# Patient Record
Sex: Female | Born: 1935
Health system: Southern US, Community
[De-identification: ages and names within clinical notes are randomized; demographics above are authoritative.]

## PROBLEM LIST (undated history)

## (undated) DIAGNOSIS — M1990 Unspecified osteoarthritis, unspecified site: Secondary | ICD-10-CM

## (undated) DIAGNOSIS — K219 Gastro-esophageal reflux disease without esophagitis: Secondary | ICD-10-CM

## (undated) DIAGNOSIS — R918 Other nonspecific abnormal finding of lung field: Secondary | ICD-10-CM

## (undated) DIAGNOSIS — E785 Hyperlipidemia, unspecified: Secondary | ICD-10-CM

## (undated) DIAGNOSIS — G4700 Insomnia, unspecified: Secondary | ICD-10-CM

## (undated) DIAGNOSIS — D649 Anemia, unspecified: Secondary | ICD-10-CM

## (undated) DIAGNOSIS — I10 Essential (primary) hypertension: Secondary | ICD-10-CM

## (undated) DIAGNOSIS — E538 Deficiency of other specified B group vitamins: Secondary | ICD-10-CM

## (undated) DIAGNOSIS — J449 Chronic obstructive pulmonary disease, unspecified: Secondary | ICD-10-CM

## (undated) DIAGNOSIS — Z780 Asymptomatic menopausal state: Secondary | ICD-10-CM

## (undated) HISTORY — DX: Other nonspecific abnormal finding of lung field: R91.8

## (undated) HISTORY — DX: Anemia, unspecified: D64.9

## (undated) HISTORY — DX: Asymptomatic menopausal state: Z78.0

## (undated) HISTORY — DX: Unspecified osteoarthritis, unspecified site: M19.90

## (undated) HISTORY — DX: Gastro-esophageal reflux disease without esophagitis: K21.9

## (undated) HISTORY — PX: ABDOMINAL HYSTERECTOMY: SHX81

## (undated) HISTORY — DX: Deficiency of other specified B group vitamins: E53.8

## (undated) HISTORY — DX: Hyperlipidemia, unspecified: E78.5

## (undated) HISTORY — DX: Chronic obstructive pulmonary disease, unspecified: J44.9

## (undated) HISTORY — PX: BREAST BIOPSY: SHX20

## (undated) HISTORY — PX: BREAST EXCISIONAL BIOPSY: SUR124

## (undated) HISTORY — DX: Insomnia, unspecified: G47.00

## (undated) HISTORY — PX: CATARACT EXTRACTION, BILATERAL: SHX1313

---

## 2005-08-11 HISTORY — PX: CARDIOVASCULAR STRESS TEST: SHX262

## 2005-09-09 ENCOUNTER — Ambulatory Visit: Payer: Self-pay | Admitting: Internal Medicine

## 2006-03-10 ENCOUNTER — Ambulatory Visit: Payer: Self-pay | Admitting: Internal Medicine

## 2006-03-12 ENCOUNTER — Ambulatory Visit: Payer: Self-pay | Admitting: Cardiology

## 2006-03-19 ENCOUNTER — Ambulatory Visit: Payer: Self-pay

## 2006-03-27 ENCOUNTER — Ambulatory Visit: Payer: Self-pay | Admitting: Internal Medicine

## 2006-04-01 ENCOUNTER — Ambulatory Visit: Payer: Self-pay | Admitting: Emergency Medicine

## 2006-04-03 ENCOUNTER — Ambulatory Visit (HOSPITAL_COMMUNITY): Admission: RE | Admit: 2006-04-03 | Discharge: 2006-04-03 | Payer: Self-pay | Admitting: Emergency Medicine

## 2006-04-10 ENCOUNTER — Encounter (INDEPENDENT_AMBULATORY_CARE_PROVIDER_SITE_OTHER): Payer: Self-pay | Admitting: *Deleted

## 2006-04-10 ENCOUNTER — Ambulatory Visit: Payer: Self-pay | Admitting: Emergency Medicine

## 2006-04-10 ENCOUNTER — Ambulatory Visit (HOSPITAL_COMMUNITY): Admission: RE | Admit: 2006-04-10 | Discharge: 2006-04-10 | Payer: Self-pay | Admitting: Emergency Medicine

## 2006-04-10 ENCOUNTER — Encounter (INDEPENDENT_AMBULATORY_CARE_PROVIDER_SITE_OTHER): Payer: Self-pay | Admitting: Specialist

## 2006-04-21 ENCOUNTER — Encounter: Payer: Self-pay | Admitting: Internal Medicine

## 2006-05-11 HISTORY — PX: OTHER SURGICAL HISTORY: SHX169

## 2006-05-12 ENCOUNTER — Encounter (INDEPENDENT_AMBULATORY_CARE_PROVIDER_SITE_OTHER): Payer: Self-pay | Admitting: *Deleted

## 2006-05-12 ENCOUNTER — Inpatient Hospital Stay (HOSPITAL_COMMUNITY): Admission: RE | Admit: 2006-05-12 | Discharge: 2006-05-20 | Payer: Self-pay | Admitting: Thoracic Surgery

## 2006-05-12 ENCOUNTER — Ambulatory Visit: Payer: Self-pay | Admitting: Critical Care Medicine

## 2006-05-27 ENCOUNTER — Encounter: Admission: RE | Admit: 2006-05-27 | Discharge: 2006-05-27 | Payer: Self-pay | Admitting: Thoracic Surgery

## 2006-06-03 ENCOUNTER — Encounter: Admission: RE | Admit: 2006-06-03 | Discharge: 2006-06-03 | Payer: Self-pay | Admitting: Thoracic Surgery

## 2006-06-23 ENCOUNTER — Encounter: Admission: RE | Admit: 2006-06-23 | Discharge: 2006-06-23 | Payer: Self-pay | Admitting: Thoracic Surgery

## 2006-07-06 ENCOUNTER — Ambulatory Visit: Payer: Self-pay | Admitting: Internal Medicine

## 2006-08-19 ENCOUNTER — Encounter: Admission: RE | Admit: 2006-08-19 | Discharge: 2006-08-19 | Payer: Self-pay | Admitting: Thoracic Surgery

## 2006-11-17 ENCOUNTER — Ambulatory Visit: Payer: Self-pay | Admitting: Internal Medicine

## 2006-11-17 LAB — CONVERTED CEMR LAB
Basophils Absolute: 0 10*3/uL (ref 0.0–0.1)
Calcium: 9.7 mg/dL (ref 8.4–10.5)
Chloride: 108 meq/L (ref 96–112)
Eosinophils Absolute: 0.3 10*3/uL (ref 0.0–0.6)
GFR calc Af Amer: 157 mL/min
GFR calc non Af Amer: 130 mL/min
Lymphocytes Relative: 16.7 % (ref 12.0–46.0)
MCHC: 34 g/dL (ref 30.0–36.0)
MCV: 87.1 fL (ref 78.0–100.0)
Neutro Abs: 10.5 10*3/uL — ABNORMAL HIGH (ref 1.4–7.7)
Platelets: 331 10*3/uL (ref 150–400)
RBC: 4.93 M/uL (ref 3.87–5.11)
Sodium: 144 meq/L (ref 135–145)
Vitamin B-12: 354 pg/mL (ref 211–911)

## 2006-12-02 ENCOUNTER — Encounter: Admission: RE | Admit: 2006-12-02 | Discharge: 2006-12-02 | Payer: Self-pay | Admitting: Thoracic Surgery

## 2006-12-02 ENCOUNTER — Ambulatory Visit: Payer: Self-pay | Admitting: Thoracic Surgery

## 2006-12-04 ENCOUNTER — Ambulatory Visit: Payer: Self-pay | Admitting: Internal Medicine

## 2006-12-28 ENCOUNTER — Encounter: Payer: Self-pay | Admitting: Internal Medicine

## 2006-12-28 ENCOUNTER — Ambulatory Visit: Payer: Self-pay | Admitting: Internal Medicine

## 2007-03-20 ENCOUNTER — Encounter: Payer: Self-pay | Admitting: Internal Medicine

## 2007-04-05 ENCOUNTER — Ambulatory Visit: Payer: Self-pay | Admitting: Internal Medicine

## 2007-04-05 DIAGNOSIS — R930 Abnormal findings on diagnostic imaging of skull and head, not elsewhere classified: Secondary | ICD-10-CM | POA: Insufficient documentation

## 2007-04-05 DIAGNOSIS — G4700 Insomnia, unspecified: Secondary | ICD-10-CM | POA: Insufficient documentation

## 2007-04-06 ENCOUNTER — Ambulatory Visit: Payer: Self-pay | Admitting: Internal Medicine

## 2007-04-07 LAB — CONVERTED CEMR LAB
Basophils Relative: 0.7 % (ref 0.0–1.0)
CO2: 30 meq/L (ref 19–32)
Calcium: 9.5 mg/dL (ref 8.4–10.5)
Eosinophils Absolute: 0.2 10*3/uL (ref 0.0–0.6)
Eosinophils Relative: 3.6 % (ref 0.0–5.0)
GFR calc Af Amer: 127 mL/min
GFR calc non Af Amer: 105 mL/min
Glucose, Bld: 93 mg/dL (ref 70–99)
HDL: 36.6 mg/dL — ABNORMAL LOW (ref >39.0)
Hemoglobin: 15 g/dL (ref 12.0–15.0)
Lymphocytes Relative: 31.8 % (ref 12.0–46.0)
MCV: 87.2 fL (ref 78.0–100.0)
Monocytes Absolute: 0.5 10*3/uL (ref 0.2–0.7)
Neutro Abs: 3.9 10*3/uL (ref 1.4–7.7)
Neutrophils Relative %: 56.9 % (ref 43.0–77.0)
Platelets: 332 10*3/uL (ref 150–400)
Potassium: 4.6 meq/L (ref 3.5–5.1)
Sodium: 141 meq/L (ref 135–145)
WBC: 6.7 10*3/uL (ref 4.5–10.5)

## 2007-04-29 ENCOUNTER — Encounter: Admission: RE | Admit: 2007-04-29 | Discharge: 2007-04-29 | Payer: Self-pay | Admitting: Internal Medicine

## 2007-04-29 ENCOUNTER — Encounter: Payer: Self-pay | Admitting: Internal Medicine

## 2007-05-03 IMAGING — CR DG CHEST 2V
2 series · 2 of 2 positions shown · non-contrast
Comparison: 05/27/06.

CLINICAL DATA: Status post surgery for lung mass.  
 TWO VIEW CHEST:

[w chest pa]
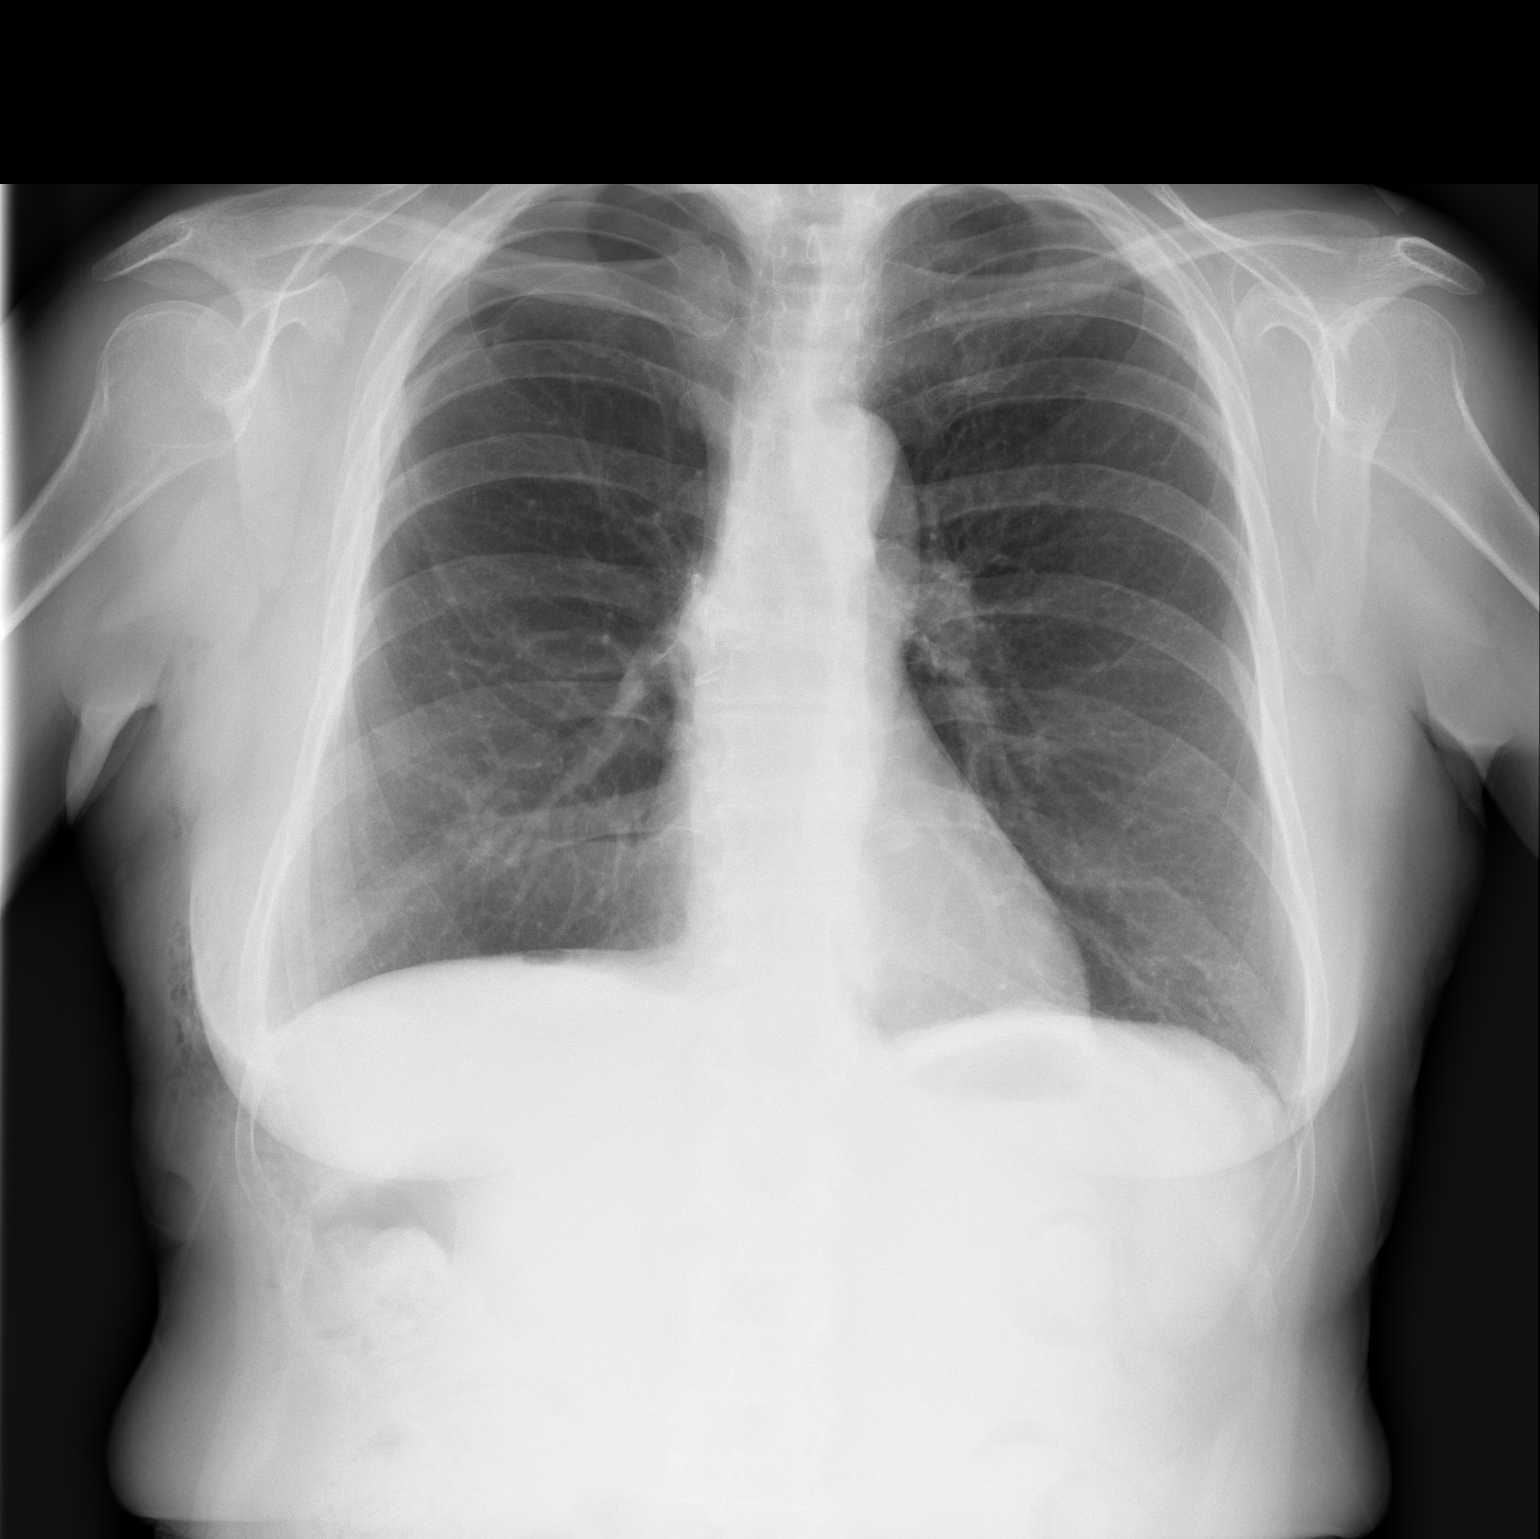

[w chest lat]
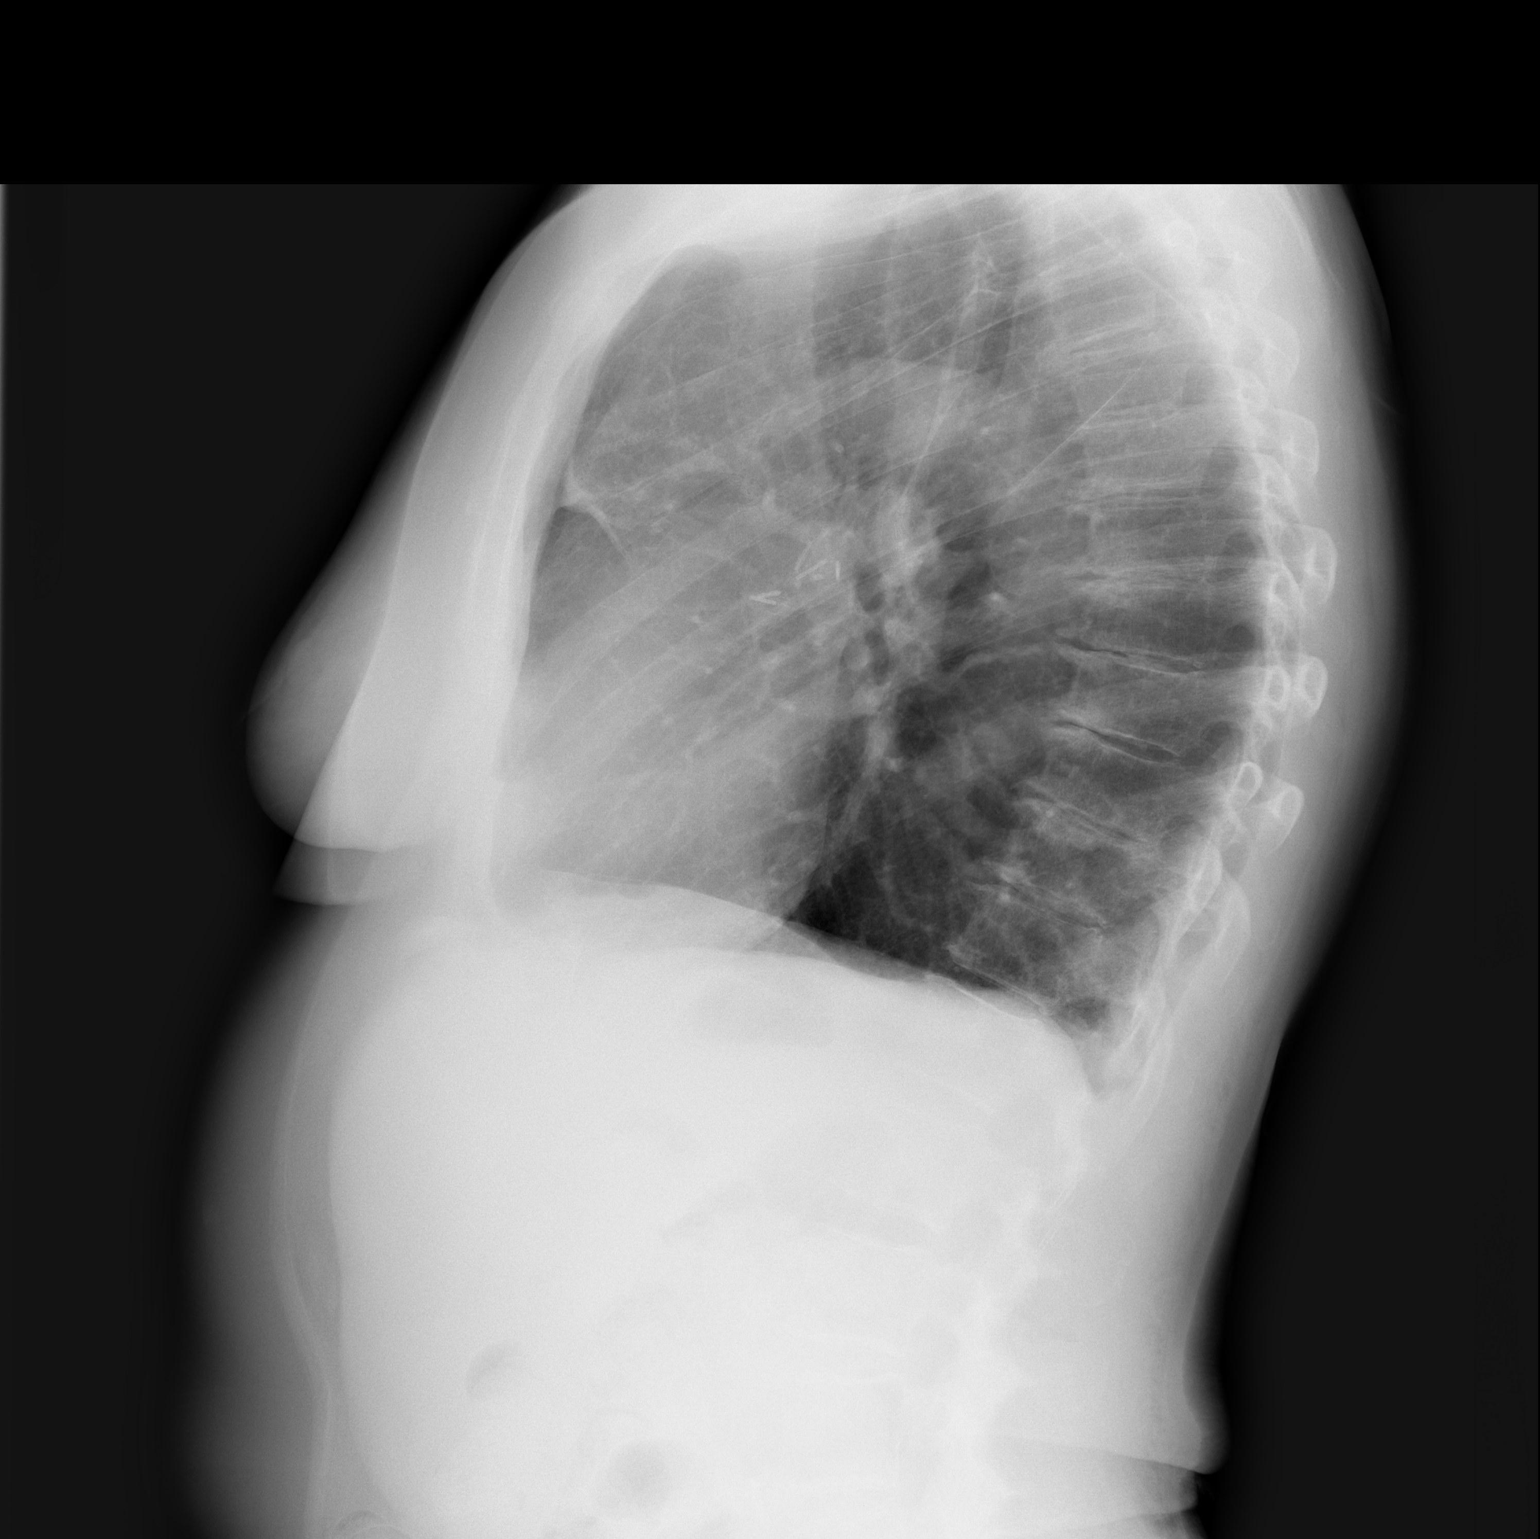

[2 of 2 positions shown; findings below may reference images not displayed]

FINDINGS: Hyperinflation likely relates to COPD.  
 Midline trachea.  Heart size normal.  Left lung clear.  Right apical pneumothorax is decreased in size today.  Visceral pleural line measures maximally 2.6cm from the chest wall today compared to 4.3 at a similar level previously.  There is estimated approximately 10 ? 15% pneumothorax.  There may be a small amount of subpulmonic air.  Decreased right sided subcutaneous air.  Trace right sided pleural effusion or thickening.
IMPRESSION: 1.  Decreased size of a right apical pneumothorax, now estimated at 10 ? 15%.  No mediastinal shift.  Decreased right sided subcutaneous air. 
 2.  COPD.

## 2007-05-05 ENCOUNTER — Encounter: Admission: RE | Admit: 2007-05-05 | Discharge: 2007-05-05 | Payer: Self-pay | Admitting: Internal Medicine

## 2007-05-05 ENCOUNTER — Encounter: Payer: Self-pay | Admitting: Internal Medicine

## 2007-06-07 ENCOUNTER — Ambulatory Visit: Payer: Self-pay | Admitting: Internal Medicine

## 2007-06-07 DIAGNOSIS — M810 Age-related osteoporosis without current pathological fracture: Secondary | ICD-10-CM | POA: Insufficient documentation

## 2007-08-11 ENCOUNTER — Ambulatory Visit: Payer: Self-pay | Admitting: Gastroenterology

## 2007-09-10 ENCOUNTER — Telehealth: Payer: Self-pay | Admitting: Internal Medicine

## 2007-10-06 ENCOUNTER — Ambulatory Visit: Payer: Self-pay | Admitting: Internal Medicine

## 2007-10-06 DIAGNOSIS — M1990 Unspecified osteoarthritis, unspecified site: Secondary | ICD-10-CM

## 2007-10-06 DIAGNOSIS — E785 Hyperlipidemia, unspecified: Secondary | ICD-10-CM

## 2007-10-08 LAB — CONVERTED CEMR LAB
Direct LDL: 173.3 mg/dL
Total CHOL/HDL Ratio: 5.3
Triglycerides: 126 mg/dL (ref 0–149)
VLDL: 25 mg/dL (ref 0–40)

## 2007-11-05 ENCOUNTER — Ambulatory Visit: Payer: Self-pay | Admitting: Internal Medicine

## 2007-11-05 DIAGNOSIS — D518 Other vitamin B12 deficiency anemias: Secondary | ICD-10-CM

## 2008-03-02 ENCOUNTER — Ambulatory Visit: Payer: Self-pay | Admitting: Internal Medicine

## 2008-03-03 ENCOUNTER — Encounter: Payer: Self-pay | Admitting: Internal Medicine

## 2008-03-03 LAB — CONVERTED CEMR LAB
Cholesterol: 233 mg/dL (ref 0–200)
Total CHOL/HDL Ratio: 5.8
Triglycerides: 87 mg/dL (ref 0–149)

## 2008-03-06 ENCOUNTER — Ambulatory Visit: Payer: Self-pay | Admitting: Internal Medicine

## 2008-03-13 ENCOUNTER — Encounter (INDEPENDENT_AMBULATORY_CARE_PROVIDER_SITE_OTHER): Payer: Self-pay | Admitting: *Deleted

## 2008-03-21 ENCOUNTER — Ambulatory Visit: Payer: Self-pay | Admitting: Internal Medicine

## 2008-04-04 ENCOUNTER — Ambulatory Visit: Payer: Self-pay | Admitting: Internal Medicine

## 2008-04-04 ENCOUNTER — Encounter: Payer: Self-pay | Admitting: Internal Medicine

## 2008-04-05 ENCOUNTER — Encounter: Payer: Self-pay | Admitting: Internal Medicine

## 2008-04-28 ENCOUNTER — Ambulatory Visit: Payer: Self-pay | Admitting: Internal Medicine

## 2008-05-11 ENCOUNTER — Encounter (INDEPENDENT_AMBULATORY_CARE_PROVIDER_SITE_OTHER): Payer: Self-pay | Admitting: *Deleted

## 2008-05-24 ENCOUNTER — Telehealth (INDEPENDENT_AMBULATORY_CARE_PROVIDER_SITE_OTHER): Payer: Self-pay | Admitting: *Deleted

## 2008-06-27 ENCOUNTER — Telehealth (INDEPENDENT_AMBULATORY_CARE_PROVIDER_SITE_OTHER): Payer: Self-pay | Admitting: *Deleted

## 2008-06-30 ENCOUNTER — Telehealth (INDEPENDENT_AMBULATORY_CARE_PROVIDER_SITE_OTHER): Payer: Self-pay | Admitting: *Deleted

## 2008-06-30 ENCOUNTER — Ambulatory Visit: Payer: Self-pay | Admitting: Internal Medicine

## 2008-06-30 ENCOUNTER — Encounter: Admission: RE | Admit: 2008-06-30 | Discharge: 2008-06-30 | Payer: Self-pay | Admitting: Internal Medicine

## 2008-07-01 LAB — CONVERTED CEMR LAB
BUN: 15 mg/dL (ref 6–23)
Basophils Absolute: 0 10*3/uL (ref 0.0–0.1)
Basophils Relative: 0.5 % (ref 0.0–3.0)
Chloride: 105 meq/L (ref 96–112)
Creatinine, Ser: 0.7 mg/dL (ref 0.4–1.2)
Glucose, Bld: 97 mg/dL (ref 70–99)
HCT: 43.4 % (ref 36.0–46.0)
Hemoglobin: 14.8 g/dL (ref 12.0–15.0)
Lymphocytes Relative: 30.3 % (ref 12.0–46.0)
MCHC: 34.1 g/dL (ref 30.0–36.0)
Monocytes Absolute: 0.4 10*3/uL (ref 0.1–1.0)
Neutro Abs: 4.1 10*3/uL (ref 1.4–7.7)
Potassium: 3.7 meq/L (ref 3.5–5.1)
RBC: 4.87 M/uL (ref 3.87–5.11)
RDW: 12.3 % (ref 11.5–14.6)
Vitamin B-12: 566 pg/mL (ref 211–911)

## 2008-08-07 ENCOUNTER — Encounter (INDEPENDENT_AMBULATORY_CARE_PROVIDER_SITE_OTHER): Payer: Self-pay | Admitting: *Deleted

## 2008-10-20 ENCOUNTER — Ambulatory Visit: Payer: Self-pay | Admitting: Internal Medicine

## 2008-11-15 ENCOUNTER — Ambulatory Visit: Payer: Self-pay | Admitting: Internal Medicine

## 2009-04-19 ENCOUNTER — Telehealth (INDEPENDENT_AMBULATORY_CARE_PROVIDER_SITE_OTHER): Payer: Self-pay | Admitting: *Deleted

## 2009-05-25 ENCOUNTER — Ambulatory Visit: Payer: Self-pay | Admitting: Internal Medicine

## 2009-05-30 ENCOUNTER — Encounter: Payer: Self-pay | Admitting: Internal Medicine

## 2009-05-30 ENCOUNTER — Ambulatory Visit: Payer: Self-pay | Admitting: Internal Medicine

## 2009-05-31 LAB — CONVERTED CEMR LAB
Cholesterol: 249 mg/dL — ABNORMAL HIGH (ref 0–200)
Direct LDL: 192.5 mg/dL
Total CHOL/HDL Ratio: 6
Triglycerides: 149 mg/dL (ref 0.0–149.0)
VLDL: 29.8 mg/dL (ref 0.0–40.0)

## 2009-06-04 ENCOUNTER — Telehealth: Payer: Self-pay | Admitting: Internal Medicine

## 2009-07-11 ENCOUNTER — Encounter: Payer: Self-pay | Admitting: Internal Medicine

## 2009-07-13 ENCOUNTER — Ambulatory Visit: Payer: Self-pay | Admitting: Internal Medicine

## 2009-07-16 ENCOUNTER — Encounter: Payer: Self-pay | Admitting: Internal Medicine

## 2009-07-17 ENCOUNTER — Telehealth (INDEPENDENT_AMBULATORY_CARE_PROVIDER_SITE_OTHER): Payer: Self-pay | Admitting: *Deleted

## 2009-07-17 ENCOUNTER — Ambulatory Visit: Payer: Self-pay | Admitting: Family

## 2009-07-27 ENCOUNTER — Telehealth: Payer: Self-pay | Admitting: Family

## 2009-07-27 ENCOUNTER — Encounter (HOSPITAL_COMMUNITY): Admission: RE | Admit: 2009-07-27 | Discharge: 2009-08-10 | Payer: Self-pay | Admitting: Internal Medicine

## 2009-07-31 ENCOUNTER — Ambulatory Visit: Payer: Self-pay | Admitting: Internal Medicine

## 2009-08-06 ENCOUNTER — Encounter (INDEPENDENT_AMBULATORY_CARE_PROVIDER_SITE_OTHER): Payer: Self-pay | Admitting: *Deleted

## 2009-08-06 LAB — CONVERTED CEMR LAB
ALT: 17 units/L (ref 0–35)
AST: 21 units/L (ref 0–37)
LDL Cholesterol: 96 mg/dL (ref 0–99)
Total CHOL/HDL Ratio: 4
VLDL: 19.2 mg/dL (ref 0.0–40.0)

## 2009-10-05 ENCOUNTER — Ambulatory Visit: Payer: Self-pay | Admitting: Family

## 2009-10-05 ENCOUNTER — Telehealth (INDEPENDENT_AMBULATORY_CARE_PROVIDER_SITE_OTHER): Payer: Self-pay | Admitting: *Deleted

## 2009-10-18 ENCOUNTER — Ambulatory Visit (HOSPITAL_BASED_OUTPATIENT_CLINIC_OR_DEPARTMENT_OTHER): Admission: RE | Admit: 2009-10-18 | Discharge: 2009-10-18 | Payer: Self-pay | Admitting: Internal Medicine

## 2009-10-18 ENCOUNTER — Ambulatory Visit: Payer: Self-pay | Admitting: Diagnostic Radiology

## 2009-11-23 ENCOUNTER — Ambulatory Visit (HOSPITAL_BASED_OUTPATIENT_CLINIC_OR_DEPARTMENT_OTHER): Admission: RE | Admit: 2009-11-23 | Discharge: 2009-11-23 | Payer: Self-pay | Admitting: Internal Medicine

## 2009-11-23 ENCOUNTER — Ambulatory Visit: Payer: Self-pay | Admitting: Internal Medicine

## 2009-11-23 ENCOUNTER — Ambulatory Visit: Payer: Self-pay | Admitting: Radiology

## 2009-11-26 LAB — CONVERTED CEMR LAB
ALT: 21 units/L (ref 0–35)
AST: 22 units/L (ref 0–37)
BUN: 12 mg/dL (ref 6–23)
Basophils Absolute: 0.2 10*3/uL — ABNORMAL HIGH (ref 0.0–0.1)
Folate: 17.9 ng/mL
GFR calc non Af Amer: 104.27 mL/min (ref >60)
Lymphocytes Relative: 30.5 % (ref 12.0–46.0)
Lymphs Abs: 2.3 10*3/uL (ref 0.7–4.0)
Monocytes Relative: 6.7 % (ref 3.0–12.0)
Neutrophils Relative %: 58 % (ref 43.0–77.0)
Platelets: 369 10*3/uL (ref 150.0–400.0)
Potassium: 4.2 meq/L (ref 3.5–5.1)
RDW: 14.6 % (ref 11.5–14.6)
Sodium: 140 meq/L (ref 135–145)
Vitamin B-12: 600 pg/mL (ref 211–911)
WBC: 7.6 10*3/uL (ref 4.5–10.5)

## 2009-12-14 ENCOUNTER — Telehealth: Payer: Self-pay | Admitting: Internal Medicine

## 2010-05-29 ENCOUNTER — Telehealth: Payer: Self-pay | Admitting: Internal Medicine

## 2010-05-29 ENCOUNTER — Ambulatory Visit: Payer: Self-pay | Admitting: Internal Medicine

## 2010-05-29 DIAGNOSIS — J309 Allergic rhinitis, unspecified: Secondary | ICD-10-CM | POA: Insufficient documentation

## 2010-06-28 ENCOUNTER — Ambulatory Visit: Payer: Self-pay | Admitting: Internal Medicine

## 2010-07-01 LAB — CONVERTED CEMR LAB
AST: 17 units/L (ref 0–37)
CO2: 28 meq/L (ref 19–32)
Chloride: 104 meq/L (ref 96–112)
Cholesterol: 252 mg/dL — ABNORMAL HIGH (ref 0–200)
Creatinine, Ser: 0.6 mg/dL (ref 0.4–1.2)
Direct LDL: 187.6 mg/dL
Total CHOL/HDL Ratio: 5

## 2010-07-10 ENCOUNTER — Telehealth: Payer: Self-pay | Admitting: Internal Medicine

## 2010-07-11 ENCOUNTER — Ambulatory Visit (HOSPITAL_COMMUNITY)
Admission: RE | Admit: 2010-07-11 | Discharge: 2010-07-11 | Payer: Self-pay | Source: Home / Self Care | Admitting: Internal Medicine

## 2010-07-15 ENCOUNTER — Telehealth (INDEPENDENT_AMBULATORY_CARE_PROVIDER_SITE_OTHER): Payer: Self-pay | Admitting: *Deleted

## 2010-07-26 ENCOUNTER — Telehealth (INDEPENDENT_AMBULATORY_CARE_PROVIDER_SITE_OTHER): Payer: Self-pay | Admitting: *Deleted

## 2010-07-30 ENCOUNTER — Telehealth: Payer: Self-pay | Admitting: Internal Medicine

## 2010-09-05 ENCOUNTER — Ambulatory Visit
Admission: RE | Admit: 2010-09-05 | Discharge: 2010-09-05 | Payer: Self-pay | Source: Home / Self Care | Attending: Internal Medicine | Admitting: Internal Medicine

## 2010-09-05 ENCOUNTER — Encounter: Payer: Self-pay | Admitting: Internal Medicine

## 2010-09-05 ENCOUNTER — Other Ambulatory Visit: Payer: Self-pay | Admitting: Internal Medicine

## 2010-09-05 ENCOUNTER — Ambulatory Visit (HOSPITAL_BASED_OUTPATIENT_CLINIC_OR_DEPARTMENT_OTHER)
Admission: RE | Admit: 2010-09-05 | Discharge: 2010-09-05 | Payer: Self-pay | Source: Home / Self Care | Attending: Internal Medicine | Admitting: Internal Medicine

## 2010-09-05 DIAGNOSIS — J441 Chronic obstructive pulmonary disease with (acute) exacerbation: Secondary | ICD-10-CM | POA: Insufficient documentation

## 2010-09-05 LAB — CBC WITH DIFFERENTIAL/PLATELET
Basophils Relative: 0.9 % (ref 0.0–3.0)
Eosinophils Absolute: 0.3 10*3/uL (ref 0.0–0.7)
HCT: 42.4 % (ref 36.0–46.0)
Hemoglobin: 14.4 g/dL (ref 12.0–15.0)
Lymphs Abs: 2.1 10*3/uL (ref 0.7–4.0)
MCHC: 33.9 g/dL (ref 30.0–36.0)
MCV: 87.1 fl (ref 78.0–100.0)
Monocytes Absolute: 0.6 10*3/uL (ref 0.1–1.0)
Neutro Abs: 4.5 10*3/uL (ref 1.4–7.7)
RBC: 4.86 Mil/uL (ref 3.87–5.11)

## 2010-09-05 LAB — AST: AST: 21 U/L (ref 0–37)

## 2010-09-05 LAB — LIPID PANEL: HDL: 42.5 mg/dL (ref >39.00)

## 2010-09-05 LAB — ALT: ALT: 17 U/L (ref 0–35)

## 2010-09-10 NOTE — Progress Notes (Signed)
Summary: refill   Phone Note Refill Request Message from:  Fax from Pharmacy on Dec 14, 2009 8:42 AM  Refills Requested: Medication #1:  CLORAZEPATE DIPOTASSIUM 3.75 MG  TABS 1 or two at bedtime as needed   Notes: no appt pending fax from [facility] - phone [phone]- fax [phone]   Method Requested: Fax to Local Pharmacy Initial call taken by: Okey Regal Spring,  Dec 14, 2009 8:43 AM  Follow-up for Phone Call        denied, got 60x3 11-23-09 Follow-up by: [facility] E. Paz MD,  Dec 14, 2009 9:54 AM  Additional Follow-up for Phone Call Additional follow up Details #1::        pharmacy aware Shary Decamp  Dec 14, 2009 11:01 AM

## 2010-09-10 NOTE — Assessment & Plan Note (Signed)
Summary: CPX/NS/KDC   Vital Signs:  Patient profile:   76 year old female Height:      64.25 inches Weight:      138 pounds BMI:     23.59 Pulse rate:   86 / minute BP sitting:   142 / 90  Vitals Entered By: Shary Decamp (November 23, 2009 9:09 AM) CC: yearly - fasting   History of Present Illness: h/o LUNG MASS -- no routine f/u w/ surgery    COPD-- no cough but SOB sometimes ; SOB is mostly when she starts walking, once she"push through" she can keep walking w/o problmes  Osteoporosis-- s/p reclast 12-10, on vit D   Hyperlipidemia-- took simvastatin from 10-10 to early 2011, self d/c  "I felt bad" (at the time she had persisten cough-URI symptoms , no myalgias per se)  ANEMIA, B12 DEFICIENCY-- gets B12 shot q 2-3 months (gets elsewhere)     yearly checkup, chart reviewed    Current Medications (verified): 1)  Aspir-Low 81 Mg  Tbec (Aspirin) .Marland Kitchen.. 1 By Mouth Once Daily 2)  Clorazepate Dipotassium 3.75 Mg  Tabs (Clorazepate Dipotassium) .Marland Kitchen.. 1 or Two At Bedtime As Needed 3)  Calcium and Vit D Everyday 4)  Fish Oil 1000 Mg Caps (Omega-3 Fatty Acids) .... Take 1 Capsule By Mouth Once A Day 5)  Daily Multi  Tabs (Multiple Vitamins-Minerals) .... 1/2 Tablet Once A Day 6)  Vitamin D3 1000 Unit Tabs (Cholecalciferol) .... Take 1 Tablet By Mouth Once A Day 7)  Vitamin E 400 Unit Caps (Vitamin E) .... Take 1 Tablet By Mouth Once A Day 8)  Mg 250mg   Allergies (verified): 1)  Levaquin  Past History:  Past Medical History: LUNG MASS s/p excision of RUL (Benign) R lung base nodularity , stable per last CXR 5-08 COPD Osteoporosis Osteoarthritis Hyperlipidemia ANEMIA, B12 DEFICIENCY MENOPAUSE, SURGICAL  INSOMNIA, CHRONIC, MILD  h/o COLONIC POLYPS (ICD-211.3) 03-2006 neg cardiolite  Past Surgical History: benign lesion from lung excised 10/07 COMPLETE HYSTERECTOMY 76 Y/O PFTS breast bx , remotely, neg   Family History: MI--father died at age 75 from a heart  attack dementia--M DM--no strokes--no colon ca--no breast ca--M    Social History: Reviewed history from 11/15/2008 and no changes required. Divorced lives by herself has two children, 4 G-kids all boys  totally independent on her ADL  ETOH-- socially Tobacco-- quit in the 37, used to smoke < 1ppd   Review of Systems General:  Denies fever and weight loss. CV:  Denies chest pain or discomfort and swelling of feet. Resp:  Denies coughing up blood and sputum productive. GI:  Denies bloody stools, diarrhea, nausea, and vomiting. GU:  Denies discharge and hematuria.  Physical Exam  General:  alert, well-developed, and well-nourished.   Neck:  no masses and no thyromegaly.   Breasts:  No mass, nodules, thickening, tenderness, bulging, retraction, inflamation, nipple discharge or skin changes noted.   Lungs:  normal respiratory effort, no intercostal retractions, no accessory muscle use, and normal breath sounds.   Heart:  normal rate, regular rhythm, no murmur, and no gallop.   Abdomen:  soft, non-tender, no distention, and no masses.   Extremities:  no edema Psych:  Cognition and judgment appear intact. Alert and cooperative with normal attention span and concentration.  not anxious appearing and not depressed appearing.     Impression & Recommendations:  Problem # 1:  OSTEOPOROSIS (ICD-733.00)  check vitamin D status-post re andclast  infusion 07/2009 Her updated medication list  for this problem includes:    Vitamin D3 1000 Unit Tabs (Cholecalciferol) .Marland Kitchen... Take 1 tablet by mouth once a day  Orders: Venipuncture (04540) T-Vitamin D (25-Hydroxy) (98119-14782)  Problem # 2:  HEALTH MAINTENANCE EXAM (ICD-V70.0) Td 2008 pneumonia shot 2008 had the  shingles shot already   MMG 3-11 neg   does  SBE sometimes  last PAP long ago, has not seen a gyn in a while, her previous doctor told her she does not need PAPs anymore. No h/o abnormal PAPs   Cscope 03-2008 Dr Juanda Chance 2  polyps (Hyperplastic and tubular adenoma)   diet and daily exercise recommended  Problem # 3:  HYPERLIPIDEMIA (ICD-272.4)  she took simvastatin for several weeks, self discontinued it due to respiratory symptoms, doubt they were d/t statins ( she had a  prolonged URI) Recommend to restart simvastatin   Labs Reviewed: SGOT: 21 (07/31/2009)   SGPT: 17 (07/31/2009)   HDL:39.40 (07/31/2009), 40.90 (05/25/2009)  LDL:96 (07/31/2009), DEL (95/62/1308)  Chol:155 (07/31/2009), 249 (05/25/2009)  Trig:96.0 (07/31/2009), 149.0 (05/25/2009)  Her updated medication list for this problem includes:    Simvastatin 20 Mg Tabs (Simvastatin) .Marland Kitchen... As directed  Orders: TLB-BMP (Basic Metabolic Panel-BMET) (80048-METABOL) TLB-ALT (SGPT) (84460-ALT) TLB-AST (SGOT) (84450-SGOT)  Problem # 4:  CHEST XRAY, ABNORMAL (ICD-793.1)  history of a lung mass, former smoker. check a chest x-ray  Orders: T-2 View CXR (71020TC)  Problem # 5:  ANEMIA, B12 DEFICIENCY (ICD-281.1)  she gets B12 shots elsewhere every two to 3 months, labs  Orders: TLB-CBC Platelet - w/Differential (85025-CBCD) TLB-B12 + Folate Pnl (65784_69629-B28/UXL)  Problem # 6:  ELEVATED BLOOD PRESSURE WITHOUT DIAGNOSIS OF HYPERTENSION (ICD-796.2) BP elevated in the last two office visits, seen instructions  Complete Medication List: 1)  Aspir-low 81 Mg Tbec (Aspirin) .Marland Kitchen.. 1 by mouth once daily 2)  Clorazepate Dipotassium 3.75 Mg Tabs (Clorazepate dipotassium) .Marland Kitchen.. 1 or two at bedtime as needed 3)  Calcium and Vit D Everyday  4)  Fish Oil 1000 Mg Caps (Omega-3 fatty acids) .... Take 1 capsule by mouth once a day 5)  Daily Multi Tabs (Multiple vitamins-minerals) .... 1/2 tablet once a day 6)  Vitamin D3 1000 Unit Tabs (Cholecalciferol) .... Take 1 tablet by mouth once a day 7)  Vitamin E 400 Unit Caps (Vitamin e) .... Take 1 tablet by mouth once a day 8)  Mg 250mg   9)  Simvastatin 20 Mg Tabs (Simvastatin) .... As directed  Patient  Instructions: 1)  Please schedule a follow-up appointment in 6 months .  2)  Check your blood pressure 2 or 3 times a week. If it is more than 140/85 consistently,please let us know  Prescriptions: SIMVASTATIN 20 MG TABS (SIMVASTATIN) as directed  #90 x 1   Entered and Authorized by:   Elita Quick E. Anola Mcgough MD   Signed by:   Nolon Rod. Lashaun Poch MD on 11/23/2009   Method used:   Print then Give to Patient   RxID:   [id number] SIMVASTATIN 40 MG TABS (SIMVASTATIN) one daily  #90 x 2   Entered and Authorized by:   Nolon Rod. Valentine Barney MD   Signed by:   Nolon Rod. Gladyes Kudo MD on 11/23/2009   Method used:   Print then Give to Patient   RxID:   [id number] CLORAZEPATE DIPOTASSIUM 3.75 MG  TABS (CLORAZEPATE DIPOTASSIUM) 1 or two at bedtime as needed  #60 x 3   Entered by:   Shary Decamp   Authorized by:   Nolon Rod. Enola Siebers MD  Signed by:   Shary Decamp on 11/23/2009   Method used:   Print then Give to Patient   RxID:   [id number]    Preventive Care Screening  Prior Values:    Mammogram:  ASSESSMENT: Negative - BI-RADS 1^MM DIGITAL SCREENING (10/18/2009)    Colonoscopy:  Location:  [facility].   (04/04/2008)    Last Tetanus Booster:  Td (04/05/2007)    Last Flu Shot:  Fluvax 3+ (05/25/2009)    Last Pneumovax:  Pneumovax (Medicare) (04/05/2007)    Risk Factors: Tobacco use:  quit    Year quit:  1998 Alcohol use:  yes    Drinks per day:  1 Exercise:  yes  Colonoscopy History:    Date of Last Colonoscopy:  04/04/2008  Mammogram History:    Date of Last Mammogram:  10/18/2009  @  [facility]

## 2010-09-10 NOTE — Assessment & Plan Note (Signed)
Summary: sore throat, sinus/alr   Vital Signs:  Patient profile:   76 year old female Weight:      138 pounds BMI:     23.59 Temp:     98.1 degrees F oral Pulse rate:   96 / minute Pulse rhythm:   regular Resp:     16 per minute BP sitting:   138 / 100  (left arm) Cuff size:   regular CC: room 14  sore throat since Sunday night Comments Sore throat since Sunday, not relieved by OTC meds.   CC:  room 14  sore throat since Sunday night.  History of Present Illness: Mary Jordan is a 76 year old female who present with c/o sore throat since Sunday.   Tells me that she also has associated ear pain. She also notes some mild nasal congestion (white nasal discharge).  Notes that she has mild cough.  Denies chest congestion.  Notes that she has felt SOB x greater than 1 year.    Allergies: 1)  Levaquin  Physical Exam  General:  Well-developed,well-nourished,in no acute distress; alert,appropriate and cooperative throughout examination Head:  Normocephalic and atraumatic without obvious abnormalities. No apparent alopecia or balding. Ears:  mild erythema R TM Lungs:  Normal respiratory effort, chest expands symmetrically. Lungs are clear to auscultation, no crackles or wheezes. Heart:  Normal rate and regular rhythm. S1 and S2 normal without gallop, murmur, click, rub or other extra sounds.   Impression & Recommendations:  Problem # 1:  OTITIS MEDIA (ICD-382.9) Assessment New Plan treatment with amoxicillin x 10 days.  Patient instructed to arrange a follow up visit to discuss her chronic dyspnea. The following medications were removed from the medication list:    Augmentin 500-125 Mg Tabs (Amoxicillin-pot clavulanate) ..... One tablet by mouth two times a day x 10 days Her updated medication list for this problem includes:    Aspir-low 81 Mg Tbec (Aspirin) .Marland Kitchen... 1 by mouth once daily    Amoxicillin 500 Mg Cap (Amoxicillin) .Marland Kitchen... Take 1 capsule by mouth three times a day x 10  days  Complete Medication List: 1)  Aspir-low 81 Mg Tbec (Aspirin) .Marland Kitchen.. 1 by mouth once daily 2)  Clorazepate Dipotassium 3.75 Mg Tabs (Clorazepate dipotassium) .Marland Kitchen.. 1 or two at bedtime as needed 3)  Calcium and Vit D Everyday  4)  Fish Oil 1000 Mg Caps (Omega-3 fatty acids) .... Take 1 capsule by mouth once a day 5)  Daily Multi Tabs (Multiple vitamins-minerals) .... 1/2 tablet once a day 6)  Vitamin D3 1000 Unit Tabs (Cholecalciferol) .... Take 1 tablet by mouth once a day 7)  Vitamin E 400 Unit Caps (Vitamin e) .... Take 1 tablet by mouth once a day 8)  Amoxicillin 500 Mg Cap (Amoxicillin) .... Take 1 capsule by mouth three times a day x 10 days  Patient Instructions: 1)  Please arrange a follow up appointment with Dr. Drue Novel to further discuss the long term issues with your breathing. 2)  Call if fever over 101, if your symptoms worsen or if they do not improve.  Prescriptions: AMOXICILLIN 500 MG CAP (AMOXICILLIN) Take 1 capsule by mouth three times a day X 10 days  #30 x 0   Entered and Authorized by:   Lemont Fillers FNP   Signed by:   Lemont Fillers FNP on 10/05/2009   Method used:   Electronically to        [facility] [phone]* (retail)  [address]       [place], [place]  [address]       Ph: [phone]       Fax: [phone]   RxID:   [id number]   Current Allergies (reviewed today): [facility]

## 2010-09-10 NOTE — Progress Notes (Signed)
Summary: patient requested copy of lab  Phone Note Call from Patient   Summary of Call: patient requested copy of lab result - mailed to her  Initial call taken by: Okey Regal Spring,  July 15, 2010 9:27 AM

## 2010-09-10 NOTE — Assessment & Plan Note (Signed)
Summary: 6 month roa//lch   Vital Signs:  Patient profile:   76 year old female Weight:      140.50 pounds Pulse rate:   94 / minute Pulse rhythm:   regular BP sitting:   132 / 82  (left arm) Cuff size:   regular  Vitals Entered By: Army Fossa CMA (May 29, 2010 8:43 AM) CC: 6 month f/u- fasting Comments stopped simvastain wants to wait on flu shot [facility] [address]   History of Present Illness:  6 month f/u - fasting  Osteoporosis--due for reclast 12-11, plans to do it    Hyperlipidemia-- has taken simvastatin sporadically ("1/4 of a tablet now and then") , still very afraid of side effects as far as the diet, she's not following a low-fat diet She continued to be active and exercise routinely  ROS Denies chest pain Occasional shortness of breath Has on-off allergy symptoms, eyes itching, ears congested. Claritin does not help. Options? uses clonazepam as needed only   declined flu shot  at this time, "it makes me feel bad"   Current Medications (verified): 1)  Aspir-Low 81 Mg  Tbec (Aspirin) .Marland Kitchen.. 1 By Mouth Once Daily 2)  Clorazepate Dipotassium 3.75 Mg  Tabs (Clorazepate Dipotassium) .Marland Kitchen.. 1 or Two At Bedtime As Needed 3)  Calcium and Vit D Everyday 4)  Fish Oil 1000 Mg Caps (Omega-3 Fatty Acids) .... Take 1 Capsule By Mouth Once A Day 5)  Vitamin E 400 Unit Caps (Vitamin E) .... Take 1 Tablet By Mouth Once A Day  Allergies (verified): 1)  Levaquin  Past History:  Past Medical History: LUNG MASS s/p excision of RUL (Benign) R lung base nodularity , stable per last CXR 5-08 COPD Osteoporosis Osteoarthritis Hyperlipidemia ANEMIA, B12 DEFICIENCY MENOPAUSE, SURGICAL  INSOMNIA, CHRONIC, MILD  h/o COLONIC POLYPS (ICD-211.3) 03-2006 neg cardiolite Allergic rhinitis  Past Surgical History: Reviewed history from 11/23/2009 and no changes required. benign lesion from lung excised 10/07 COMPLETE HYSTERECTOMY 76 Y/O PFTS breast bx , remotely, neg    Social History: Reviewed history from 11/23/2009 and no changes required. Divorced lives by herself has two children, 4 G-kids all boys  totally independent on her ADL  ETOH-- socially Tobacco-- quit in the 40, used to smoke < 1ppd   Physical Exam  General:  alert, well-developed, and well-nourished.   Lungs:  normal respiratory effort, no intercostal retractions, no accessory muscle use, and normal breath sounds.   Heart:  normal rate, regular rhythm, no murmur, and no gallop.   Extremities:  no edema Psych:  not anxious appearing and not depressed appearing.     Impression & Recommendations:  Problem # 1:  HYPERLIPIDEMIA (ICD-272.4) patient is 76 years old, has + FH of heart dz  and hyperlipidemia Explained  the benefits off primary CAD prevention with statins. Patient is still quite reluctant to take medication, we eventually agreed to: Stop completely simvastatin Recheck FLP in one month to get a new baseline Consider go back to simvastatin 20 mg The following medications were removed from the medication list:    Simvastatin 20 Mg Tabs (Simvastatin) .Marland Kitchen... As directed  Labs Reviewed: SGOT: 22 (11/23/2009)   SGPT: 21 (11/23/2009)   HDL:39.40 (07/31/2009), 40.90 (05/25/2009)  LDL:96 (07/31/2009), DEL (09/81/1914)  Chol:155 (07/31/2009), 249 (05/25/2009)  Trig:96.0 (07/31/2009), 149.0 (05/25/2009)  Problem # 2:  INSOMNIA, CHRONIC, MILD (ICD-307.42) well controlled with clonazepam as needed  Problem # 3:  OSTEOPOROSIS (ICD-733.00) due for reclast  12/11 The following medications were removed  from the medication list:    Vitamin D3 1000 Unit Tabs (Cholecalciferol) .Marland Kitchen... Take 1 tablet by mouth once a day  Problem # 4:  ALLERGIC RHINITIS (ICD-477.9)  declined Flonase Claritin not helping much ----> recommend Zyrtec  Her updated medication list for this problem includes:    Zyrtec Allergy 10 Mg Tbdp (Cetirizine hcl) ..... One over-the-counter tablet daily as needed for  allergies  Complete Medication List: 1)  Aspir-low 81 Mg Tbec (Aspirin) .Marland Kitchen.. 1 by mouth once daily 2)  Clorazepate Dipotassium 3.75 Mg Tabs (Clorazepate dipotassium) .Marland Kitchen.. 1 or two at bedtime as needed 3)  Calcium and Vit D Everyday  4)  Fish Oil 1000 Mg Caps (Omega-3 fatty acids) .... Take 1 capsule by mouth once a day 5)  Vitamin E 400 Unit Caps (Vitamin e) .... Take 1 tablet by mouth once a day 6)  Zyrtec Allergy 10 Mg Tbdp (Cetirizine hcl) .... One over-the-counter tablet daily as needed for allergies  Patient Instructions: 1)  stop simvastatin completely 2)  came back fasting in one month ----> FLP AST ALT ----dx high chol 3)  Don't forget your flu shot in December 4)  Please schedule a follow-up appointment in 3 months, fasting   Orders Added: 1)  Est. Patient Level III [42706]

## 2010-09-10 NOTE — Progress Notes (Signed)
Summary: RECLAST APPOINTMENT?  Phone Note Call from Patient Call back at [facility] Phone [phone]   Caller: Patient Summary of Call: PATIENT SAW DR Dinari Stgermaine TODAY--NOTES STATES SHE IS DUE FOR RECLAST 12/11--"PLANS TO DO IT"     DOES SHE MAKE THAT APPOINTMENT OR DO WE MAKE IT FOR HER??    PLEASE CALL HER AT [phone] Initial call taken by: Jerolyn Shin,  May 29, 2010 10:18 AM  Follow-up for Phone Call        Patient notified that we need labs, and she will do them all at the samttime at appt already schedule for 06-28-10. Follow-up by: Lucious Groves CMA,  May 29, 2010 10:44 AM     Appended Document: RECLAST APPOINTMENT? Patient has an appt @ [facility] on 12.1.11 @ 10am.

## 2010-09-10 NOTE — Progress Notes (Signed)
Summary: SORE THROAT, HARD TO BREATH  Phone Note Call from Patient Call back at Home Phone [phone]   Caller: Patient Call For: Erma E. Paz MD Reason for Call: Talk to Nurse Summary of Call: SYMPTOMS STARTED 09-30-2009.  SORE THROAT, EAR STOPPED UP, UNABLE TO BREATH GOOD.  HAS BEEN USING DAYTIME NON-DROWSY SINUS DECONGESTION, MAXIMUM STRENGTH SINUS & ALLERGY, GIVEN TO HER BY HER PHARMACIST.  I OFFERED PT APPT W/O'[place] AT [facility] OFFICE TODAY, BUT PT DECLINED.  WANTS TO BE SEEN TODAY AT [facility] OFFICE. Initial call taken by: Magdalen Spatz [facility],  October 05, 2009 8:16 AM  Follow-up for Phone Call        spoke with pt who c/o sinus, sore throat; used OTC meds not helping.   Ov scheduled .Kandice Hams  October 05, 2009 9:49 AM  Follow-up by: Kandice Hams,  October 05, 2009 9:49 AM

## 2010-09-10 NOTE — Progress Notes (Signed)
Summary: Reclast prep  Phone Note Call from Patient Call back at Home Phone [phone]   Summary of Call: Patient would like to know what she should do prior to her Reclast appt. Possibly drink water?  Patient was advised to eat normally and drink 2 glasses of fluid, such as water, within a few hours before your appt to prevent kidney problems. Initial call taken by: Lucious Groves CMA,  July 10, 2010 9:02 AM

## 2010-09-12 NOTE — Assessment & Plan Note (Signed)
Summary: 3 MONTH FASTING FOLLOWUP VISIT///SPH   Vital Signs:  Patient profile:   76 year old female Height:      64.25 inches (163.19 cm) Weight:      138.50 pounds (62.95 kg) BMI:     23.67 O2 Sat:      94 % on Room air Temp:     98.2 degrees F (36.78 degrees C) oral Pulse rate:   86 / minute BP sitting:   110 / 68  (right arm) Cuff size:   regular  Vitals Entered By: Lucious Groves CMA (September 05, 2010 9:36 AM)  O2 Flow:  Room air CC: 3 Mo fasting follow up/kb Is Patient Diabetic? No Pain Assessment Patient in pain? no        History of Present Illness: sinus congestion and postnasal dripping for several weeks. She went to urgent care a few days ago , was diagnosed with sinusitis and was prescribed amoxicillin, she will finish her last dose today. She has not improved much She also has noticed that her dyspnea on exertion is  slt more than baseline.  ROS No fever No cough or wheezing per se No sputum production. no chest pain or lower extremity edema She is taking her cholesterol medication, no apparent side effects although from time to time her hands hurt at night    Current Medications (verified): 1)  Aspir-Low 81 Mg  Tbec (Aspirin) .Marland Kitchen.. 1 By Mouth Once Daily 2)  Clorazepate Dipotassium 3.75 Mg  Tabs (Clorazepate Dipotassium) .Marland Kitchen.. 1 or Two At Bedtime As Needed 3)  Calcium and Vit D Everyday 4)  Fish Oil 1000 Mg Caps (Omega-3 Fatty Acids) .... Take 1 Capsule By Mouth Once A Day 5)  Zyrtec Allergy 10 Mg Tbdp (Cetirizine Hcl) .... One Over-The-Counter Tablet Daily As Needed For Allergies 6)  Zocor 10 Mg Tabs (Simvastatin) .Marland Kitchen.. 1 By Mouth At Bedtime.  Allergies (verified): 1)  Levaquin  Past History:  Past Medical History: LUNG MASS s/p excision of RUL (Benign) 2007 R lung base nodularity , stable per last CXR 5-08, CXR 4-11 no nodules  COPD Osteoporosis Osteoarthritis Hyperlipidemia ANEMIA, B12 DEFICIENCY MENOPAUSE, SURGICAL  INSOMNIA, CHRONIC, MILD  h/o  COLONIC POLYPS (ICD-211.3) 03-2006 neg cardiolite Allergic rhinitis  Past Surgical History: Reviewed history from 11/23/2009 and no changes required. benign lesion from lung excised 10/07 COMPLETE HYSTERECTOMY 76 Y/O PFTS breast bx , remotely, neg   Social History: Reviewed history from 11/23/2009 and no changes required. Divorced lives by herself has two children, 4 G-kids all boys  totally independent on her ADL  ETOH-- socially Tobacco-- quit in the 74, used to smoke < 1ppd   Physical Exam  General:  alert and well-developed.  no apparent distress Head:  face symmetric, not tender to palpation Ears:  R ear normal and L ear normal.   Nose:  not congested Mouth:  no red Lungs:  normal respiratory effort, no intercostal retractions, no accessory muscle use, and decreased  breath sounds bilaterally Heart:  normal rate, regular rhythm, no murmur, and no gallop.   Extremities:  no edema   Impression & Recommendations:  Problem # 1:  SINUSITIS, ACUTE (ICD-461.9) Assessment New recently diagnosed with acute sinusitis, finishing amoxicillin. Symptoms not improving. plan: Flonase prednisone for 5 days finishing a 7 day course of amoxicillin, needs at least 10 days  reassess in 2 weeks  Her updated medication list for this problem includes:    Flonase 50 Mcg/act Susp (Fluticasone propionate) .Marland Kitchen... 2 spreys on  each side of the nose daily    Zithromax Z-pak 250 Mg Tabs (Azithromycin) .Marland Kitchen... As directed  Problem # 2:  COPD (ICD-496) history of COPD Has chronic dyspnea, slightly worse in the setting of acute sinusitis She's not coughing wheezing, physical exam showed decreased breath sounds but is otherwise negative. spirometry today--Severe airway obstruction Unclear if increased shortness of breath is related to #1 or not . plan: Labs trial with spiriva  Orders: TLB-CBC Platelet - w/Differential (85025-CBCD) T-2 View CXR (71020TC) Specimen Handling (16109) Spirometry  w/Graph (94010)  Her updated medication list for this problem includes:    Spiriva Handihaler 18 Mcg Caps (Tiotropium bromide monohydrate) ..... One time daily  Problem # 3:  HYPERLIPIDEMIA (ICD-272.4) started medication based on the last cholesterol 06-2010, tolerating well. Her updated medication list for this problem includes:    Zocor 10 Mg Tabs (Simvastatin) .Marland Kitchen... 1 by mouth at bedtime.  Labs Reviewed: SGOT: 17 (06/28/2010)   SGPT: 14 (06/28/2010)   HDL:48.00 (06/28/2010), 39.40 (07/31/2009)  LDL:96 (07/31/2009), DEL (60/45/4098)  Chol:252 (06/28/2010), 155 (07/31/2009)  Trig:125.0 (06/28/2010), 96.0 (07/31/2009)  Orders: Venipuncture (11914) TLB-ALT (SGPT) (84460-ALT) TLB-AST (SGOT) (84450-SGOT) TLB-Lipid Panel (80061-LIPID) Specimen Handling (78295)  Complete Medication List: 1)  Aspir-low 81 Mg Tbec (Aspirin) .Marland Kitchen.. 1 by mouth once daily 2)  Clorazepate Dipotassium 3.75 Mg Tabs (Clorazepate dipotassium) .Marland Kitchen.. 1 or two at bedtime as needed 3)  Calcium and Vit D Everyday  4)  Fish Oil 1000 Mg Caps (Omega-3 fatty acids) .... Take 1 capsule by mouth once a day 5)  Zyrtec Allergy 10 Mg Tbdp (Cetirizine hcl) .... One over-the-counter tablet daily as needed for allergies 6)  Zocor 10 Mg Tabs (Simvastatin) .Marland Kitchen.. 1 by mouth at bedtime. 7)  Flonase 50 Mcg/act Susp (Fluticasone propionate) .... 2 spreys on each side of the nose daily 8)  Spiriva Handihaler 18 Mcg Caps (Tiotropium bromide monohydrate) .... One time daily 9)  Prednisone 20 Mg Tabs (Prednisone) .... One by mouth daily for 5 days 10)  Zithromax Z-pak 250 Mg Tabs (Azithromycin) .... As directed  Patient Instructions: 1)  sinusisitis: 2)  zpack, flonase, prednisone, mucinex two times a day  3)  COPD: 4)  Chest XR 5)  spiriva 6)  Please schedule a follow-up appointment in 2 weeks.  Prescriptions: ZITHROMAX Z-PAK 250 MG TABS (AZITHROMYCIN) as directed  #1 x 0   Entered and Authorized by:   Nolon Rod. Vyom Brass MD   Signed by:    Nolon Rod. Hazleigh Mccleave MD on 09/05/2010   Method used:   [facility]        [facility] [phone]* (retail)       [address]       [place], [place]  [address]       Ph: [phone]       Fax: [phone]   RxID:   [id number] PREDNISONE 20 MG TABS (PREDNISONE) one by mouth daily for 5 days  #5 x 0   Entered and Authorized by:   Nolon Rod. Cammy Sanjurjo MD   Signed by:   Nolon Rod. Timea Breed MD on 09/05/2010   Method used:   [facility] to        [facility] [phone]* (retail)       [address]       [place], [place]  [address]       Ph: [phone]  Fax: [phone]   RxID:   [id number] SPIRIVA HANDIHALER 18 MCG CAPS (TIOTROPIUM BROMIDE MONOHYDRATE) One time daily  #1 month x 3   Entered and Authorized by:   Elita Quick E. Khiya Friese MD   Signed by:   Nolon Rod. Yonis Carreon MD on 09/05/2010   Method used:   [facility] to        [facility] [phone]* (retail)       [address]       [place], [place]  [address]       Ph: [phone]       Fax: [phone]   RxID:   [id number] FLONASE 50 MCG/ACT SUSP (FLUTICASONE PROPIONATE) 2 spreys on each side of the nose daily  #1 x 3   Entered and Authorized by:   Nolon Rod. Achol Azpeitia MD   Signed by:   Nolon Rod. Darnella Zeiter MD on 09/05/2010   Method used:   [facility]ronically to        [facility] [phone]* (retail)       [address]       [place], [place]  [address]       Ph: [phone]       Fax: [phone]   RxID:   [id number]    Orders Added: 1)  Venipuncture [37628] 2)  TLB-ALT (SGPT) [84460-ALT] 3)  TLB-AST (SGOT) [84450-SGOT] 4)  TLB-Lipid Panel [80061-LIPID] 5)  TLB-CBC Platelet - w/Differential [85025-CBCD] 6)  T-2 View CXR [71020TC] 7)  Specimen Handling [99000] 8)  Est. Patient Level IV [31517] 9)  Spirometry w/Graph [61607]

## 2010-09-12 NOTE — Progress Notes (Signed)
Summary: reclast  ---- Converted from flag ---- ---- 07/18/2010 10:34 AM, Army Fossa CMA wrote: waiting on converage to come back from reclast.   ---- 05/29/2010 9:10 AM, Jose E. Paz MD wrote: due for reclast this month, please arrange ------------------------------  I spoke w/ pt and she states that she had her Reclast done the beginning of Dec. Army Fossa CMA  July 26, 2010 8:53 AM

## 2010-09-12 NOTE — Progress Notes (Signed)
Summary: wants antibiotic for head cold  Phone Note Call from Patient Call back at Home Phone [phone]   Caller: Patient Summary of Call: patient has head cold, sinus problems, no fever---going on since last Thursday--tried to explain that, depending on her symptoms, she might need diff antibiotics based on her symptoms and that is why she needed an appointment  Doesnt want to come in for appointment, just wants Dr Drue Novel to [facility] Initial call taken by: Jerolyn Shin,  July 30, 2010 12:02 PM  Follow-up for Phone Call        Please advise. Lucious Groves CMA  July 30, 2010 4:19 PM   Additional Follow-up for Phone Call Additional follow up Details #1::        I recommend: rest, fluids, Tylenol, Robitussin-DM. If symptoms progress, needs to be seen If symptoms severe, needs  to go to the urgent care tonight Additional Follow-up by: [facility] E. Paz MD,  July 30, 2010 4:32 PM    Additional Follow-up for Phone Call Additional follow up Details #2::    Patient notified of the above and stated that she already knew that much and disconnected the call. Lucious Groves CMA  July 30, 2010 4:56 PM

## 2010-09-18 ENCOUNTER — Other Ambulatory Visit: Payer: Self-pay | Admitting: Internal Medicine

## 2010-09-18 DIAGNOSIS — Z139 Encounter for screening, unspecified: Secondary | ICD-10-CM

## 2010-09-19 ENCOUNTER — Encounter: Payer: Self-pay | Admitting: Internal Medicine

## 2010-09-19 ENCOUNTER — Ambulatory Visit (INDEPENDENT_AMBULATORY_CARE_PROVIDER_SITE_OTHER): Payer: Medicare Other | Admitting: Internal Medicine

## 2010-09-19 DIAGNOSIS — J449 Chronic obstructive pulmonary disease, unspecified: Secondary | ICD-10-CM

## 2010-09-19 DIAGNOSIS — E785 Hyperlipidemia, unspecified: Secondary | ICD-10-CM

## 2010-09-19 DIAGNOSIS — M810 Age-related osteoporosis without current pathological fracture: Secondary | ICD-10-CM

## 2010-09-26 NOTE — Assessment & Plan Note (Signed)
Summary: 2 week f/u//kn   Vital Signs:  Patient profile:   76 year old female Weight:      141.38 pounds Pulse rate:   79 / minute Pulse rhythm:   regular BP sitting:   126 / 84  (left arm) Cuff size:   regular  Vitals Entered By: Army Fossa CMA (September 19, 2010 9:06 AM) CC: 2 week f/u- not fasting  Comments " getting better" [facility] [address]   History of Present Illness: F/U doing much better, shortly after the last visit she started to improve   ROS no fever good medication compliance w/  spiriva, cost is an issue no F, no CP reports SOB in the past when she went [place]  the mountains, she had to return to [facility]; wonders if she would ever be able to go back [place] he mountains (in [place])   Current Medications (verified): 1)  Aspir-Low 81 Mg  Tbec (Aspirin) .Marland Kitchen.. 1 By Mouth Once Daily 2)  Clorazepate Dipotassium 3.75 Mg  Tabs (Clorazepate Dipotassium) .Marland Kitchen.. 1 or Two At Bedtime As Needed 3)  Calcium and Vit D Everyday 4)  Fish Oil 1000 Mg Caps (Omega-3 Fatty Acids) .... Take 1 Capsule By Mouth Once A Day 5)  Zyrtec Allergy 10 Mg Tbdp (Cetirizine Hcl) .... One Over-The-Counter Tablet Daily As Needed For Allergies 6)  Zocor 10 Mg Tabs (Simvastatin) .Marland Kitchen.. 1 By Mouth At Bedtime. 7)  Flonase 50 Mcg/act Susp (Fluticasone Propionate) .... 2 Spreys On Each Side of The Nose Daily 8)  Spiriva Handihaler 18 Mcg Caps (Tiotropium Bromide Monohydrate) .... One Time Daily  Allergies (verified): 1)  Levaquin  Past History:  Past Medical History: Reviewed history from 09/05/2010 and no changes required. LUNG MASS s/p excision of RUL (Benign) 2007 R lung base nodularity , stable per last CXR 5-08, CXR 4-11 no nodules  COPD Osteoporosis Osteoarthritis Hyperlipidemia ANEMIA, B12 DEFICIENCY MENOPAUSE, SURGICAL  INSOMNIA, CHRONIC, MILD  h/o COLONIC POLYPS (ICD-211.3) 03-2006 neg cardiolite Allergic rhinitis  Past Surgical History: Reviewed history from 11/23/2009 and no changes  required. benign lesion from lung excised 10/07 COMPLETE HYSTERECTOMY 76 Y/O PFTS breast bx , remotely, neg   Social History: Reviewed history from 11/23/2009 and no changes required. Divorced lives by herself has two children, 4 G-kids all boys  totally independent on her ADL  ETOH-- socially Tobacco-- quit in the 89, used to smoke < 1ppd   Physical Exam  General:  alert, well-developed, and well-nourished.   Lungs:  normal respiratory effort, no intercostal retractions, no accessory muscle use, and decreased  breath sounds bilaterally Heart:  normal rate, regular rhythm, no murmur, and no gallop.     Impression & Recommendations:  Problem # 1:  COPD (ICD-496) acute exacerbation  resolved. see HPI, she [place] the [place] due to shortness of breath, likely due to hypoxia. O2 Sat today 95% resting, 94% after exertion She may need supplemental oxygen whenever she attempts to go to the mountains although overall she is better than even a few months ago (likely b/c she is now on spiriva) if she ever likes to take an airplane, I would recommend oxygen  Her updated medication list for this problem includes:    Spiriva Handihaler 18 Mcg Caps (Tiotropium bromide monohydrate) ..... One time daily  Problem # 2:  OSTEOPOROSIS (ICD-733.00) had a  reclast  12/11 per patient ( no report from the hospital found) The following medications were removed from the medication list:  Vitamin D3 1000 Unit Tabs (Cholecalciferol) .Marland Kitchen... Take 1 tablet by mouth once a day  Problem # 3:  HYPERLIPIDEMIA (ICD-272.4) last FLP not at goal, reports poor compliance w/ meds prior to the test we agreed  to stay on 10 mg and recheck on RTC Her updated medication list for this problem includes:    Zocor 10 Mg Tabs (Simvastatin) .Marland Kitchen... 1 by mouth at bedtime.  Labs Reviewed: SGOT: 21 (09/05/2010)   SGPT: 17 (09/05/2010)   HDL:42.50 (09/05/2010), 48.00 (06/28/2010)  LDL:96 (07/31/2009), DEL  (03/02/2008)  Chol:207 (09/05/2010), 252 (06/28/2010)  Trig:133.0 (09/05/2010), 125.0 (06/28/2010)  Complete Medication List: 1)  Aspir-low 81 Mg Tbec (Aspirin) .Marland Kitchen.. 1 by mouth once daily 2)  Clorazepate Dipotassium 3.75 Mg Tabs (Clorazepate dipotassium) .Marland Kitchen.. 1 or two at bedtime as needed 3)  Calcium and Vit D Everyday  4)  Fish Oil 1000 Mg Caps (Omega-3 fatty acids) .... Take 1 capsule by mouth once a day 5)  Zyrtec Allergy 10 Mg Tbdp (Cetirizine hcl) .... One over-the-counter tablet daily as needed for allergies 6)  Zocor 10 Mg Tabs (Simvastatin) .Marland Kitchen.. 1 by mouth at bedtime. 7)  Flonase 50 Mcg/act Susp (Fluticasone propionate) .... 2 spreys on each side of the nose daily 8)  Spiriva Handihaler 18 Mcg Caps (Tiotropium bromide monohydrate) .... One time daily  Patient Instructions: 1)  Please schedule a follow-up appointment in 3 to 4 months .    Orders Added: 1)  Est. Patient Level III [16109]    Prevention & Chronic Care Immunizations   Influenza vaccine: Fluvax 3+  (06/28/2010)    Tetanus booster: 04/05/2007: Td    Pneumococcal vaccine: Pneumovax (Medicare)  (04/05/2007)    H. zoster vaccine: 11/05/2007: Zostavax  Colorectal Screening   Hemoccult: Not documented    Colonoscopy: Location:  [facility].    (04/04/2008)   Colonoscopy due: 04/2011  Other Screening   Pap smear: Not documented    Mammogram: ASSESSMENT: Negative - BI-RADS 1^MM DIGITAL SCREENING  (10/18/2009)    DXA bone density scan: Not documented   Smoking status: quit  (11/15/2008)  Lipids   Total Cholesterol: 207  (09/05/2010)   LDL: 96  (07/31/2009)   LDL Direct: 148.8  (09/05/2010)   HDL: 42.50  (09/05/2010)   Triglycerides: 133.0  (09/05/2010)    SGOT (AST): 21  (09/05/2010)   SGPT (ALT): 17  (09/05/2010)   Alkaline phosphatase: Not documented   Total bilirubin: Not documented  Self-Management Support :    Lipid self-management support: Not documented

## 2010-10-11 ENCOUNTER — Ambulatory Visit (INDEPENDENT_AMBULATORY_CARE_PROVIDER_SITE_OTHER): Payer: Medicare Other | Admitting: Internal Medicine

## 2010-10-11 ENCOUNTER — Encounter: Payer: Self-pay | Admitting: Internal Medicine

## 2010-10-11 DIAGNOSIS — S46819A Strain of other muscles, fascia and tendons at shoulder and upper arm level, unspecified arm, initial encounter: Secondary | ICD-10-CM

## 2010-10-14 ENCOUNTER — Ambulatory Visit (INDEPENDENT_AMBULATORY_CARE_PROVIDER_SITE_OTHER): Payer: Medicare Other | Admitting: Family Medicine

## 2010-10-14 ENCOUNTER — Encounter: Payer: Self-pay | Admitting: Family Medicine

## 2010-10-14 DIAGNOSIS — M5412 Radiculopathy, cervical region: Secondary | ICD-10-CM | POA: Insufficient documentation

## 2010-10-17 NOTE — Assessment & Plan Note (Signed)
Summary: shoulder pain/cdj   Vital Signs:  Patient profile:   76 year old female Weight:      146 pounds Temp:     98.2 degrees F oral Pulse rate:   88 / minute Pulse rhythm:   regular BP sitting:   128 / 84  (left arm) Cuff size:   regular  Vitals Entered By: Army Fossa CMA (October 11, 2010 11:24 AM) CC: Pt here c/o (L) arm and shoulder pain Comments started Monday  taking ibruprofen [facility] [place]    History of Present Illness:  4 days history of pain in the left arm, located mostly in the tricipital area. The pain is steady and does not change with use of the arm.  ice, ibuprofen have not helped much.     ROS Denies any injury, no overuse except for the fact that she started to use the [place]  last week. Denies any neck pain, actual shoulder pain or tingling in the hands  Current Medications (verified): 1)  Aspir-Low 81 Mg  Tbec (Aspirin) .Marland Kitchen.. 1 By Mouth Once Daily 2)  Clorazepate Dipotassium 3.75 Mg  Tabs (Clorazepate Dipotassium) .Marland Kitchen.. 1 or Two At Bedtime As Needed 3)  Calcium and Vit D Everyday 4)  Fish Oil 1000 Mg Caps (Omega-3 Fatty Acids) .... Take 1 Capsule By Mouth Once A Day 5)  Zyrtec Allergy 10 Mg Tbdp (Cetirizine Hcl) .... One Over-The-Counter Tablet Daily As Needed For Allergies 6)  Zocor 10 Mg Tabs (Simvastatin) .Marland Kitchen.. 1 By Mouth At Bedtime. 7)  Spiriva Handihaler 18 Mcg Caps (Tiotropium Bromide Monohydrate) .... One Time Daily  Allergies (verified): 1)  Levaquin  Past History:  Past Medical History: Reviewed history from 09/05/2010 and no changes required. LUNG MASS s/p excision of RUL (Benign) 2007 R lung base nodularity , stable per last CXR 5-08, CXR 4-11 no nodules  COPD Osteoporosis Osteoarthritis Hyperlipidemia ANEMIA, B12 DEFICIENCY MENOPAUSE, SURGICAL  INSOMNIA, CHRONIC, MILD  h/o COLONIC POLYPS (ICD-211.3) 03-2006 neg cardiolite Allergic rhinitis  Past Surgical History: Reviewed history from 11/23/2009 and no changes required. benign  lesion from lung excised 10/07 COMPLETE HYSTERECTOMY 76 Y/O PFTS breast bx , remotely, neg   Social History: Reviewed history from 11/23/2009 and no changes required. Divorced lives by herself has two children, 4 G-kids all boys  totally independent on her ADL  ETOH-- socially Tobacco-- quit in the 73, used to smoke < 1ppd   Physical Exam  General:  alert and well-developed.   Neck:   no tender to palpation on the cervical spine area Extremities:   both shoulders are normal in the range of motion and without pain  right arm normal ,  biceps normal  left arm and elbow without deformities  , biceps normal;  slightly tender to palpation at the tricipital external distal area , no swelling or redness     Impression & Recommendations:  Problem # 1:  SPRAIN&STRAIN OTH SPEC SITES SHOULDER&UPPER ARM (ICD-840.8)  arm sprain see Instructions,  somehow reluctant to take prednisone If no better, will refer to ortho  Complete Medication List: 1)  Aspir-low 81 Mg Tbec (Aspirin) .Marland Kitchen.. 1 by mouth once daily 2)  Clorazepate Dipotassium 3.75 Mg Tabs (Clorazepate dipotassium) .Marland Kitchen.. 1 or two at bedtime as needed 3)  Calcium and Vit D Everyday  4)  Fish Oil 1000 Mg Caps (Omega-3 fatty acids) .... Take 1 capsule by mouth once a day 5)  Zyrtec Allergy 10 Mg Tbdp (Cetirizine hcl) .... One over-the-counter tablet daily  as needed for allergies 6)  Zocor 10 Mg Tabs (Simvastatin) .Marland Kitchen.. 1 by mouth at bedtime. 7)  Spiriva Handihaler 18 Mcg Caps (Tiotropium bromide monohydrate) .... One time daily 8)  Prednisone 10 Mg Tabs (Prednisone) .... 3 by mouth once daily x 3, 2 by mouth once daily x 3, 1 by mouth once daily x 3  Patient Instructions: 1)  ice  2)  rest the arm 3)  prednisone as prescribed 4)  tylenol 500mg  2 tabs every 6 hours as needed pain, no more than 8 tablets a day 5)  call if no better in 2 weeks  Prescriptions: PREDNISONE 10 MG TABS (PREDNISONE) 3 by mouth once daily x 3, 2 by mouth once  daily x 3, 1 by mouth once daily x 3  #18 x 0   Entered and Authorized by:   Elita Quick E. Rhylen Pulido MD   Signed by:   Nolon Rod. Verbie Babic MD on 10/11/2010   Method used:   Print then Give to Patient   RxID:   [id number]    Orders Added: 1)  Est. Patient Level III [74259]

## 2010-10-21 ENCOUNTER — Ambulatory Visit: Payer: Medicare Other | Attending: Family Medicine | Admitting: Physical Therapy

## 2010-10-21 ENCOUNTER — Ambulatory Visit (HOSPITAL_BASED_OUTPATIENT_CLINIC_OR_DEPARTMENT_OTHER)
Admission: RE | Admit: 2010-10-21 | Discharge: 2010-10-21 | Disposition: A | Discharge status: Home / Self Care | Payer: Medicare Other | Source: Ambulatory Visit | Attending: Internal Medicine | Admitting: Internal Medicine

## 2010-10-21 DIAGNOSIS — M25519 Pain in unspecified shoulder: Secondary | ICD-10-CM | POA: Insufficient documentation

## 2010-10-21 DIAGNOSIS — M542 Cervicalgia: Secondary | ICD-10-CM | POA: Insufficient documentation

## 2010-10-21 DIAGNOSIS — R293 Abnormal posture: Secondary | ICD-10-CM | POA: Insufficient documentation

## 2010-10-21 DIAGNOSIS — IMO0001 Reserved for inherently not codable concepts without codable children: Secondary | ICD-10-CM | POA: Insufficient documentation

## 2010-10-21 DIAGNOSIS — Z139 Encounter for screening, unspecified: Secondary | ICD-10-CM

## 2010-10-21 DIAGNOSIS — Z1231 Encounter for screening mammogram for malignant neoplasm of breast: Secondary | ICD-10-CM | POA: Insufficient documentation

## 2010-10-21 DIAGNOSIS — M2569 Stiffness of other specified joint, not elsewhere classified: Secondary | ICD-10-CM | POA: Insufficient documentation

## 2010-10-22 NOTE — Assessment & Plan Note (Signed)
Summary: LEFT ARM PAIN/NP/LP  # [id number]   Vital Signs:  Patient profile:   76 year old female Height:      64.25 inches (163.19 cm) Weight:      147.0 pounds (66.82 kg) BMI:     25.13 Temp:     97.4 degrees F (36.33 degrees C) oral Pulse rate:   90 / minute BP sitting:   126 / 87  (right arm)  Vitals Entered By: Baxter Hire) (October 14, 2010 9:35 AM) CC: left arm pain Pain Assessment Patient in pain? yes     Location: left arm Intensity: 10 Onset of pain  pain comes and goes/starts in shoulder-radiates downward/pain x 1 week Nutritional Status BMI of 25 - 29 = overweight  Does patient need assistance? Functional Status Self care Ambulation Normal   Primary Care Provider:  Nolon Rod. Paz MD  CC:  left arm pain.  History of Present Illness: 76 yo F here for left arm pain  Patient denies any known injury States about 1 week ago developed slowly worsening left posterior shoulder pain that radiates down past her left elbow. No numbness or tingling. No known neck pain Is right handed No prior issues with shoulder or neck. Pain constant - not worse with movements of left arm. Tried heat and pain medicines - some improvement but not lasting. Swims at [facility] and is active but has not changed routine. + night pain  Habits & Providers  Alcohol-Tobacco-Diet     Alcohol drinks/day: 1     Tobacco Status: quit     Year Quit: 1998  Current Problems (verified): 1)  Sprain&strain Oth Spec Sites Shoulder&upper Arm  (ICD-840.8) 2)  COPD  (ICD-496) 3)  Allergic Rhinitis  (ICD-477.9) 4)  Anemia, B12 Deficiency  (ICD-281.1) 5)  Hyperlipidemia  (ICD-272.4) 6)  Osteoarthritis  (ICD-715.90) 7)  Osteoporosis  (ICD-733.00) 8)  Chest Xray, Abnormal  (ICD-793.1) 9)  Insomnia, Chronic, Mild  (ICD-307.42) 10)  Health Maintenance Exam  (ICD-V70.0)  Current Medications (verified): 1)  Aspir-Low 81 Mg  Tbec (Aspirin) .Marland Kitchen.. 1 By Mouth Once Daily 2)  Clorazepate Dipotassium 3.75 Mg   Tabs (Clorazepate Dipotassium) .Marland Kitchen.. 1 or Two At Bedtime As Needed 3)  Calcium and Vit D Everyday 4)  Fish Oil 1000 Mg Caps (Omega-3 Fatty Acids) .... Take 1 Capsule By Mouth Once A Day 5)  Zyrtec Allergy 10 Mg Tbdp (Cetirizine Hcl) .... One Over-The-Counter Tablet Daily As Needed For Allergies 6)  Zocor 10 Mg Tabs (Simvastatin) .Marland Kitchen.. 1 By Mouth At Bedtime. 7)  Spiriva Handihaler 18 Mcg Caps (Tiotropium Bromide Monohydrate) .... One Time Daily 8)  Nortriptyline Hcl 25 Mg Caps (Nortriptyline Hcl) .Marland Kitchen.. 1 Cap By Mouth At Bedtime For Nerve Pain 9)  Vicodin 5-500 Mg Tabs (Hydrocodone-Acetaminophen) .Marland Kitchen.. 1 Tab By Mouth Q6h As Needed Severe Pain  Allergies: 1)  Levaquin  Family History: Reviewed history from 11/23/2009 and no changes required. MI--father died at age 59 from a heart attack dementia--M DM--no strokes--no colon ca--no breast ca--M    Social History: Reviewed history from 11/23/2009 and no changes required. Divorced lives by herself has two children, 4 G-kids all boys  totally independent on her ADL  ETOH-- socially Tobacco-- quit in the 83, used to smoke < 1ppd   Physical Exam  General:  alert and well-developed.   Msk:  Neck: No gross deformity, swelling, or bruising. No focal midline or bony TTP.  Mild TTP left trapezius, no ttp R trap or paraspinal  muscles. 5 degree limitation in extension and bilateral lateral rotation.  Pain reproduced in left upper arm with left lateral rotation. + spurlings on left, negative on right. Strength 4/5 L triceps, 5/5 all other BUE muscle groups. Sensation intact to light touch BUEs. MSRs 1+ L triceps, 2+ R triceps, bilateral biceps and brachioradialis tendons.  L shoulder: No gross deformity, swelling, bruising, erythema. Slightly less upper arm bulk on left vs right. TTP throughout posterior shoulder blade, triceps, biceps, to just past elbow. No focal bony TTP. FROM without painful arc. Strength 5/5 with empty can, resisted  IR/ER and no pain with these motions negative hawkins and neers Negative yergasons Negative apprehension.   Impression & Recommendations:  Problem # 1:  CERVICAL RADICULOPATHY, LEFT (ICD-723.4) Assessment New Patient's symptoms more indicative of neck pathology, radiculopathy than shoulder issue - negative shoulder exam today.  Denies wanting to try prednisone which may help her most (lots of hunger and weight gain on this previously).  Instead will take aleve regularly, heat, start physical therapy for traction, stim, ionto, ultrasound up to 6 weeks.  Start nortriptyline for neuropathic pain (neurontin an option as well) - weight gain possible side effect but much less likely than with the prednisone - start low dose and increase if necessary.  If not improving will move forward with radiographs and MRI, consider ESIs depending on findings.  F/u in 3 weeks for reevaluation.  Complete Medication List: 1)  Aspir-low 81 Mg Tbec (Aspirin) .Marland Kitchen.. 1 by mouth once daily 2)  Clorazepate Dipotassium 3.75 Mg Tabs (Clorazepate dipotassium) .Marland Kitchen.. 1 or two at bedtime as needed 3)  Calcium and Vit D Everyday  4)  Fish Oil 1000 Mg Caps (Omega-3 fatty acids) .... Take 1 capsule by mouth once a day 5)  Zyrtec Allergy 10 Mg Tbdp (Cetirizine hcl) .... One over-the-counter tablet daily as needed for allergies 6)  Zocor 10 Mg Tabs (Simvastatin) .Marland Kitchen.. 1 by mouth at bedtime. 7)  Spiriva Handihaler 18 Mcg Caps (Tiotropium bromide monohydrate) .... One time daily 8)  Nortriptyline Hcl 25 Mg Caps (Nortriptyline hcl) .Marland Kitchen.. 1 cap by mouth at bedtime for nerve pain 9)  Vicodin 5-500 Mg Tabs (Hydrocodone-acetaminophen) .Marland Kitchen.. 1 tab by mouth q6h as needed severe pain  Patient Instructions: 1)  Your exam and history are consistent with a C7 or C8 cervical radiculopathy (pinched nerve in lower part of your neck). 2)  Start physical therapy for the next 4 weeks. 3)  Aleve 1-2 tabs twice a day with food x 7 days then as  needed. 4)  Nortriptyline 25mg  at bedtime - can make you sleepy. 5)  In meantime you can take vicodin as needed for severe pain - no driving on this medicine. 6)  Prednisone is an option as well (I know you're not excited about this option though). 7)  Heat as needed for 15 minutes at a time. 8)  If not improving we will consider x-rays and an MRI of your neck. 9)  Follow up with me in 3 weeks. Prescriptions: VICODIN 5-500 MG TABS (HYDROCODONE-ACETAMINOPHEN) 1 tab by mouth q6h as needed severe pain  #40 x 0   Entered and Authorized by:   Norton Blizzard MD   Signed by:   Norton Blizzard MD on 10/14/2010   Method used:   Print then Give to Patient   RxID:   [id number] NORTRIPTYLINE HCL 25 MG CAPS (NORTRIPTYLINE HCL) 1 cap by mouth at bedtime for nerve pain  #30 x 1   Entered  and Authorized by:   Norton Blizzard MD   Signed by:   Norton Blizzard MD on 10/14/2010   Method used:   Print then Give to Patient   RxID:   [id number]    Orders Added: 1)  New Patient Level III [99203]

## 2010-10-23 ENCOUNTER — Ambulatory Visit: Payer: Medicare Other | Admitting: Physical Therapy

## 2010-10-28 ENCOUNTER — Ambulatory Visit: Payer: Medicare Other | Admitting: Physical Therapy

## 2010-10-28 ENCOUNTER — Encounter: Payer: Self-pay | Admitting: Family Medicine

## 2010-10-28 ENCOUNTER — Ambulatory Visit (INDEPENDENT_AMBULATORY_CARE_PROVIDER_SITE_OTHER): Payer: Medicare Other | Admitting: Family Medicine

## 2010-10-28 DIAGNOSIS — M5412 Radiculopathy, cervical region: Secondary | ICD-10-CM

## 2010-10-30 ENCOUNTER — Ambulatory Visit: Payer: Medicare Other | Admitting: Physical Therapy

## 2010-11-06 ENCOUNTER — Ambulatory Visit: Payer: Medicare Other | Admitting: Physical Therapy

## 2010-11-07 NOTE — Assessment & Plan Note (Signed)
Summary: F/U/LP   Vital Signs:  Patient profile:   76 year old female Height:      63 inches (160.02 cm) Weight:      141.2 pounds (64.18 kg) BMI:     25.10 Temp:     98.1 degrees F (36.72 degrees C) oral Pulse rate:   105 / minute BP sitting:   99 / 73  (left arm)  Vitals Entered By: Baxter Hire) (October 28, 2010 1:37 PM) CC: L Arm Pain / follow up visit Pain Assessment Patient in pain? no      Nutritional Status BMI of 25 - 29 = overweight  Does patient need assistance? Functional Status Self care Ambulation Normal   Primary Care Provider:  Nolon Rod. Paz MD  CC:  L Arm Pain / follow up visit.  History of Present Illness: 76 yo F here for 2 week f/u L cervical radiculopathy  Patient denied any known injury States about 1 week prior to last visit developed slowly worsening left posterior shoulder pain that radiated down past her left elbow. No numbness or tingling. No known neck pain Is right handed No prior issues with shoulder or neck. Pain constant - not worse with movements of left arm. Tried heat and pain medicines - some improvement but not lasting. Swims at [facility] and is active but has not changed routine. + night pain  At last OV was placed on aleve, nortriptyline, started in PT, using heat Patient reports has significantly improved. Continuing with PT and doing well Only took nortriptyline once b/c weight gain was possible side effect no other complaints.  Habits & Providers  Alcohol-Tobacco-Diet     Alcohol drinks/day: 1     Tobacco Status: quit     Year Quit: 1998  Current Problems (verified): 1)  Cervical Radiculopathy, Left  (ICD-723.4) 2)  Sprain&strain Oth Spec Sites Shoulder&upper Arm  (ICD-840.8) 3)  COPD  (ICD-496) 4)  Allergic Rhinitis  (ICD-477.9) 5)  Anemia, B12 Deficiency  (ICD-281.1) 6)  Hyperlipidemia  (ICD-272.4) 7)  Osteoarthritis  (ICD-715.90) 8)  Osteoporosis  (ICD-733.00) 9)  Chest Xray, Abnormal  (ICD-793.1) 10)   Insomnia, Chronic, Mild  (ICD-307.42) 11)  Health Maintenance Exam  (ICD-V70.0)  Current Medications (verified): 1)  Aspir-Low 81 Mg  Tbec (Aspirin) .Marland Kitchen.. 1 By Mouth Once Daily 2)  Clorazepate Dipotassium 3.75 Mg  Tabs (Clorazepate Dipotassium) .Marland Kitchen.. 1 or Two At Bedtime As Needed 3)  Calcium and Vit D Everyday 4)  Fish Oil 1000 Mg Caps (Omega-3 Fatty Acids) .... Take 1 Capsule By Mouth Once A Day 5)  Zyrtec Allergy 10 Mg Tbdp (Cetirizine Hcl) .... One Over-The-Counter Tablet Daily As Needed For Allergies 6)  Zocor 10 Mg Tabs (Simvastatin) .Marland Kitchen.. 1 By Mouth At Bedtime. 7)  Spiriva Handihaler 18 Mcg Caps (Tiotropium Bromide Monohydrate) .... One Time Daily 8)  Nortriptyline Hcl 25 Mg Caps (Nortriptyline Hcl) .Marland Kitchen.. 1 Cap By Mouth At Bedtime For Nerve Pain 9)  Vicodin 5-500 Mg Tabs (Hydrocodone-Acetaminophen) .Marland Kitchen.. 1 Tab By Mouth Q6h As Needed Severe Pain  Allergies: 1)  Levaquin  Physical Exam  General:  alert and well-developed.   Msk:  Neck: No gross deformity, swelling, or bruising. No focal midline or bony TTP.  No paraspinal TTP 5 degree limitation in extension and bilateral lateral rotation.  No pain with neck rotation. Now negative spurlings bilaterally Strength 4+/5 L triceps, 5/5 all other BUE muscle groups. Sensation intact to light touch BUEs. MSRs 2+ L triceps, 2+ R triceps,  bilateral biceps and brachioradialis tendons.  L shoulder: No gross deformity, swelling, bruising, erythema. Slightly less upper arm bulk on left vs right. No TTP FROM without painful arc. Strength 5/5 with empty can, resisted IR/ER and no pain with these motions negative hawkins   Impression & Recommendations:  Problem # 1:  CERVICAL RADICULOPATHY, LEFT (ICD-723.4) Assessment Improved Significantly improved.  Continue with PT for full 4 weeks.  Aleve as needed.  Consider nortriptyline if radiculopathy recurs.  Wants to avoid steroids if possible.  F/u as needed.  Complete Medication List: 1)   Aspir-low 81 Mg Tbec (Aspirin) .Marland Kitchen.. 1 by mouth once daily 2)  Clorazepate Dipotassium 3.75 Mg Tabs (Clorazepate dipotassium) .Marland Kitchen.. 1 or two at bedtime as needed 3)  Calcium and Vit D Everyday  4)  Fish Oil 1000 Mg Caps (Omega-3 fatty acids) .... Take 1 capsule by mouth once a day 5)  Zyrtec Allergy 10 Mg Tbdp (Cetirizine hcl) .... One over-the-counter tablet daily as needed for allergies 6)  Zocor 10 Mg Tabs (Simvastatin) .Marland Kitchen.. 1 by mouth at bedtime. 7)  Spiriva Handihaler 18 Mcg Caps (Tiotropium bromide monohydrate) .... One time daily 8)  Nortriptyline Hcl 25 Mg Caps (Nortriptyline hcl) .Marland Kitchen.. 1 cap by mouth at bedtime for nerve pain 9)  Vicodin 5-500 Mg Tabs (Hydrocodone-acetaminophen) .Marland Kitchen.. 1 tab by mouth q6h as needed severe pain   Orders Added: 1)  Est. Patient Level II [04540]

## 2010-11-11 ENCOUNTER — Other Ambulatory Visit: Payer: Self-pay | Admitting: Internal Medicine

## 2010-11-13 ENCOUNTER — Ambulatory Visit: Payer: Medicare Other | Attending: Family Medicine | Admitting: Physical Therapy

## 2010-11-13 DIAGNOSIS — IMO0001 Reserved for inherently not codable concepts without codable children: Secondary | ICD-10-CM | POA: Insufficient documentation

## 2010-11-13 DIAGNOSIS — M2569 Stiffness of other specified joint, not elsewhere classified: Secondary | ICD-10-CM | POA: Insufficient documentation

## 2010-11-13 DIAGNOSIS — M25519 Pain in unspecified shoulder: Secondary | ICD-10-CM | POA: Insufficient documentation

## 2010-11-13 DIAGNOSIS — M542 Cervicalgia: Secondary | ICD-10-CM | POA: Insufficient documentation

## 2010-11-13 DIAGNOSIS — R293 Abnormal posture: Secondary | ICD-10-CM | POA: Insufficient documentation

## 2010-11-18 ENCOUNTER — Ambulatory Visit: Payer: Medicare Other | Admitting: Rehabilitation

## 2010-11-20 ENCOUNTER — Ambulatory Visit: Payer: Medicare Other | Admitting: Rehabilitation

## 2010-11-22 ENCOUNTER — Other Ambulatory Visit: Payer: Self-pay | Admitting: *Deleted

## 2010-11-22 MED ORDER — CLORAZEPATE DIPOTASSIUM 3.75 MG PO TABS: 3.7500 mg | ORAL_TABLET | Freq: Two times a day (BID) | ORAL | Status: AC | PRN

## 2010-11-22 NOTE — Telephone Encounter (Signed)
Last refilled 11/23/09 x 3. Last ov-10/11/10.

## 2010-11-22 NOTE — Telephone Encounter (Signed)
Ok 60, 3 RF 

## 2010-11-25 ENCOUNTER — Ambulatory Visit: Payer: Medicare Other | Admitting: Physical Therapy

## 2010-11-27 ENCOUNTER — Ambulatory Visit: Payer: Medicare Other | Admitting: Physical Therapy

## 2010-12-24 ENCOUNTER — Encounter: Payer: Self-pay | Admitting: Internal Medicine

## 2010-12-25 ENCOUNTER — Encounter: Payer: Self-pay | Admitting: Internal Medicine

## 2010-12-25 ENCOUNTER — Ambulatory Visit (INDEPENDENT_AMBULATORY_CARE_PROVIDER_SITE_OTHER): Payer: Medicare Other | Admitting: Internal Medicine

## 2010-12-25 DIAGNOSIS — E785 Hyperlipidemia, unspecified: Secondary | ICD-10-CM

## 2010-12-25 DIAGNOSIS — J449 Chronic obstructive pulmonary disease, unspecified: Secondary | ICD-10-CM

## 2010-12-25 DIAGNOSIS — M5412 Radiculopathy, cervical region: Secondary | ICD-10-CM

## 2010-12-25 NOTE — Assessment & Plan Note (Addendum)
Started spiriva  around January 2012, and doing great. Samples provided

## 2010-12-25 NOTE — Assessment & Plan Note (Signed)
Resolved

## 2010-12-25 NOTE — Progress Notes (Signed)
  Subjective:    Patient ID: Mary Jordan, female    DOB: 06-07-1935, 76 y.o.   MRN: [id number]  HPI Routine office visit Neck pain, saw sports physical, s/p PT, doing well!  Past Medical History  Diagnosis Date  . Lung mass     s/p excision of RUL beningn 2007  . COPD (chronic obstructive pulmonary disease)   . Osteoporosis   . Osteoarthritis   . Hyperlipidemia   . Anemia   . B12 deficiency   . Menopause   . Insomnia   . Allergic rhinitis      Review of Systems Good compliance with simvastatin, she was recommended to increase to 20 mg daily but she didn't. She is still taking 10 mg. Take it in the morning because she forgets to take it at night. Her diet has not changed or improved, has gained some weight. Her COPD is very well controlled,  Spiriva is helping, cost is an issue    Objective:   Physical Exam Alert, oriented x3, in no apparent distress. Lungs decreased breath sounds but otherwise clear Cardiovascular, regular rhythm without a murmur. Extremities no edema.       Assessment & Plan:

## 2010-12-25 NOTE — Assessment & Plan Note (Signed)
Started  simvastatin 10 mg ~ November 2011. Based on the last cholesterol panel, I recommend to increase to 20 mg daily but she is still on 10 mg. Plan: Continue with present care, diet discussed, recheck labs on return to the office

## 2010-12-27 NOTE — Op Note (Signed)
NAMEMarland Kitchen  Mary Jordan, Mary Jordan NO.:  [id number]   MEDICAL RECORD NO.:  [id number]          PATIENT TYPE:  AMB   LOCATION:  ENDO                         FACILITY:  [facility]   PHYSICIAN:  Leslye Peer, MD    DATE OF BIRTH:  17-Jun-1935   DATE OF PROCEDURE:  04/10/2006  DATE OF DISCHARGE:                                 OPERATIVE REPORT   PROCEDURE:  Fiberoptic bronchoscopy.   OPERATOR:  Leslye Peer, M.D.   INDICATIONS:  Right upper lobe nodule.   MEDICATIONS GIVEN:  1. Fentanyl 100 mcg IV in divided doses.  2. Versed 2.5 mg IV in divided doses.  3. Lidocaine 40 mL of 1% lidocaine to the endobronchial tree for local      anesthesia.   Consent was obtained from the patient, and a signed copy is on her hospital  chart.   PROCEDURE DETAILS:  After informed consent was obtained and conscious  sedation was initiated as indicated above, fiberoptic bronchoscope was  introduced through the right nare.  The posterior pharynx showed significant  cobblestoning.  The cords were within normal limits and moved normally with  phonation.  Trachea was intubated, and local anesthesia was achieved with 1%  lidocaine.  There was an irregularity of one tracheal rings in the mid  trachea.  This was not biopsied.  A full exam of the airways was performed.  The main carina was normal.  The right upper lobe, right middle lobe and  lower lobe airways were all normal in appearance with the exception of some  ectatic airways collapsibility.  There were no endobronchial lesions or  secretions noted.  Examination of the left-sided airway similarly showed  ectatic airways but no endobronchial lesions or secretions in the left  mainstem, left upper lobe lingular and left lower lobe bronchi.  Wang needle  biopsies were performed under fluoroscopic guidance from the right upper  lobe segmental airways and at the right upper lobe segmental carinae.  Transbronchial biopsies were then performed in  the apical segment of the  right upper lobe.  Endobronchial biopsies were performed at all segmental  bronchi of the right upper lobe.  Brushings were performed under  fluoroscopic guidance in the right upper lobe as well.  Finally,  bronchoalveolar lavage with 40 mL of normal saline instilled and 12 mL  returned was performed at the right upper lobe.  There was a small amount of  bleeding associated with the Wang needle biopsies with estimated blood loss  of approximately 45 mL total.  Also at the end of the procedure just prior  to her bronchoalveolar lavage, the patient displaced a transient elevation  in her ST-segment in lead II on continuous monitoring.  This lasted for  approximately 30-45 seconds.  The patient denied any discomfort at the time.  A rhythm strip was obtained, and the ST-segment elevations were confirmed.  These then returned spontaneously to normal.  The procedure was stopped  thereafter.  Post procedural EKG is planned and has not yet been performed.  The patient will also have a postprocedural chest x-ray which is pending.  The patient then returned to the recovery room in good condition, again  denying any dyspnea or chest discomfort.   SPECIMENS:  1. Transbronchial needle biopsies from the right upper lobe and the right      upper lobe segmental carinae.  2. Transbronchial biopsies in the right upper lobe.  3. Endobronchial biopsies from the right upper lobe segmental carinae.  4. Bronchial brushings from the right upper lobe.  5. Bronchioalveolar alveolar lavage from the right upper lobe.   PLANS:  [name]'s biopsies and washings will be sent for pathology and  cytology review.  I will contact her with the results of these studies next  week so that we can plan further evaluation and/or therapy.  Given our  suspicion that this represents malignancy, I feel that if she does not get a  tissue diagnosis from this test due to the difficult accessibility  and  location of this nodule then she will likely need surgery consultation for  definitive diagnosis.           ______________________________  Leslye Peer, MD     RSB/MEDQ  D:  04/10/2006  T:  04/11/2006  Job:  [id number]

## 2010-12-27 NOTE — Op Note (Signed)
Mary Jordan, Mary Jordan NO.:  [id number]   MEDICAL RECORD NO.:  [id number]          PATIENT TYPE:  INP   LOCATION:  2315                         FACILITY:  [facility]   PHYSICIAN:  Ines Bloomer, M.D. DATE OF BIRTH:  [birth year]   DATE OF PROCEDURE:  05/12/2006  DATE OF DISCHARGE:                                 OPERATIVE REPORT   PREOPERATIVE DIAGNOSIS:  Right upper lobe nodule, positive on PET scan.   POSTOPERATIVE DIAGNOSIS:  Probable benign right upper lobe nodule.   OPERATIONS PERFORMED:  Right video-assisted thoracoscopic surgery, right  thoracotomy, right upper lobectomy.   SURGEON:  Ines Bloomer, MD.   FIRST ASSISTANTS:  Kerin Perna, MD.; Constance Holster, PA-C.   DESCRIPTION OF PROCEDURE:  After percutaneous insertion of all monitoring  lines, the patient underwent general anesthesia.  She was turned into the  right lateral thoracotomy position and was prepped and draped in the usual  sterile manner.  This 76 year old patient had a long history of smoking and  was found to have chest pain and got a CT scan in August that showed a right  upper lobe lesion that was next to the bronchus in the right suprahilar  region.  The PET scan was positive in this area, and a bronchoscopy was  attempted by Dr. Cecelia Byars, but was unsuccessful.  She was referred for  treatment.  The lesion was so in the hilum that we could not do a needle  biopsy; so, we elected to proceed with a VATS/thoracotomy.  After discussing  the possibilities with the patient, the patient was prepped and draped in  the usual sterile manner.  Two trocar sites were made in the anterior  axillary line at the seventh intercostal space, and the midaxillary line at  the eighth intercostal space.  Two trocars were inserted.  A 0-degree scope  was inserted.  The patient had obvious emphysema.  We could see a thickened  right upper lobe.  A posterolateral thoracotomy was made over the fifth  intercostal space, the latissimus being partially divided and the serratus  reflected anteriorly.  The fifth intercostal space was entered.  Two  Tuffiers were placed at right angles.  The right upper lobe was palpated and  we could not feel a discrete lesion, but next to the bronchus, there was  some thickening in the area where it was positive on the PET scan.  Dr. Donata Clay came in and confirmed the finding.  We could not cut down on this  because this was really so close to the bronchus and the RA and the hilum,  that it would really destroy the lung by cutting down on the lesion.  So, we  elected to do a lobectomy.  Several 10R and 11R lymph nodes were dissected  free from around the bronchus.  The major fissure was divided with the  Autosuture 30 white Roticulator, exposing the posterior branch of the right  upper lobe.  It was doubly ligated with 0 silk, clipped and divided.  Then,  the minor fissure was divided with 2 applications of the  VH84 stapler.  We  then went anteriorly and divided the apical posterior branch of the  pulmonary artery with a Autosuture stapler, and then the pulmonary vein with  the Autosuture stapler.  One other small anterior branch was doubly clipped  and divided.  The bronchus was stapled with a TL-30 stapler and divided  distally.  We then opened the lung and found the area next to the bronchus,  which was sent for frozen section in view of the probable benign lesion.  This was an inflammatory lymph node, that was at 12, which was intra in the  hilar area, next to the bronchus.  Two chest tubes were brought into the  trocar sites and tied in place with 0 silk.  A Marcaine block was done in  the usual fashion.  The chest was closed  with 4 paracostals, drilling holes through the sixth rib and going around  the anterior portion of the fifth rib.  The muscle layer was closed with  running #1 Vicryl, and 2-0 Vicryl in the subcutaneous tissue and Dermabond   for the skin.  The patient returned to the recovery room in stable  condition.           ______________________________  Ines Bloomer, M.D.     DPB/MEDQ  D:  05/12/2006  T:  05/12/2006  Job:  [id number]   cc:   Kerin Perna, M.D.  Leslye Peer, MD

## 2010-12-27 NOTE — H&P (Signed)
Mary Jordan, Mary Jordan NO.:  [id number]   MEDICAL RECORD NO.:  [id number]          PATIENT TYPE:  INP   LOCATION:  NA                           FACILITY:  [facility]   PHYSICIAN:  Ines Bloomer, M.D. DATE OF BIRTH:  [birth year]   DATE OF ADMISSION:  05/12/2006  DATE OF DISCHARGE:                                HISTORY & PHYSICAL   CHIEF COMPLAINT:  Right upper lobe lung nodule.   HISTORY OF PRESENT ILLNESS:  This 76 year old patient with a long history of  tobacco abuse quit smoking several years ago in 1998.  She gets shortness of  breath when walking up a hill or climbing over a flight of stairs, but no  shortness of breath while walking on level ground.  She also swims and  remains active.  She had a CT angiogram in August of 2007 which showed a  right upper lobe nodule.  PET scan revealed that this had an SUV of 6.3.  Biopsies were inconclusive.  CT scan did show central lobular emphysema.  PFT's showed an FVC of 3.18 with a diffusion capacity of 59% and FEV-1 of  1.27, which was read as a moderate to severe obstructive effect.   PAST MEDICAL HISTORY:  1. Hypercholesterolemia.  2. She had a hysterectomy at age 26.   ALLERGIES:  LEVAQUIN, which causes hives.   MEDICATIONS:  1. Aspirin 81 mg daily.  2. Clorazepate 3.75 as needed.   FAMILY HISTORY:  Positive for coronary disease.  Her father died at age 6.   SOCIAL HISTORY:  She lives alone.  She is divorced.  Works as a [employer] operational officer.  Has occasional alcohol intake.  Quit smoking in 1998, but has a long, many  year history of tobacco abuse.   REVIEW OF SYSTEMS:  She is 135 pounds, 5 feet, 2 inches.  CARDIAC:  No  angina or atrial fibrillation.  PULMONARY:  See history of present illness.  She has no hemoptysis.  GI:  No nausea, vomiting, constipation or diarrhea.  GU:  No dysuria, frequent urination or kidney disease.  VASCULAR:  No  claudication, DVT, TIAs.  NEUROLOGIC:  No headaches, blackouts or  seizures.  MUSCULOSKELETAL:  She has chronic arthritic pains.  PSYCHIATRIC:  Negative.  ENT:  No change in her eyesight or hearing.  HEMATOLOGIC:  No problems with  bleeding or anemia.   PHYSICAL EXAMINATION:  GENERAL:  She is an active-appearing Caucasian female  in no acute distress.  VITAL SIGNS:  Blood pressure is 130/80, pulse 96, respirations 18,  saturations 96%.  HEENT:  Head is atraumatic.  Eyes - pupils equal, reactive to light and  accommodation.  Extraocular movements are normal.  Ears - tympanic membranes  are intact.  Nose - there is no septal deviation.  Throat is without lesion.  CHEST:  Clear to auscultation and percussion.  HEART:  Regular sinus rhythm.  No murmurs.  ABDOMEN:  Soft.  Bowel sounds are normal.  There is no hepatosplenomegaly.  EXTREMITIES:  Pulses are 2+.  No clubbing or edema.  NEUROLOGIC:  She is oriented x3.  Sensory and motor  intact.  NECK:  Supple without thyromegaly or carotid bruits.  There is no  supraclavicular or axillary adenopathy.  SKIN:  Without lesions.   IMPRESSION:  1. Right upper lobe nodule.  2. Hypercholesterolemia.  3. Moderate obstructive pulmonary disease.  4. History of tobacco abuse.   PLAN:  Right VATS.  Possible right upper lobectomy.           ______________________________  Ines Bloomer, M.D.     DPB/MEDQ  D:  05/11/2006  T:  05/11/2006  Job:  [id number]

## 2010-12-27 NOTE — Assessment & Plan Note (Signed)
[facility]                               PULMONARY OFFICE NOTE   SHANOAH, ASBILL                   MRN:          [id number]  DATE:04/01/2006                            DOB:          15-Sep-1934    REASON FOR CONSULTATION:  We were asked by Dr. Tenna Child to evaluate Mrs.  Jarold Motto for abdominal CT scan of the chest.   BRIEF HISTORY:  Mrs. Luty is a 76 year old woman with a history of  former tobacco abuse, hyperlipidemia.  She states that she has had some mild  dyspnea while doing her usual activities.  She is fairly active and is a  member of the hiking club.  She has had no shortness of breath when hiking  on flat ground but does get winded when she exerts herself on hills.  She is  also able to swim without shortness of breath.  Her dyspnea prompted a CT  pulmonary angiogram on March 13, 2006 which did not show any evidence of a  pulmonary embolism but was worrisome for a right upper lobe nodule versus  mass.  She is referred for abnormal CT scan and for her mild dyspnea.   PAST MEDICAL HISTORY:  1. Hypercholesterolemia.  2. Hysterectomy at age 15.   ALLERGIES:  LEVOFLOXACIN causes hives.   MEDICATIONS:  Aspirin 81 mg daily.   SOCIAL HISTORY:  The patient lives alone. She is divorced.  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  She has 40-60 pack year total history and quit  smoking in 1998.  She uses occasional alcohol.   FAMILY HISTORY:  Significant for coronary artery disease in her father.   REVIEW OF SYSTEMS:  As per the HPI.  She denies any significant weight loss,  fevers, chills, night sweats.   PHYSICAL EXAMINATION:  GENERAL:  This is an anxious woman who is in no  distress on room air.  VITAL SIGNS:  Weight 135 pounds, temperature 97.1, blood pressure 134/82,  heart rate 85, SpO2 97% on  room air.  LUNGS:  Clear to auscultation bilaterally.  HEENT: Benign.  HEART:  Regular rate and rhythm without murmur.  ABDOMEN:  Soft, nontender with positive bowel sounds.  EXTREMITIES:  No cyanosis, clubbing or edema.   LABORATORY DATA:  Pulmonary function testing performed on March 27, 2006  was unfortunately a poor study due to poor technique and which impairs  interpretation.  The study showed airflow limitation without any evidence of  bronchodilator responsiveness.  Normal lung volumes and a decreased DLCO  that did not correct for alveolar volume.  Her CT scan of the chest was  performed on March 13, 2006.  This showed no evidence of pulmonary embolism,  right upper lobe pulmonary nodule and a suprahilar position that measured  1.6 x 1.2 cm  and was 1.5 cm in craniocaudal dimension.  There was also a  right hilar lymph node that measured 4.4 x 0.5 cm.  Also noted was  significant emphysematous change.   IMPRESSION:  1. Chronic obstructive pulmonary disease with former tobacco use.  2. Right upper lobe  nodule/mass that is worrisome for a primary lung      cancer.  Its location may make it difficult to access by fiberoptic      bronchoscopy.   PLAN:  1. PET scan as soon as possible to evaluate this abnormality for      enhancement and also to see if there are other lesions that would be      more amenable to biopsy.  2. If her right upper lobe lesion is consistent with malignancy on PET      scan, then we will set up a fiberoptic bronchoscopy.  If the lesion is      negative on PET scan, then we could possibly defer and follow this with      serial scans.  If her lesion is not amenable to biopsy by bronchoscopy,      then she may require VATS biopsy if our suspicion remains high.  3. I will call Mrs. Jarold Motto after her PET scan to schedule our next      steps.                                   Leslye Peer, MD   RSB/MedQ  DD:  04/07/2006  DT:  04/08/2006  Job #:  [id number]   cc:   Willow Ora, MD

## 2010-12-27 NOTE — Discharge Summary (Signed)
Mary Jordan, Mary Jordan NO.:  [id number]   MEDICAL RECORD NO.:  [id number]          PATIENT TYPE:  INP   LOCATION:  2028                         FACILITY:  [facility]   PHYSICIAN:  Ines Bloomer, M.D. DATE OF BIRTH:  Jun 18, 1935   DATE OF ADMISSION:  05/12/2006  DATE OF DISCHARGE:                                 DISCHARGE SUMMARY   DISCHARGE DATE:  Tentatively May 19, 2006.   ADMISSION DIAGNOSIS:  Right upper lobe nodule, positive saw on PET scan.   DISCHARGE DIAGNOSES:  1. Probable benign nodules, status post right upper lobectomy.  2. Hypercholesterolemia.  3. Long history of tobacco abuse, quit smoking several years ago.  4. Chronic obstructive pulmonary disease.  5. History of hysterectomy at age 79.   CONSULTS:  On May 16, 2006, Dr. Delton Coombes from pulmonary was consulted.   PROCEDURES:  On May 12, 2006, the patient underwent a right video-  assisted thorascopic surgery, right thoracotomy, right upper lobectomy by  Dr. Ines Bloomer.   HISTORY AND PHYSICAL:  This is a 76 year old patient with a long history of  tobacco abuse and quit smoking in 1998.  Patient complains of shortness of  breath with exertion when climbing a hill or flight of stairs, but no  shortness of breath while on ground level.  Patient had a CT angiogram in  August of 2007, which showed a right upper lobe nodule.  A PET scan revealed  that this had an SEV of 6.3.  Biopsies were inconclusive.  CT scan that  showed central lobular emphysema.  PFT showed an FVC of 3.18 and FEV-1 of  1.27.  It was best felt that the patient undergo a lobectomy, risks and  benefits are explained to the patient and she has agreed to proceed.   HOSPITAL COURSE:  The patient underwent a right VATS and right upper  lobectomy May 12, 2006 without any complications.  The patient was able  to be extubated.  Postop day number 1, patient was alert and oriented.  She  was out of bed.  Her chest tube did  not have an air leak and her chest x-ray  was stable.  The patient was transferred to 3300.  The patient had a PCA for  pain.  On postop day number 2, the patient's vital signs have remained  stable.  Her chest tube drains about 150 cc every 8 hours and she had a 1/7  air leak.  Her chest x-ray did not show abnormal thorax and was stable.  The  patient's renal and liver function were within normal limits.  The patient  remained hemodynamically stable.  On May 16, 2006, the patient was  switched to a mini express.  She did not have an air leak and it was hoped  that the chest tube would be able to be discontinued on May 17, 2006.  The patient's chest tube was discontinued on May 17, 2006 without  complications.  The patient's PCA was then discontinued.  She had some  hyperkalemia and her potassium was replaced with potassium chloride.  Her  potassium did raise  appropriately.  Patient was ambulating in the halls with  assistance and a steady gait.  She was encouraged to continue her incentive  spirometry.   On May 16, 2006, Dr. Delton Coombes from pulmonary saw the patient for medication  management and nebulizer treatments for her recently diagnosed COPD.  The  patient was put on Atrovent every 6 hours and albuterol.  Patient's  pathology has not been finalized yet.  Currently, the patient's vital signs  are stable and she is afebrile.  Patient did have some nausea on May 18, 2006 and the patient was given Zofran.  Patient will be discharged home in  the next 1 to 2 days provided that she remains stable and her nausea has  improved.   DISCHARGE DISPOSITION:  Patient patient will be discharged to home.   MEDICATIONS:  Include:  1. Aspirin 81 mg daily.  2. Tylox one to two tabs every 4 hours p.r.n.  3. The patient may go home on nebulizers, this will be determined at the      patient's discharge.   INSTRUCTIONS:  The patient instructed to follow a low-fat, low-salt diet.  No  driving or heavy lifting greater than 10 pounds for 2 weeks.  The patient  is to ambulate 3 to 4 times daily and increase activity as tolerated.  She  should continue her breathing exercise.  She may shower and clean her  incisions with mild soap and water.  She is to call the office if any wound  problems shall arise, such as incision drainage, temp greater than 101.5 or  incision erythema.   FOLLOWUP:  The patient will have a followup appointment with Dr. Edwyna Shell in a  week.  The office will contact her with a time and date of appointment.  Prior to seeing Dr. Edwyna Shell, she will have a chest x-[facility] taken at [facility].      Constance Holster, PA    ______________________________  Ines Bloomer, M.D.    JMW/MEDQ  D:  05/18/2006  T:  05/18/2006  Job:  [id number]   cc:   Leslye Peer, MD

## 2011-01-05 ENCOUNTER — Emergency Department (HOSPITAL_BASED_OUTPATIENT_CLINIC_OR_DEPARTMENT_OTHER)
Admission: EM | Admit: 2011-01-05 | Discharge: 2011-01-05 | Disposition: A | Discharge status: Home / Self Care | Payer: Medicare Other | Attending: Emergency Medicine | Admitting: Emergency Medicine

## 2011-01-05 DIAGNOSIS — L089 Local infection of the skin and subcutaneous tissue, unspecified: Secondary | ICD-10-CM | POA: Insufficient documentation

## 2011-01-05 DIAGNOSIS — Z79899 Other long term (current) drug therapy: Secondary | ICD-10-CM | POA: Insufficient documentation

## 2011-03-09 ENCOUNTER — Other Ambulatory Visit: Payer: Self-pay | Admitting: Internal Medicine

## 2011-03-15 ENCOUNTER — Other Ambulatory Visit: Payer: Self-pay | Admitting: Internal Medicine

## 2011-03-26 ENCOUNTER — Encounter: Payer: Self-pay | Admitting: Internal Medicine

## 2011-03-26 ENCOUNTER — Ambulatory Visit (INDEPENDENT_AMBULATORY_CARE_PROVIDER_SITE_OTHER): Payer: Medicare Other | Admitting: Internal Medicine

## 2011-03-26 DIAGNOSIS — J4489 Other specified chronic obstructive pulmonary disease: Secondary | ICD-10-CM

## 2011-03-26 DIAGNOSIS — E785 Hyperlipidemia, unspecified: Secondary | ICD-10-CM

## 2011-03-26 DIAGNOSIS — D518 Other vitamin B12 deficiency anemias: Secondary | ICD-10-CM

## 2011-03-26 DIAGNOSIS — R930 Abnormal findings on diagnostic imaging of skull and head, not elsewhere classified: Secondary | ICD-10-CM

## 2011-03-26 DIAGNOSIS — Z0000 Encounter for general adult medical examination without abnormal findings: Secondary | ICD-10-CM

## 2011-03-26 DIAGNOSIS — J449 Chronic obstructive pulmonary disease, unspecified: Secondary | ICD-10-CM

## 2011-03-26 DIAGNOSIS — M810 Age-related osteoporosis without current pathological fracture: Secondary | ICD-10-CM

## 2011-03-26 LAB — BASIC METABOLIC PANEL
CO2: 27 mEq/L (ref 19–32)
Calcium: 9.4 mg/dL (ref 8.4–10.5)
Potassium: 4.8 mEq/L (ref 3.5–5.1)
Sodium: 141 mEq/L (ref 135–145)

## 2011-03-26 LAB — LIPID PANEL
Cholesterol: 243 mg/dL — ABNORMAL HIGH (ref 0–200)
HDL: 52.8 mg/dL (ref >39.00)
Total CHOL/HDL Ratio: 5
Triglycerides: 106 mg/dL (ref 0.0–149.0)

## 2011-03-26 LAB — VITAMIN B12: Vitamin B-12: 453 pg/mL (ref 211–911)

## 2011-03-26 NOTE — Progress Notes (Signed)
  Subjective:    Patient ID: Mary Jordan, female    DOB: Apr 19, 1935, 76 y.o.   MRN: [id number]  HPI Here for Medicare AWV: 1. Risk factors based on Past M, S, F history: reviewed 2. Physical Activities: does home chores, yard works, [employer] twice a week 3. Depression/mood:  No problems noted or reported  4. Hearing:  No problems noted or reported  5. ADL's: independent, still drives  6. Fall Risk: no recent problems, average risk 7. home Safety: does feel safe at home  8. Height, weight, &visual acuity: see VS, poor L vision d/t cataract, to have surgery 9. Counseling: provided 10. Labs ordered based on risk factors: if needed  11. Referral Coordination: if needed 12.  Care Plan, see assessment and plan  13.   Cognitive Assessment: Motor skill and cognition within normal  In addition, today we discussed the following: Cervical radiculopathy-- better Hyperlipidemia-- good med compliance, no apparent s/e  COPD-- on spiriva, sx well controlled  Insomnia-- on meds "once in a while"  Past Medical History  Diagnosis Date  . Lung mass     s/p excision of RUL beningn 2007  . COPD (chronic obstructive pulmonary disease)   . Osteoporosis   . Osteoarthritis   . Hyperlipidemia   . Anemia   . B12 deficiency   . Menopause   . Insomnia   . Allergic rhinitis    Past Surgical History  Procedure Date  . Abdominal hysterectomy   . Breast biopsy     remotely, neg  . Lesion from lung excised, benign 05-2006  . Cardiovascular stress test 2007    Cardiolite negative   Family History: MI--father died at age 71 from a heart attack dementia--M DM--no strokes--no colon ca--no breast ca--M dx in her 77s    Social History: Divorced lives by herself has two children, one in [place], one lives in [place] G-kids all boys  totally independent on her ADL  ETOH-- socially Tobacco-- quit in the 90, used to smoke < 1ppd   Review of Systems No chest pain or lower extremity edema No  cause for hemoptysis, occasional shortness of breath. Unable to lose weight despite being very active. Note nausea, vomiting, diarrhea blood in the stools. No dysuria, vaginal discharge or vaginal bleeding    Objective:   Physical Exam  Constitutional: She is oriented to person, place, and time. She appears well-developed and well-nourished. No distress.  HENT:  Head: Normocephalic and atraumatic.  Neck: No thyromegaly present.  Cardiovascular: Normal rate, regular rhythm and normal heart sounds.   No murmur heard. Pulmonary/Chest: Effort normal and breath sounds normal. No respiratory distress. She has no wheezes. She has no rales.  Abdominal: Soft. Bowel sounds are normal. She exhibits no distension. There is no tenderness. There is no rebound and no guarding.  Genitourinary:       Breast exam normal bilaterally, no axillary lymphadenopathy   Musculoskeletal: She exhibits no edema.  Neurological: She is alert and oriented to person, place, and time.  Skin: She is not diaphoretic.  Psychiatric: She has a normal mood and affect. Her behavior is normal. Judgment and thought content normal.          Assessment & Plan:

## 2011-03-26 NOTE — Assessment & Plan Note (Addendum)
Stable , Last cxr 1-12: stable

## 2011-03-26 NOTE — Assessment & Plan Note (Deleted)
Last cxr 1-12: stable

## 2011-03-26 NOTE — Assessment & Plan Note (Addendum)
Last dexa 10-10, T score -2.4 similar to previous  intolerant to fosamax, actonel: $$ had a  reclast  12/11 per patient ( no report from the hospital found) Plan: reclast 12-12, check vit d

## 2011-03-26 NOTE — Assessment & Plan Note (Signed)
Get shots elsewhere, labs

## 2011-03-26 NOTE — Assessment & Plan Note (Signed)
Td 2008 pneumonia shot 2008 had the  shingles shot already   MMG 3-11 and 2-12 neg  , breast exam today ok rec  SBE  last PAP long ago, has not seen a gyn in a while, her previous doctor told her she does not need PAPs anymore. No h/o abnormal PAPs   Cscope 03-2008 Dr Juanda Chance 2 polyps (Hyperplastic and tubular adenoma) , next per GI Diet, exercise  Discussed

## 2011-03-26 NOTE — Assessment & Plan Note (Addendum)
LDL goal < 130, was recommended simva 20mg  but still on 10mg   labs

## 2011-04-01 ENCOUNTER — Other Ambulatory Visit: Payer: Self-pay | Admitting: *Deleted

## 2011-04-01 MED ORDER — ATORVASTATIN CALCIUM 40 MG PO TABS: 40.0000 mg | ORAL_TABLET | Freq: Every day | ORAL | Status: DC

## 2011-04-07 ENCOUNTER — Encounter: Payer: Self-pay | Admitting: Internal Medicine

## 2011-05-26 ENCOUNTER — Other Ambulatory Visit: Payer: Self-pay | Admitting: Internal Medicine

## 2011-05-26 NOTE — Telephone Encounter (Signed)
Request for Simvastatin-showing D/C on 03/26/11 med list. Please advise.

## 2011-06-02 ENCOUNTER — Telehealth: Payer: Self-pay | Admitting: Internal Medicine

## 2011-06-02 MED ORDER — SIMVASTATIN 10 MG PO TABS: 10.0000 mg | ORAL_TABLET | Freq: Every day | ORAL | Status: DC

## 2011-06-02 NOTE — Telephone Encounter (Signed)
done

## 2011-06-02 NOTE — Telephone Encounter (Signed)
The pt called requesting Zocor 10mg , however, i do not see this listed as a med this pt is taking.

## 2011-06-06 ENCOUNTER — Telehealth: Payer: Self-pay | Admitting: Internal Medicine

## 2011-06-06 MED ORDER — TIOTROPIUM BROMIDE MONOHYDRATE 18 MCG IN CAPS: 18.0000 ug | ORAL_CAPSULE | Freq: Every day | RESPIRATORY_TRACT | Status: DC

## 2011-06-06 MED ORDER — ATORVASTATIN CALCIUM 40 MG PO TABS: 40.0000 mg | ORAL_TABLET | Freq: Every day | ORAL | Status: DC

## 2011-06-06 NOTE — Telephone Encounter (Signed)
Patient informed. 

## 2011-06-06 NOTE — Telephone Encounter (Signed)
Spiriva RF completed. Should patient be on simvastatin or lipitor?

## 2011-06-06 NOTE — Telephone Encounter (Signed)
Advise patient:  I just sent a refill for Lipitor 40 mg. She is not supposed to be on simvastatin (I already discontinued it from her medication list)

## 2011-06-06 NOTE — Telephone Encounter (Signed)
Patient needs refill for spireva & simvastatin - she will be out of spireva Saturday - patient said pharmacy keeps telling her to call pcp for refill - they never received request for simvastatin

## 2011-07-13 ENCOUNTER — Telehealth: Payer: Self-pay | Admitting: Internal Medicine

## 2011-07-13 NOTE — Telephone Encounter (Signed)
Due for reclast. Please arrange, will need a BMP

## 2011-07-17 NOTE — Telephone Encounter (Signed)
Confirmation of fax receipt received from [employer]

## 2011-07-17 NOTE — Telephone Encounter (Signed)
[employer] docs faxed. awaiting response

## 2011-07-28 ENCOUNTER — Other Ambulatory Visit: Payer: Self-pay | Admitting: Internal Medicine

## 2011-07-28 ENCOUNTER — Other Ambulatory Visit (INDEPENDENT_AMBULATORY_CARE_PROVIDER_SITE_OTHER): Payer: Medicare Other

## 2011-07-28 DIAGNOSIS — M810 Age-related osteoporosis without current pathological fracture: Secondary | ICD-10-CM

## 2011-07-28 NOTE — Telephone Encounter (Signed)
lipitor #30, 5 RF Tranxene 60, 3

## 2011-07-28 NOTE — Telephone Encounter (Signed)
CLORAZEPATE DIPOTASSIUM 3.75 MG Last OV 03-26-11, last filled 11-22-10 #[phone]

## 2011-07-29 MED ORDER — CLORAZEPATE DIPOTASSIUM 3.75 MG PO TABS: 3.7500 mg | ORAL_TABLET | Freq: Every day | ORAL | Status: DC

## 2011-07-29 MED ORDER — ATORVASTATIN CALCIUM 40 MG PO TABS: 40.0000 mg | ORAL_TABLET | Freq: Every day | ORAL | Status: DC

## 2011-07-30 NOTE — Telephone Encounter (Signed)
Labs ok , ready for reclast, see labs

## 2011-07-31 LAB — BASIC METABOLIC PANEL
BUN: 12 mg/dL (ref 6–23)
Calcium: 9.1 mg/dL (ref 8.4–10.5)
Creatinine, Ser: 0.7 mg/dL (ref 0.4–1.2)
GFR: 91.38 mL/min (ref >60.00)
Glucose, Bld: 59 mg/dL — ABNORMAL LOW (ref 70–99)

## 2011-07-31 NOTE — Telephone Encounter (Signed)
Info fax awaiting appt.

## 2011-08-06 NOTE — Telephone Encounter (Signed)
Pt states that she has been contacted for a appointment. Pt has since called and postpone appt for at least 7 weeks.

## 2011-09-15 ENCOUNTER — Other Ambulatory Visit: Payer: Self-pay | Admitting: Internal Medicine

## 2011-09-15 DIAGNOSIS — Z1231 Encounter for screening mammogram for malignant neoplasm of breast: Secondary | ICD-10-CM

## 2011-09-24 ENCOUNTER — Ambulatory Visit: Payer: Medicare Other | Admitting: Internal Medicine

## 2011-10-07 DIAGNOSIS — Z85828 Personal history of other malignant neoplasm of skin: Secondary | ICD-10-CM | POA: Diagnosis not present

## 2011-10-07 DIAGNOSIS — D235 Other benign neoplasm of skin of trunk: Secondary | ICD-10-CM | POA: Diagnosis not present

## 2011-10-07 DIAGNOSIS — L820 Inflamed seborrheic keratosis: Secondary | ICD-10-CM | POA: Diagnosis not present

## 2011-10-07 DIAGNOSIS — L821 Other seborrheic keratosis: Secondary | ICD-10-CM | POA: Diagnosis not present

## 2011-10-13 ENCOUNTER — Other Ambulatory Visit: Payer: Self-pay | Admitting: Internal Medicine

## 2011-10-13 NOTE — Telephone Encounter (Signed)
Prescription sent to pharmacy.

## 2011-10-17 ENCOUNTER — Other Ambulatory Visit (INDEPENDENT_AMBULATORY_CARE_PROVIDER_SITE_OTHER): Payer: Medicare Other

## 2011-10-17 DIAGNOSIS — M810 Age-related osteoporosis without current pathological fracture: Secondary | ICD-10-CM | POA: Diagnosis not present

## 2011-10-17 DIAGNOSIS — E785 Hyperlipidemia, unspecified: Secondary | ICD-10-CM

## 2011-10-17 LAB — LIPID PANEL
HDL: 52.1 mg/dL (ref >39.00)
LDL Cholesterol: 110 mg/dL — ABNORMAL HIGH (ref 0–99)
Total CHOL/HDL Ratio: 3
VLDL: 13.4 mg/dL (ref 0.0–40.0)

## 2011-10-17 LAB — BASIC METABOLIC PANEL
BUN: 12 mg/dL (ref 6–23)
CO2: 26 mEq/L (ref 19–32)
Chloride: 102 mEq/L (ref 96–112)
GFR: 86.82 mL/min (ref >60.00)
Glucose, Bld: 90 mg/dL (ref 70–99)
Potassium: 3.7 mEq/L (ref 3.5–5.1)

## 2011-10-21 LAB — ALT: ALT: 19 U/L (ref 0–35)

## 2011-10-22 ENCOUNTER — Ambulatory Visit (HOSPITAL_BASED_OUTPATIENT_CLINIC_OR_DEPARTMENT_OTHER)
Admission: RE | Admit: 2011-10-22 | Discharge: 2011-10-22 | Disposition: A | Discharge status: Home / Self Care | Payer: Medicare Other | Source: Ambulatory Visit | Attending: Internal Medicine | Admitting: Internal Medicine

## 2011-10-22 ENCOUNTER — Telehealth: Payer: Self-pay | Admitting: *Deleted

## 2011-10-22 ENCOUNTER — Telehealth: Payer: Self-pay | Admitting: Internal Medicine

## 2011-10-22 DIAGNOSIS — Z1231 Encounter for screening mammogram for malignant neoplasm of breast: Secondary | ICD-10-CM | POA: Diagnosis not present

## 2011-10-22 NOTE — Telephone Encounter (Signed)
Used a Haematologist note

## 2011-10-22 NOTE — Telephone Encounter (Signed)
Reclast forms have been faxed over.

## 2011-11-03 ENCOUNTER — Other Ambulatory Visit: Payer: Self-pay | Admitting: Internal Medicine

## 2011-11-03 ENCOUNTER — Encounter: Payer: Self-pay | Admitting: *Deleted

## 2011-11-03 NOTE — Telephone Encounter (Signed)
Refill done.  

## 2011-11-03 NOTE — Telephone Encounter (Signed)
Advise patient: Ok 30, no RF Needs OV before next RF

## 2011-11-03 NOTE — Telephone Encounter (Signed)
Refill request for clorazepate 3.75mg  #30 with zero refills. Last refilled 12.18.12. OK to refill?

## 2011-11-10 ENCOUNTER — Telehealth: Payer: Self-pay | Admitting: *Deleted

## 2011-11-10 NOTE — Telephone Encounter (Signed)
Pt left vm stating that she had a mammogram and was advised per medicare that the code was wrong and she will have to pay 89.00 out of pocket for this procedure due to the incorrect coding, pt also notes that she has an upcoming mammogram scheduled and wants to make sure this is handled before she is billed another $89.00 for the upcoming mammogram, please advise

## 2011-11-18 NOTE — Telephone Encounter (Signed)
Please advise 

## 2011-11-18 NOTE — Telephone Encounter (Signed)
Noted in system under incorrect CMA name, sent to correct name, please advise,

## 2011-11-19 NOTE — Telephone Encounter (Signed)
Will discuss w/ coders

## 2011-11-21 DIAGNOSIS — M810 Age-related osteoporosis without current pathological fracture: Secondary | ICD-10-CM | POA: Diagnosis not present

## 2011-12-03 NOTE — Telephone Encounter (Signed)
I communicated with our coders, the right codes are [id number] or [phone] ; this information was sent to [employer], let pt know

## 2011-12-04 NOTE — Telephone Encounter (Signed)
Discussed with pt

## 2012-01-26 ENCOUNTER — Other Ambulatory Visit: Payer: Self-pay | Admitting: Internal Medicine

## 2012-01-26 MED ORDER — CLORAZEPATE DIPOTASSIUM 3.75 MG PO TABS: ORAL_TABLET | ORAL | Status: DC

## 2012-01-26 NOTE — Telephone Encounter (Signed)
Ok to refill 

## 2012-01-26 NOTE — Telephone Encounter (Signed)
Advice patient, needs office visit.  Call #30 , no refills

## 2012-01-26 NOTE — Telephone Encounter (Signed)
Refill done.  

## 2012-03-29 ENCOUNTER — Other Ambulatory Visit: Payer: Self-pay | Admitting: Internal Medicine

## 2012-03-29 NOTE — Telephone Encounter (Signed)
Refill done.  

## 2012-04-15 ENCOUNTER — Ambulatory Visit (INDEPENDENT_AMBULATORY_CARE_PROVIDER_SITE_OTHER): Payer: Medicare Other | Admitting: Critical Care Medicine

## 2012-04-15 ENCOUNTER — Encounter: Payer: Self-pay | Admitting: Critical Care Medicine

## 2012-04-15 VITALS — BP 140/78 | HR 90 | Temp 98.0°F | Ht 63.0 in | Wt 140.5 lb

## 2012-04-15 DIAGNOSIS — Z23 Encounter for immunization: Secondary | ICD-10-CM

## 2012-04-15 DIAGNOSIS — J449 Chronic obstructive pulmonary disease, unspecified: Secondary | ICD-10-CM

## 2012-04-15 MED ORDER — BUDESONIDE-FORMOTEROL FUMARATE 160-4.5 MCG/ACT IN AERO: 2.0000 | INHALATION_SPRAY | Freq: Two times a day (BID) | RESPIRATORY_TRACT | Status: DC

## 2012-04-15 MED ORDER — ALBUTEROL SULFATE HFA 108 (90 BASE) MCG/ACT IN AERS: 2.0000 | INHALATION_SPRAY | Freq: Four times a day (QID) | RESPIRATORY_TRACT | Status: DC | PRN

## 2012-04-15 NOTE — Progress Notes (Signed)
Subjective:    Patient ID: Mary Jordan, female    DOB: [birth year], 77 y.o.   MRN: [id number]  HPI Comments: Dx Copd 19yr.   No cigarettes since 1998. Main complaint is dyspnea  Shortness of Breath This is a chronic problem. The current episode started more than 1 year ago. The problem occurs every several days (exertional dyspnea, esp vacuuming/working in yard.  Comes and goes.  Occ at rest.  no qhs dyspnea). The problem has been gradually worsening. Duration: will recover in a few minutes. Pertinent negatives include no abdominal pain, chest pain, claudication, coryza, ear pain, fever, headaches, hemoptysis, leg pain, leg swelling, neck pain, orthopnea, PND, rash, rhinorrhea, sore throat, sputum production, swollen glands, syncope, vomiting or wheezing. The symptoms are aggravated by any activity, exercise and weather changes. Risk factors include smoking. Treatments tried: spiriva. The treatment provided no relief. Her past medical history is significant for COPD. There is no history of allergies, aspirin allergies, asthma, bronchiolitis, CAD, chronic lung disease, DVT, a heart failure, PE, pneumonia or a recent surgery.   Past Medical History  Diagnosis Date  . Lung mass     s/p excision of RUL benign 2007  . COPD (chronic obstructive pulmonary disease)   . Osteoporosis   . Osteoarthritis   . Hyperlipidemia   . Anemia   . B12 deficiency   . Menopause   . Insomnia   . Allergic rhinitis      Family History  Problem Relation Age of Onset  . Heart attack Father 61  . Dementia Mother   . Diabetes Neg Hx   . Stroke Neg Hx   . Colon cancer Neg Hx   . Breast cancer Mother   . Asthma Mother      History   Social History  . Marital Status: Divorced    Spouse Name: N/A    Number of Children: 2  . Years of Education: N/A   Occupational History  . Retired     Diplomatic Services operational officer   Social History Main Topics  . Smoking status: Former Smoker -- 1.0 packs/day for 39 years    Types:  Cigarettes    Quit date: 08/11/1996  . Smokeless tobacco: Never Used   Comment: Using nicorette gum  . Alcohol Use: Yes     three times weekly  . Drug Use: No  . Sexually Active: Not on file   Other Topics Concern  . Not on file   Social History Narrative   Lives by herselfHas 2 children, 4 gkids all boys Totally independent on her ADL     Allergies  Allergen Reactions  . Levofloxacin     REACTION: hives on chest     Outpatient Prescriptions Prior to Visit  Medication Sig Dispense Refill  . aspirin 81 MG tablet Take 81 mg by mouth daily.        . cetirizine (ZYRTEC) 10 MG tablet Take 10 mg by mouth daily as needed.       . clorazepate (TRANXENE) 3.75 MG tablet Take one or two tablets by mouth at bedtime.  30 tablet  0  . SPIRIVA HANDIHALER 18 MCG inhalation capsule INHALE THE CONTENTS OF 1 CAPSULE ONCE   DAILY  30 each  0  . atorvastatin (LIPITOR) 40 MG tablet Take 1 tablet (40 mg total) by mouth daily.  30 tablet  2  . Calcium Carbonate-Vitamin D ([employer] CALCIUM/VIT D SOFT CHEWS) 600-400 MG-UNIT per chew tablet Chew 1 tablet by mouth daily.  Review of Systems  Constitutional: Positive for fatigue. Negative for fever, chills, diaphoresis, activity change, appetite change and unexpected weight change.  HENT: Positive for nosebleeds, sneezing and tinnitus. Negative for hearing loss, ear pain, congestion, sore throat, facial swelling, rhinorrhea, mouth sores, trouble swallowing, neck pain, neck stiffness, dental problem, voice change, postnasal drip, sinus pressure and ear discharge.   Eyes: Negative for photophobia, discharge, itching and visual disturbance.  Respiratory: Positive for shortness of breath. Negative for apnea, cough, hemoptysis, sputum production, choking, chest tightness, wheezing and stridor.   Cardiovascular: Negative for chest pain, palpitations, orthopnea, claudication, leg swelling, syncope and PND.  Gastrointestinal: Positive for constipation.  Negative for nausea, vomiting, abdominal pain, blood in stool and abdominal distention.  Genitourinary: Negative for dysuria, urgency, frequency, hematuria, flank pain, decreased urine volume and difficulty urinating.  Musculoskeletal: Positive for joint swelling and arthralgias. Negative for myalgias, back pain and gait problem.  Skin: Negative for color change, pallor and rash.  Neurological: Positive for weakness. Negative for dizziness, tremors, seizures, syncope, speech difficulty, light-headedness, numbness and headaches.  Hematological: Negative for adenopathy. Does not bruise/bleed easily.  Psychiatric/Behavioral: Positive for disturbed wake/sleep cycle. Negative for confusion and agitation. The patient is nervous/anxious.        Objective:   Physical Exam Filed Vitals:   04/15/12 1157  BP: 140/78  Pulse: 90  Temp: 98 F (36.7 C)  TempSrc: Oral  Height: 5\' 3"  (1.6 m)  Weight: 140 lb 8 oz (63.73 kg)  SpO2: 94%    Gen: Pleasant, well-nourished, in no distress,  normal affect  ENT: No lesions,  mouth clear,  oropharynx clear, no postnasal drip  Neck: No JVD, no TMG, no carotid bruits  Lungs: No use of accessory muscles, no dullness to percussion, distant breath sounds  Cardiovascular: RRR, heart sounds normal, no murmur or gallops, no peripheral edema  Abdomen: soft and NT, no HSM,  BS normal  Musculoskeletal: No deformities, no cyanosis or clubbing  Neuro: alert, non focal  Skin: Warm, no lesions or rashes  No results found. Chest x-ray showed airway hyperinflation Spirometry on 04/15/2012 reveals FEV1 57% FVC 94% FEV1 FVC ratio 45%      Assessment & Plan:   COPD Chronic obstructive lung disease gold stage B. COPD Ongoing airway obstruction Plan Start Symbicort two puff twice daily Stop Spiriva Use albuterol as needed Flu vaccine and pneumovax Overnight sleep oximetry will be obtained Return after above completed   Updated Medication  List Outpatient Encounter Prescriptions as of 04/15/2012  Medication Sig Dispense Refill  . aspirin 81 MG tablet Take 81 mg by mouth daily.        . cetirizine (ZYRTEC) 10 MG tablet Take 10 mg by mouth daily as needed.       . clorazepate (TRANXENE) 3.75 MG tablet Take one or two tablets by mouth at bedtime.  30 tablet  0  . Cyanocobalamin (VITAMIN B-12 IJ) Inject as directed as needed.      . vitamin E 400 UNIT capsule Take 400 Units by mouth daily.      Marland Kitchen DISCONTD: SPIRIVA HANDIHALER 18 MCG inhalation capsule INHALE THE CONTENTS OF 1 CAPSULE ONCE   DAILY  30 each  0  . albuterol (PROVENTIL HFA;VENTOLIN HFA) 108 (90 BASE) MCG/ACT inhaler Inhale 2 puffs into the lungs every 6 (six) hours as needed for wheezing or shortness of breath.  1 Inhaler  6  . budesonide-formoterol (SYMBICORT) 160-4.5 MCG/ACT inhaler Inhale 2 puffs into the lungs 2 (two) times  daily.  1 Inhaler  12  . DISCONTD: atorvastatin (LIPITOR) 40 MG tablet Take 1 tablet (40 mg total) by mouth daily.  30 tablet  2  . DISCONTD: Calcium Carbonate-Vitamin D ([employer]S CALCIUM/VIT D SOFT CHEWS) 600-400 MG-UNIT per chew tablet Chew 1 tablet by mouth daily.

## 2012-04-15 NOTE — Patient Instructions (Addendum)
Start Symbicort two puff twice daily Stop Spiriva Use albuterol as needed Flu vaccine and pneumovax recommended today An overnight oxygen test will be obtained Return 2 months

## 2012-04-16 NOTE — Assessment & Plan Note (Signed)
Chronic obstructive lung disease gold stage B. COPD Ongoing airway obstruction Plan Start Symbicort two puff twice daily Stop Spiriva Use albuterol as needed Flu vaccine and pneumovax Overnight sleep oximetry will be obtained Return after above completed

## 2012-04-21 ENCOUNTER — Telehealth: Payer: Self-pay | Admitting: Critical Care Medicine

## 2012-04-21 NOTE — Telephone Encounter (Signed)
LMTCB

## 2012-04-22 NOTE — Telephone Encounter (Signed)
Per pt instructions from 04/15/12 OV with Dr. Delford Field:  Patient Instructions     Start Symbicort two puff twice daily  Stop Spiriva  Use albuterol as needed  Flu vaccine and pneumovax recommended today  An overnight oxygen test will be obtained  Return 2 months    -------  [email or web address]

## 2012-04-23 NOTE — Telephone Encounter (Signed)
Spoke with pt. She states that the symbicort is causing throat irritation and sore throat. She states that she has not been rinsing her mouth at all after using inhaler. I advised that she should rinse mouth well and gargle after each use and may also help to use a tooth paste that contains baking soda. She states will see if this helps and call back in next wk to let us know if she is not improving. Pt states nothing further needed.

## 2012-04-29 ENCOUNTER — Encounter: Payer: Self-pay | Admitting: Internal Medicine

## 2012-05-03 ENCOUNTER — Encounter: Payer: Self-pay | Admitting: Internal Medicine

## 2012-05-05 DIAGNOSIS — H02829 Cysts of unspecified eye, unspecified eyelid: Secondary | ICD-10-CM | POA: Diagnosis not present

## 2012-06-11 ENCOUNTER — Ambulatory Visit (AMBULATORY_SURGERY_CENTER): Payer: Medicare Other

## 2012-06-11 VITALS — Ht 63.5 in | Wt 143.8 lb

## 2012-06-11 DIAGNOSIS — Z1211 Encounter for screening for malignant neoplasm of colon: Secondary | ICD-10-CM

## 2012-06-11 DIAGNOSIS — Z86010 Personal history of colonic polyps: Secondary | ICD-10-CM

## 2012-06-11 MED ORDER — NA SULFATE-K SULFATE-MG SULF 17.5-3.13-1.6 GM/177ML PO SOLN: 1.0000 | Freq: Once | ORAL | Status: DC

## 2012-06-11 NOTE — Progress Notes (Signed)
During her pre-visit, pt refused to take the Moviprep which was ordered.Pt states she could not drink all of the liquids prior to her previous colonoscopy. Pt was given Suprep instructions per her request, but was instructed to drink plenty of liquids. She will call our office if she has any problems.

## 2012-06-17 ENCOUNTER — Telehealth: Payer: Self-pay | Admitting: Internal Medicine

## 2012-06-17 MED ORDER — CLORAZEPATE DIPOTASSIUM 3.75 MG PO TABS: 3.7500 mg | ORAL_TABLET | ORAL | Status: DC | PRN

## 2012-06-17 NOTE — Telephone Encounter (Signed)
refilll done. Left msg on pt's vmail letting her know she must have an OV for future refills.

## 2012-06-17 NOTE — Telephone Encounter (Signed)
Call patient, needs office . Okay to prescribe #15, no refills.  Can't keep prescribing without office visit , let her know

## 2012-06-17 NOTE — Telephone Encounter (Signed)
Pt has not been seen within a year. OK to refill? 

## 2012-06-17 NOTE — Telephone Encounter (Signed)
Refill: Clorazepate 3.75 mg. Last fill 01-26-12

## 2012-06-21 ENCOUNTER — Ambulatory Visit: Payer: Medicare Other | Admitting: Critical Care Medicine

## 2012-06-24 ENCOUNTER — Telehealth: Payer: Self-pay | Admitting: Critical Care Medicine

## 2012-06-24 NOTE — Telephone Encounter (Signed)
noted 

## 2012-06-24 NOTE — Telephone Encounter (Signed)
Will forward msg to PW as FYI

## 2012-06-25 ENCOUNTER — Ambulatory Visit (AMBULATORY_SURGERY_CENTER): Payer: Medicare Other | Admitting: Internal Medicine

## 2012-06-25 ENCOUNTER — Encounter: Payer: Self-pay | Admitting: Internal Medicine

## 2012-06-25 VITALS — BP 126/73 | HR 86 | Temp 97.4°F | Resp 22 | Ht 63.5 in | Wt 143.0 lb

## 2012-06-25 DIAGNOSIS — D126 Benign neoplasm of colon, unspecified: Secondary | ICD-10-CM

## 2012-06-25 DIAGNOSIS — Z86010 Personal history of colonic polyps: Secondary | ICD-10-CM

## 2012-06-25 DIAGNOSIS — E785 Hyperlipidemia, unspecified: Secondary | ICD-10-CM | POA: Diagnosis not present

## 2012-06-25 DIAGNOSIS — J449 Chronic obstructive pulmonary disease, unspecified: Secondary | ICD-10-CM | POA: Diagnosis not present

## 2012-06-25 DIAGNOSIS — D649 Anemia, unspecified: Secondary | ICD-10-CM | POA: Diagnosis not present

## 2012-06-25 DIAGNOSIS — Z1211 Encounter for screening for malignant neoplasm of colon: Secondary | ICD-10-CM | POA: Diagnosis not present

## 2012-06-25 MED ORDER — SODIUM CHLORIDE 0.9 % IV SOLN: 500.0000 mL | INTRAVENOUS | Status: DC

## 2012-06-25 NOTE — Op Note (Signed)
[facility] [address] [place], [address]   COLONOSCOPY PROCEDURE REPORT  PATIENT: Mary Jordan, Mary Jordan  MR#: [id number] BIRTHDATE: [birth year] , 77  yrs. old GENDER: Female ENDOSCOPIST: Hart Carwin, MD REFERRED BY:  recall colonoscopy PROCEDURE DATE:  06/25/2012 PROCEDURE:   Colonoscopy with cold biopsy polypectomy ASA CLASS:   Class II INDICATIONS:patient's personal history of adenomatous colon polyps and last colon 2008- tubular adenoma and hyperplastic polyp. MEDICATIONS: MAC sedation, administered by CRNA and propofol (Diprivan) 450mg  IV  DESCRIPTION OF PROCEDURE:   After the risks and benefits and of the procedure were explained, informed consent was obtained.  A digital rectal exam revealed no abnormalities of the rectum.    The LB [id number]  endoscope was introduced through the anus and advanced to the cecum, which was identified by both the appendix and ileocecal valve .  The quality of the prep was good, using MoviPrep .  The instrument was then slowly withdrawn as the colon was fully examined.     COLON FINDINGS: A smooth sessile polyp ranging between 3-47mm in size was found at the cecum.  A polypectomy was performed with cold forceps.  The resection was complete and the polyp tissue was completely retrieved.     Retroflexed views revealed no abnormalities.     The scope was then withdrawn from the patient and the procedure completed.  COMPLICATIONS: There were no complications. ENDOSCOPIC IMPRESSION: Sessile polyp ranging between 3-12mm in size was found at the cecum; polypectomy was performed with cold forceps  RECOMMENDATIONS: Await pathology results   REPEAT EXAM: no recall due to age.  cc:  _______________________________ eSignedHart Carwin, MD 06/25/2012 10:08 AM

## 2012-06-25 NOTE — Patient Instructions (Addendum)

## 2012-06-25 NOTE — Progress Notes (Signed)
Patient did not have preoperative order for IV antibiotic SSI prophylaxis. (G8918)  Patient did not experience any of the following events: a burn prior to discharge; a fall within the facility; wrong site/side/patient/procedure/implant event; or a hospital transfer or hospital admission upon discharge from the facility. (G8907)  

## 2012-06-25 NOTE — Progress Notes (Addendum)
1015- c/o a "bad belly ache"  Pt too drowsy to rate.  Encourage to pass air; knees to chest.  Pt's abdomen soft, nondistended and pt able to pass air  1020- HOB down, rates abd. Pain as a "7."  Encouraged to pass air.  No change in above assessment  1027- Rates abd pain as a "3."  Still passing air  1035- Rates abd pain as a "1."  Pt has passed large amt of air in the RR

## 2012-06-28 ENCOUNTER — Telehealth: Payer: Self-pay

## 2012-06-28 NOTE — Telephone Encounter (Signed)
  Follow up Call-  Call back number 06/25/2012  Post procedure Call Back phone  # [phone]  Permission to leave phone message Yes     Patient questions:  Do you have a fever, pain , or abdominal swelling? no Pain Score  0 *  Have you tolerated food without any problems? yes  Have you been able to return to your normal activities? no  Do you have any questions about your discharge instructions: Diet   no Medications  no Follow up visit  no  Do you have questions or concerns about your Care? no  Actions: * If pain score is 4 or above: No action needed, pain <4.

## 2012-06-29 ENCOUNTER — Encounter: Payer: Self-pay | Admitting: Internal Medicine

## 2012-07-01 DIAGNOSIS — J449 Chronic obstructive pulmonary disease, unspecified: Secondary | ICD-10-CM | POA: Diagnosis not present

## 2012-07-07 ENCOUNTER — Telehealth: Payer: Self-pay | Admitting: Critical Care Medicine

## 2012-07-07 DIAGNOSIS — J449 Chronic obstructive pulmonary disease, unspecified: Secondary | ICD-10-CM

## 2012-07-07 NOTE — Telephone Encounter (Signed)
Call pt and tell her ONO on RA was NORMAL  No oxygen needed

## 2012-07-07 NOTE — Telephone Encounter (Signed)
Called, spoke with pt.  Informed her ONO on RA was normal; no o2 needed per PW.  She verbalized understanding.

## 2012-07-14 ENCOUNTER — Encounter: Payer: Self-pay | Admitting: Critical Care Medicine

## 2012-07-15 ENCOUNTER — Ambulatory Visit (INDEPENDENT_AMBULATORY_CARE_PROVIDER_SITE_OTHER): Payer: Medicare Other | Admitting: Critical Care Medicine

## 2012-07-15 ENCOUNTER — Encounter: Payer: Self-pay | Admitting: Critical Care Medicine

## 2012-07-15 VITALS — BP 132/84 | HR 86 | Temp 98.0°F | Ht 63.5 in | Wt 141.0 lb

## 2012-07-15 DIAGNOSIS — J449 Chronic obstructive pulmonary disease, unspecified: Secondary | ICD-10-CM | POA: Diagnosis not present

## 2012-07-15 MED ORDER — BUDESONIDE-FORMOTEROL FUMARATE 160-4.5 MCG/ACT IN AERO: 2.0000 | INHALATION_SPRAY | Freq: Two times a day (BID) | RESPIRATORY_TRACT | Status: DC

## 2012-07-15 NOTE — Assessment & Plan Note (Signed)
Gold stage C. COPD stable at this time Plan Samples of Symbicort return to the patient and she is now in her doughnut hole

## 2012-07-15 NOTE — Progress Notes (Signed)
Subjective:    Patient ID: Mary Jordan, female    DOB: 18-Jun-1935, 77 y.o.   MRN: [id number]  HPI 07/15/2012 At last ov we: Chronic obstructive lung disease gold stage B. COPD Ongoing airway obstruction Plan Start Symbicort two puff twice daily Stop Spiriva Use albuterol as needed Flu vaccine and pneumovax Overnight sleep oximetry will be obtained Return after above completed  Pt in the donut hole and cannot afford. The symbicort was helping.    Past Medical History  Diagnosis Date  . Lung mass     s/p excision of RUL benign 2007  . COPD (chronic obstructive pulmonary disease)   . Osteoporosis   . Osteoarthritis   . Hyperlipidemia   . Anemia   . B12 deficiency   . Menopause   . Insomnia   . Allergic rhinitis      Family History  Problem Relation Age of Onset  . Heart attack Father 84  . Heart disease Father   . Dementia Mother   . Breast cancer Mother   . Asthma Mother   . Diabetes Neg Hx   . Stroke Neg Hx   . Colon cancer Neg Hx      History   Social History  . Marital Status: Divorced    Spouse Name: N/A    Number of Children: 2  . Years of Education: N/A   Occupational History  . Retired     Diplomatic Services operational officer   Social History Main Topics  . Smoking status: Former Smoker -- 1.0 packs/day for 39 years    Types: Cigarettes    Quit date: 08/11/1996  . Smokeless tobacco: Never Used     Comment: Using nicorette gum  . Alcohol Use: 3.6 oz/week    3 Cans of beer, 3 Glasses of wine per week     Comment: three times weekly  . Drug Use: No  . Sexually Active: Not on file   Other Topics Concern  . Not on file   Social History Narrative   Lives by herselfHas 2 children, 4 gkids all boys Totally independent on her ADL     Allergies  Allergen Reactions  . Levofloxacin     REACTION: hives on chest     Outpatient Prescriptions Prior to Visit  Medication Sig Dispense Refill  . albuterol (PROVENTIL HFA;VENTOLIN HFA) 108 (90 BASE) MCG/ACT inhaler  Inhale 2 puffs into the lungs every 6 (six) hours as needed for wheezing or shortness of breath.  1 Inhaler  6  . aspirin 81 MG tablet Take 81 mg by mouth daily.        . clorazepate (TRANXENE) 3.75 MG tablet Take 1 tablet (3.75 mg total) by mouth as needed.  15 tablet  0  . Cyanocobalamin (VITAMIN B-12 IJ) Inject as directed as needed.      . vitamin E 400 UNIT capsule Take 400 Units by mouth daily.      . budesonide-formoterol (SYMBICORT) 160-4.5 MCG/ACT inhaler Inhale 2 puffs into the lungs 2 (two) times daily.  1 Inhaler  12  . cetirizine (ZYRTEC) 10 MG tablet Take 10 mg by mouth daily as needed.            Review of Systems  Constitutional: Positive for fatigue. Negative for chills, diaphoresis, activity change, appetite change and unexpected weight change.  HENT: Positive for nosebleeds, sneezing and tinnitus. Negative for hearing loss, congestion, facial swelling, mouth sores, trouble swallowing, neck stiffness, dental problem, voice change, postnasal drip, sinus pressure and ear  discharge.   Eyes: Negative for photophobia, discharge, itching and visual disturbance.  Respiratory: Negative for apnea, cough, choking, chest tightness and stridor.   Cardiovascular: Negative for palpitations.  Gastrointestinal: Positive for constipation. Negative for nausea, blood in stool and abdominal distention.  Genitourinary: Negative for dysuria, urgency, frequency, hematuria, flank pain, decreased urine volume and difficulty urinating.  Musculoskeletal: Positive for joint swelling and arthralgias. Negative for myalgias, back pain and gait problem.  Skin: Negative for color change and pallor.  Neurological: Positive for weakness. Negative for dizziness, tremors, seizures, syncope, speech difficulty, light-headedness and numbness.  Hematological: Negative for adenopathy. Does not bruise/bleed easily.  Psychiatric/Behavioral: Positive for sleep disturbance. Negative for confusion and agitation. The  patient is nervous/anxious.        Objective:   Physical Exam  Filed Vitals:   07/15/12 0931  BP: 132/84  Pulse: 86  Temp: 98 F (36.7 C)  TempSrc: Oral  Height: 5' 3.5" (1.613 m)  Weight: 141 lb (63.957 kg)  SpO2: 96%    Gen: Pleasant, well-nourished, in no distress,  normal affect  ENT: No lesions,  mouth clear,  oropharynx clear, no postnasal drip  Neck: No JVD, no TMG, no carotid bruits  Lungs: No use of accessory muscles, no dullness to percussion, distant breath sounds  Cardiovascular: RRR, heart sounds normal, no murmur or gallops, no peripheral edema  Abdomen: soft and NT, no HSM,  BS normal  Musculoskeletal: No deformities, no cyanosis or clubbing  Neuro: alert, non focal  Skin: Warm, no lesions or rashes  No results found. Chest x-ray showed airway hyperinflation Spirometry on 04/15/2012 reveals FEV1 57% FVC 94% FEV1 FVC ratio 45%      Assessment & Plan:   COPD Gold stage C. COPD stable at this time Plan Samples of Symbicort return to the patient and she is now in her doughnut hole    Updated Medication List Outpatient Encounter Prescriptions as of 07/15/2012  Medication Sig Dispense Refill  . albuterol (PROVENTIL HFA;VENTOLIN HFA) 108 (90 BASE) MCG/ACT inhaler Inhale 2 puffs into the lungs every 6 (six) hours as needed for wheezing or shortness of breath.  1 Inhaler  6  . aspirin 81 MG tablet Take 81 mg by mouth daily.        . clorazepate (TRANXENE) 3.75 MG tablet Take 1 tablet (3.75 mg total) by mouth as needed.  15 tablet  0  . Cyanocobalamin (VITAMIN B-12 IJ) Inject as directed as needed.      . vitamin E 400 UNIT capsule Take 400 Units by mouth daily.      . budesonide-formoterol (SYMBICORT) 160-4.5 MCG/ACT inhaler Inhale 2 puffs into the lungs 2 (two) times daily.  1 Inhaler  12  . budesonide-formoterol (SYMBICORT) 160-4.5 MCG/ACT inhaler Inhale 2 puffs into the lungs 2 (two) times daily.  3 Inhaler  0  . [DISCONTINUED] cetirizine (ZYRTEC)  10 MG tablet Take 10 mg by mouth daily as needed.

## 2012-07-15 NOTE — Patient Instructions (Addendum)
Use samples of symbicort two puff twice daily Return 4 months

## 2012-07-23 DIAGNOSIS — L821 Other seborrheic keratosis: Secondary | ICD-10-CM | POA: Diagnosis not present

## 2012-07-23 DIAGNOSIS — Z85828 Personal history of other malignant neoplasm of skin: Secondary | ICD-10-CM | POA: Diagnosis not present

## 2012-07-23 DIAGNOSIS — L820 Inflamed seborrheic keratosis: Secondary | ICD-10-CM | POA: Diagnosis not present

## 2012-08-02 ENCOUNTER — Encounter: Payer: Self-pay | Admitting: Internal Medicine

## 2012-08-02 ENCOUNTER — Ambulatory Visit (INDEPENDENT_AMBULATORY_CARE_PROVIDER_SITE_OTHER): Payer: Medicare Other | Admitting: Internal Medicine

## 2012-08-02 VITALS — BP 118/80 | HR 87 | Temp 97.9°F | Ht 64.0 in | Wt 140.0 lb

## 2012-08-02 DIAGNOSIS — D518 Other vitamin B12 deficiency anemias: Secondary | ICD-10-CM

## 2012-08-02 DIAGNOSIS — E785 Hyperlipidemia, unspecified: Secondary | ICD-10-CM | POA: Diagnosis not present

## 2012-08-02 DIAGNOSIS — Z0000 Encounter for general adult medical examination without abnormal findings: Secondary | ICD-10-CM | POA: Diagnosis not present

## 2012-08-02 DIAGNOSIS — G4700 Insomnia, unspecified: Secondary | ICD-10-CM

## 2012-08-02 DIAGNOSIS — M810 Age-related osteoporosis without current pathological fracture: Secondary | ICD-10-CM | POA: Diagnosis not present

## 2012-08-02 LAB — CBC WITH DIFFERENTIAL/PLATELET
Eosinophils Relative: 2.4 % (ref 0.0–5.0)
HCT: 42.5 % (ref 36.0–46.0)
Hemoglobin: 14.3 g/dL (ref 12.0–15.0)
Lymphs Abs: 1.6 10*3/uL (ref 0.7–4.0)
Monocytes Relative: 6.5 % (ref 3.0–12.0)
Neutro Abs: 4.3 10*3/uL (ref 1.4–7.7)
Platelets: 336 10*3/uL (ref 150.0–400.0)
RBC: 4.86 Mil/uL (ref 3.87–5.11)
WBC: 6.6 10*3/uL (ref 4.5–10.5)

## 2012-08-02 LAB — LIPID PANEL
Cholesterol: 252 mg/dL — ABNORMAL HIGH (ref 0–200)
Total CHOL/HDL Ratio: 5
VLDL: 18.2 mg/dL (ref 0.0–40.0)

## 2012-08-02 LAB — BASIC METABOLIC PANEL
BUN: 12 mg/dL (ref 6–23)
CO2: 25 mEq/L (ref 19–32)
Calcium: 9.3 mg/dL (ref 8.4–10.5)
Chloride: 106 mEq/L (ref 96–112)
Creatinine, Ser: 0.6 mg/dL (ref 0.4–1.2)
GFR: 103.5 mL/min (ref >60.00)
Glucose, Bld: 99 mg/dL (ref 70–99)
Potassium: 3.7 mEq/L (ref 3.5–5.1)
Sodium: 138 mEq/L (ref 135–145)

## 2012-08-02 LAB — AST: AST: 19 U/L (ref 0–37)

## 2012-08-02 LAB — LDL CHOLESTEROL, DIRECT: Direct LDL: 186.2 mg/dL

## 2012-08-02 NOTE — Assessment & Plan Note (Addendum)
Td 2008 pneumonia shot 2008, 2013 had the  shingles shot 2009 Had a flu shot   MMG 3-11 , 2-12, 2-13  neg  , breast exam today ok last PAP long ago, has not seen a gyn in a while, her previous doctor told her she does not need PAPs anymore. No h/o abnormal PAPs   Cscope 03-2008 Dr Juanda Chance 2 polyps (Hyperplastic and tubular adenoma) , colonoscopy again 11-15- 2013, 1 polyp. Next per GI Diet, exercise  Discussed

## 2012-08-02 NOTE — Progress Notes (Signed)
Subjective:    Patient ID: Mary Jordan, female    DOB: 1935-08-05, 77 y.o.   MRN: [id number]  HPI  Here for Medicare AWV: 1. Risk factors based on Past M, S, F history: reviewed 2. Physical Activities: does home chores, yard works, [employer] twice a week 3. Depression/mood:  (-) screening  4. Hearing:  No problems noted or reported   5. ADL's: independent, still drives   6. Fall Risk: no recent problems, average risk 7. home Safety: does feel safe at home   8. Height, weight, &visual acuity: see VS, s/p cataract surgery B 9. Counseling: provided 10. Labs ordered based on risk factors: if needed   11. Referral Coordination: if needed 12.  Care Plan, see assessment and plan   13.   Cognitive Assessment: Motor skill and cognition within normal  In addition, today we discussed the following: Insomnia, well-controlled with clorazepate when necessary. COPD, closely follow up by pulmonary, good compliance with medications, has not needed albuterol in a while. B12 deficiency-- good compliance with injections Osteoporosis, due for labs, not taking calcium or vitamin D  Past Medical History  Diagnosis Date  . Lung mass     s/p excision of RUL benign 2007  . COPD (chronic obstructive pulmonary disease)   . Osteoporosis   . Osteoarthritis   . Hyperlipidemia   . Anemia   . B12 deficiency   . Menopause   . Insomnia   . Allergic rhinitis    Past Surgical History  Procedure Date  . Abdominal hysterectomy   . Breast biopsy     remotely, neg  . Lesion from lung excised, benign 05-2006  . Cardiovascular stress test 2007    Cardiolite negative  . Cataract extraction, bilateral    History   Social History  . Marital Status: Divorced    Spouse Name: N/A    Number of Children: 2  . Years of Education: N/A   Occupational History  . Retired     Diplomatic Services operational officer   Social History Main Topics  . Smoking status: Former Smoker -- 1.0 packs/day for 39 years    Types: Cigarettes    Quit  date: 08/11/1996  . Smokeless tobacco: Never Used     Comment: Using nicorette gum  . Alcohol Use: 3.6 oz/week    3 Cans of beer, 3 Glasses of wine per week     Comment: three times weekly  . Drug Use: No  . Sexually Active: Not on file   Other Topics Concern  . Not on file   Social History Narrative   Lives by herself ---Has 2 children, 4 gkids all boys  ---Totally independent on her ADL   Family History  Problem Relation Age of Onset  . Heart attack Father 75  . Hyperlipidemia Neg Hx   . Dementia Mother   . Breast cancer Mother   . Asthma Mother   . Diabetes Neg Hx   . Stroke Neg Hx   . Colon cancer Neg Hx     Review of Systems No chest pain extremity edema or palpitations Denies nausea, vomiting, diarrhea blood in the stools No dysuria, gross hematuria, vaginal discharge or vaginal bleeding.     Objective:   Physical Exam General -- alert, well-developed, and well-nourished.   Neck --no thyromegaly , normal carotid pulse Breasts-- No mass, nodules, thickening, tenderness, bulging, retraction, inflamation, nipple discharge or skin changes noted.  no axillary lymph nodes Lungs -- normal respiratory effort, no intercostal retractions, no  accessory muscle use, decreased breath sounds.  Heart-- normal rate, regular rhythm, no murmur, and no gallop.   Abdomen--soft, non-tender, no distention, no masses, no HSM, no guarding, and no rigidity. No bruit  Extremities-- no pretibial edema bilaterally Psych-- Cognition and judgment appear intact. Alert and cooperative with normal attention span and concentration.  not anxious appearing and not depressed appearing.      Assessment & Plan:

## 2012-08-02 NOTE — Assessment & Plan Note (Signed)
Last dexa 10-10, T score -2.4 similar to previous --- schedule a bone density test intolerant to fosamax, actonel: $$ Due  for a reclast  --will schedule vit d levels normal in the past Recommend calcium, vitamin D and stay active

## 2012-08-02 NOTE — Patient Instructions (Addendum)
Take calcium supplements daily, also vitamin D between 600 units an hour 100 units a day. Stay active Next visit in one year.  Fall Prevention and Home Safety Falls cause injuries and can affect all age groups. It is possible to use preventive measures to significantly decrease the likelihood of falls. There are many simple measures which can make your home safer and prevent falls. OUTDOORS  Repair cracks and edges of walkways and driveways.  Remove high doorway thresholds.  Trim shrubbery on the main path into your home.  Have good outside lighting.  Clear walkways of tools, rocks, debris, and clutter.  Check that handrails are not broken and are securely fastened. Both sides of steps should have handrails.  Have leaves, snow, and ice cleared regularly.  Use sand or salt on walkways during winter months.  In the garage, clean up grease or oil spills. BATHROOM  Install night lights.  Install grab bars by the toilet and in the tub and shower.  Use non-skid mats or decals in the tub or shower.  Place a plastic non-slip stool in the shower to sit on, if needed.  Keep floors dry and clean up all water on the floor immediately.  Remove soap buildup in the tub or shower on a regular basis.  Secure bath mats with non-slip, double-sided rug tape.  Remove throw rugs and tripping hazards from the floors. BEDROOMS  Install night lights.  Make sure a bedside light is easy to reach.  Do not use oversized bedding.  Keep a telephone by your bedside.  Have a firm chair with side arms to use for getting dressed.  Remove throw rugs and tripping hazards from the floor. KITCHEN  Keep handles on pots and pans turned toward the center of the stove. Use back burners when possible.  Clean up spills quickly and allow time for drying.  Avoid walking on wet floors.  Avoid hot utensils and knives.  Position shelves so they are not too high or low.  Place commonly used objects  within easy reach.  If necessary, use a sturdy step stool with a grab bar when reaching.  Keep electrical cables out of the way.  Do not use floor polish or wax that makes floors slippery. If you must use wax, use non-skid floor wax.  Remove throw rugs and tripping hazards from the floor. STAIRWAYS  Never leave objects on stairs.  Place handrails on both sides of stairways and use them. Fix any loose handrails. Make sure handrails on both sides of the stairways are as long as the stairs.  Check carpeting to make sure it is firmly attached along stairs. Make repairs to worn or loose carpet promptly.  Avoid placing throw rugs at the top or bottom of stairways, or properly secure the rug with carpet tape to prevent slippage. Get rid of throw rugs, if possible.  Have an electrician put in a light switch at the top and bottom of the stairs. OTHER FALL PREVENTION TIPS  Wear low-heel or rubber-soled shoes that are supportive and fit well. Wear closed toe shoes.  When using a stepladder, make sure it is fully opened and both spreaders are firmly locked. Do not climb a closed stepladder.  Add color or contrast paint or tape to grab bars and handrails in your home. Place contrasting color strips on first and last steps.  Learn and use mobility aids as needed. Install an electrical emergency response system.  Turn on lights to avoid dark areas. Replace  light bulbs that burn out immediately. Get light switches that glow.  Arrange furniture to create clear pathways. Keep furniture in the same place.  Firmly attach carpet with non-skid or double-sided tape.  Eliminate uneven floor surfaces.  Select a carpet pattern that does not visually hide the edge of steps.  Be aware of all pets. OTHER HOME SAFETY TIPS  Set the water temperature for 120 F (48.8 C).  Keep emergency numbers on or near the telephone.  Keep smoke detectors on every level of the home and near sleeping  areas. Document Released: 07/18/2002 Document Revised: 01/27/2012 Document Reviewed: 10/17/2011 [facility] Patient Information 2013 [place], [place].

## 2012-08-02 NOTE — Assessment & Plan Note (Signed)
Refill clorazepate prn

## 2012-08-02 NOTE — Assessment & Plan Note (Signed)
Currently on no medications. "Didn't feel right" with Lipitor. Labs

## 2012-08-02 NOTE — Assessment & Plan Note (Signed)
Reports good compliance with shots

## 2012-08-03 MED ORDER — PRAVASTATIN SODIUM 40 MG PO TABS: 40.0000 mg | ORAL_TABLET | Freq: Every day | ORAL | Status: DC

## 2012-08-03 NOTE — Addendum Note (Signed)
Addended by: Edwena Felty T on: 08/03/2012 11:21 AM   Modules accepted: Orders

## 2012-08-10 ENCOUNTER — Ambulatory Visit (INDEPENDENT_AMBULATORY_CARE_PROVIDER_SITE_OTHER)
Admission: RE | Admit: 2012-08-10 | Discharge: 2012-08-10 | Disposition: A | Discharge status: Home / Self Care | Payer: Medicare Other | Source: Ambulatory Visit

## 2012-08-10 DIAGNOSIS — M810 Age-related osteoporosis without current pathological fracture: Secondary | ICD-10-CM

## 2012-08-19 ENCOUNTER — Other Ambulatory Visit: Payer: Self-pay | Admitting: Internal Medicine

## 2012-08-19 NOTE — Telephone Encounter (Signed)
Done

## 2012-08-19 NOTE — Telephone Encounter (Signed)
Ok to refill? Last OV 12.23.13 Last filled 11.7.13

## 2012-09-08 ENCOUNTER — Telehealth: Payer: Self-pay | Admitting: Internal Medicine

## 2012-09-08 NOTE — Telephone Encounter (Signed)
Advice patient, recent bone density test showed a T score of -2.2, she is improving. I recommend to continue reclast. I ordered an infusion of reclast last week of  December, if that has not happened, please arrange.

## 2012-09-09 NOTE — Telephone Encounter (Signed)
Discussed with pt. She states that she does want to continue with reclast but she is going to check to see if her insurance will cover it. Pt states when she finds out she will call to let us know.

## 2012-10-27 ENCOUNTER — Other Ambulatory Visit: Payer: Self-pay | Admitting: Internal Medicine

## 2012-10-28 ENCOUNTER — Ambulatory Visit (HOSPITAL_BASED_OUTPATIENT_CLINIC_OR_DEPARTMENT_OTHER)
Admission: RE | Admit: 2012-10-28 | Discharge: 2012-10-28 | Disposition: A | Discharge status: Home / Self Care | Payer: Medicare Other | Source: Ambulatory Visit | Attending: Internal Medicine | Admitting: Internal Medicine

## 2012-10-28 DIAGNOSIS — Z1231 Encounter for screening mammogram for malignant neoplasm of breast: Secondary | ICD-10-CM | POA: Diagnosis not present

## 2012-11-25 ENCOUNTER — Ambulatory Visit (INDEPENDENT_AMBULATORY_CARE_PROVIDER_SITE_OTHER): Payer: Medicare Other | Admitting: Critical Care Medicine

## 2012-11-25 ENCOUNTER — Encounter: Payer: Self-pay | Admitting: Critical Care Medicine

## 2012-11-25 VITALS — BP 138/86 | HR 87 | Temp 98.0°F | Ht 63.5 in | Wt 140.0 lb

## 2012-11-25 DIAGNOSIS — J449 Chronic obstructive pulmonary disease, unspecified: Secondary | ICD-10-CM | POA: Diagnosis not present

## 2012-11-25 NOTE — Patient Instructions (Signed)
No change in medications Return 6 months or sooner if needed

## 2012-11-25 NOTE — Progress Notes (Signed)
Subjective:    Patient ID: Mary Jordan, female    DOB: [birth year], 78 y.o.   MRN: [id number]  HPI 78 y.o.WF Gold C Copd    11/25/2012 Still on symbicort.  No real change. No real mucus or cough. Note very low CAT Score CAT Score 11/25/2012 07/15/2012  Total CAT Score 4 5  Pt denies any significant sore throat, nasal congestion or excess secretions, fever, chills, sweats, unintended weight loss, pleurtic or exertional chest pain, orthopnea PND, or leg swelling Pt denies any increase in rescue therapy over baseline, denies waking up needing it or having any early am or nocturnal exacerbations of coughing/wheezing/or dyspnea. Pt also denies any obvious fluctuation in symptoms with  weather or environmental change or other alleviating or aggravating factors  Past Medical History  Diagnosis Date  . Lung mass     s/p excision of RUL benign 2007  . COPD (chronic obstructive pulmonary disease)   . Osteoporosis   . Osteoarthritis   . Hyperlipidemia   . Anemia   . B12 deficiency   . Menopause   . Insomnia   . Allergic rhinitis      Family History  Problem Relation Age of Onset  . Heart attack Father 39  . Hyperlipidemia Neg Hx   . Dementia Mother   . Breast cancer Mother   . Asthma Mother   . Diabetes Neg Hx   . Stroke Neg Hx   . Colon cancer Neg Hx      History   Social History  . Marital Status: Divorced    Spouse Name: N/A    Number of Children: 2  . Years of Education: N/A   Occupational History  . Retired     Diplomatic Services operational officer   Social History Main Topics  . Smoking status: Former Smoker -- 1.00 packs/day for 39 years    Types: Cigarettes    Quit date: 08/11/1996  . Smokeless tobacco: Never Used     Comment: Using nicorette gum  . Alcohol Use: 3.6 oz/week    3 Cans of beer, 3 Glasses of wine per week     Comment: three times weekly  . Drug Use: No  . Sexually Active: Not on file   Other Topics Concern  . Not on file   Social History Narrative   Lives by  herself ---   Has 2 children, 4 gkids all boys  ---   Totally independent on her ADL              Allergies  Allergen Reactions  . Levofloxacin     REACTION: hives on chest     Outpatient Prescriptions Prior to Visit  Medication Sig Dispense Refill  . albuterol (PROVENTIL HFA;VENTOLIN HFA) 108 (90 BASE) MCG/ACT inhaler Inhale 2 puffs into the lungs every 6 (six) hours as needed for wheezing or shortness of breath.  1 Inhaler  6  . aspirin 81 MG tablet Take 81 mg by mouth daily.        . budesonide-formoterol (SYMBICORT) 160-4.5 MCG/ACT inhaler Inhale 2 puffs into the lungs 2 (two) times daily.  3 Inhaler  0  . clorazepate (TRANXENE) 3.75 MG tablet Take 1 tablet (3.75 mg total) by mouth at bedtime as needed for sleep.  30 tablet  4  . Cyanocobalamin (VITAMIN B-12 IJ) Inject as directed as needed.      . vitamin E 400 UNIT capsule Take 400 Units by mouth daily.      . pravastatin (PRAVACHOL) 40  MG tablet Take 1 tablet (40 mg total) by mouth daily.  90 tablet  0   No facility-administered medications prior to visit.       Review of Systems  Constitutional: Positive for fatigue. Negative for chills, diaphoresis, activity change, appetite change and unexpected weight change.  HENT: Positive for nosebleeds, sneezing and tinnitus. Negative for hearing loss, congestion, facial swelling, mouth sores, trouble swallowing, neck stiffness, dental problem, voice change, postnasal drip, sinus pressure and ear discharge.   Eyes: Negative for photophobia, discharge, itching and visual disturbance.  Respiratory: Negative for apnea, cough, choking, chest tightness and stridor.   Cardiovascular: Negative for palpitations.  Gastrointestinal: Positive for constipation. Negative for nausea, blood in stool and abdominal distention.  Genitourinary: Negative for dysuria, urgency, frequency, hematuria, flank pain, decreased urine volume and difficulty urinating.  Musculoskeletal: Positive for joint  swelling and arthralgias. Negative for myalgias, back pain and gait problem.  Skin: Negative for color change and pallor.  Neurological: Positive for weakness. Negative for dizziness, tremors, seizures, syncope, speech difficulty, light-headedness and numbness.  Hematological: Negative for adenopathy. Does not bruise/bleed easily.  Psychiatric/Behavioral: Positive for sleep disturbance. Negative for confusion and agitation. The patient is nervous/anxious.        Objective:   Physical Exam  Filed Vitals:   11/25/12 1359  BP: 138/86  Pulse: 87  Temp: 98 F (36.7 C)  TempSrc: Oral  Height: 5' 3.5" (1.613 m)  Weight: 140 lb (63.504 kg)  SpO2: 94%    Gen: Pleasant, well-nourished, in no distress,  normal affect  ENT: No lesions,  mouth clear,  oropharynx clear, no postnasal drip  Neck: No JVD, no TMG, no carotid bruits  Lungs: No use of accessory muscles, no dullness to percussion, distant breath sounds  Cardiovascular: RRR, heart sounds normal, no murmur or gallops, no peripheral edema  Abdomen: soft and NT, no HSM,  BS normal  Musculoskeletal: No deformities, no cyanosis or clubbing  Neuro: alert, non focal  Skin: Warm, no lesions or rashes  No results found. Chest x-ray showed airway hyperinflation Spirometry on 04/15/2012 reveals FEV1 57% FVC 94% FEV1 FVC ratio 45%      Assessment & Plan:   COPD gold stage C. Gold Stage C Copd stable at present Plan No change in inhaled or maintenance medications. Return in  6 months Note no desat on walk     Updated Medication List Outpatient Encounter Prescriptions as of 11/25/2012  Medication Sig Dispense Refill  . albuterol (PROVENTIL HFA;VENTOLIN HFA) 108 (90 BASE) MCG/ACT inhaler Inhale 2 puffs into the lungs every 6 (six) hours as needed for wheezing or shortness of breath.  1 Inhaler  6  . aspirin 81 MG tablet Take 81 mg by mouth daily.        . budesonide-formoterol (SYMBICORT) 160-4.5 MCG/ACT inhaler Inhale 2  puffs into the lungs 2 (two) times daily.  3 Inhaler  0  . clorazepate (TRANXENE) 3.75 MG tablet Take 1 tablet (3.75 mg total) by mouth at bedtime as needed for sleep.  30 tablet  4  . Cyanocobalamin (VITAMIN B-12 IJ) Inject as directed as needed.      . vitamin E 400 UNIT capsule Take 400 Units by mouth daily.      . [DISCONTINUED] pravastatin (PRAVACHOL) 40 MG tablet Take 1 tablet (40 mg total) by mouth daily.  90 tablet  0   No facility-administered encounter medications on file as of 11/25/2012.

## 2012-11-26 NOTE — Assessment & Plan Note (Signed)
Gold Stage C Copd stable at present Plan No change in inhaled or maintenance medications. Return in  6 months Note no desat on walk

## 2013-04-04 DIAGNOSIS — L723 Sebaceous cyst: Secondary | ICD-10-CM | POA: Diagnosis not present

## 2013-04-22 ENCOUNTER — Other Ambulatory Visit: Payer: Self-pay | Admitting: *Deleted

## 2013-04-22 MED ORDER — BUDESONIDE-FORMOTEROL FUMARATE 160-4.5 MCG/ACT IN AERO: 2.0000 | INHALATION_SPRAY | Freq: Two times a day (BID) | RESPIRATORY_TRACT | Status: DC

## 2013-05-27 DIAGNOSIS — L820 Inflamed seborrheic keratosis: Secondary | ICD-10-CM | POA: Diagnosis not present

## 2013-05-27 DIAGNOSIS — D485 Neoplasm of uncertain behavior of skin: Secondary | ICD-10-CM | POA: Diagnosis not present

## 2013-05-27 DIAGNOSIS — Z85828 Personal history of other malignant neoplasm of skin: Secondary | ICD-10-CM | POA: Diagnosis not present

## 2013-05-28 ENCOUNTER — Other Ambulatory Visit: Payer: Self-pay | Admitting: Internal Medicine

## 2013-05-30 ENCOUNTER — Telehealth: Payer: Self-pay | Admitting: *Deleted

## 2013-05-30 ENCOUNTER — Ambulatory Visit (INDEPENDENT_AMBULATORY_CARE_PROVIDER_SITE_OTHER): Payer: Medicare Other | Admitting: Critical Care Medicine

## 2013-05-30 ENCOUNTER — Encounter: Payer: Self-pay | Admitting: Critical Care Medicine

## 2013-05-30 VITALS — BP 140/82 | HR 77 | Temp 98.3°F | Ht 63.5 in | Wt 139.0 lb

## 2013-05-30 DIAGNOSIS — Z23 Encounter for immunization: Secondary | ICD-10-CM

## 2013-05-30 DIAGNOSIS — J449 Chronic obstructive pulmonary disease, unspecified: Secondary | ICD-10-CM | POA: Diagnosis not present

## 2013-05-30 MED ORDER — CLORAZEPATE DIPOTASSIUM 3.75 MG PO TABS: 3.7500 mg | ORAL_TABLET | Freq: Every evening | ORAL | Status: DC | PRN

## 2013-05-30 NOTE — Patient Instructions (Signed)
No change in medications. Return in     1 year Flu vaccine was given

## 2013-05-30 NOTE — Telephone Encounter (Signed)
Ok to [employer] and a UDS , please set up, no urgent

## 2013-05-30 NOTE — Telephone Encounter (Signed)
Called and left message for patient to return our call regarding UDS and Medication contract.

## 2013-05-30 NOTE — Telephone Encounter (Signed)
clorazepate (TRANXENE) 3.75 MG tablet PRN qhs Last OV: 08/02/2012 Please advise

## 2013-05-31 NOTE — Progress Notes (Signed)
Subjective:    Patient ID: Mary Jordan, female    DOB: 15-Oct-1934, 78 y.o.   MRN: [id number]  HPI 78 y.o.WF Darla Lesches Copd    05/31/2013 Chief Complaint  Patient presents with  . 6 month follow up    Breathing unchanged.  Has DOE and SOB when anxious.  No wheezing, chest tightness, chest pain, or cough at this time.   No real changes in dyspnea. Pt denies any significant sore throat, nasal congestion or excess secretions, fever, chills, sweats, unintended weight loss, pleurtic or exertional chest pain, orthopnea PND, or leg swelling Pt denies any increase in rescue therapy over baseline, denies waking up needing it or having any early am or nocturnal exacerbations of coughing/wheezing/or dyspnea. Pt also denies any obvious fluctuation in symptoms with  weather or environmental change or other alleviating or aggravating factors   Past Medical History  Diagnosis Date  . Lung mass     s/p excision of RUL benign 2007  . COPD (chronic obstructive pulmonary disease)   . Osteoporosis   . Osteoarthritis   . Hyperlipidemia   . Anemia   . B12 deficiency   . Menopause   . Insomnia   . Allergic rhinitis      Family History  Problem Relation Age of Onset  . Heart attack Father 56  . Hyperlipidemia Neg Hx   . Dementia Mother   . Breast cancer Mother   . Asthma Mother   . Diabetes Neg Hx   . Stroke Neg Hx   . Colon cancer Neg Hx      History   Social History  . Marital Status: Divorced    Spouse Name: N/A    Number of Children: 2  . Years of Education: N/A   Occupational History  . Retired     Diplomatic Services operational officer   Social History Main Topics  . Smoking status: Former Smoker -- 1.00 packs/day for 39 years    Types: Cigarettes    Quit date: 08/11/1996  . Smokeless tobacco: Never Used     Comment: Using nicorette gum  . Alcohol Use: 3.6 oz/week    3 Cans of beer, 3 Glasses of wine per week     Comment: three times weekly  . Drug Use: No  . Sexual Activity: Not on file    Other Topics Concern  . Not on file   Social History Narrative   Lives by herself ---   Has 2 children, 4 gkids all boys  ---   Totally independent on her ADL              Allergies  Allergen Reactions  . Levofloxacin     REACTION: hives on chest     Outpatient Prescriptions Prior to Visit  Medication Sig Dispense Refill  . albuterol (PROVENTIL HFA;VENTOLIN HFA) 108 (90 BASE) MCG/ACT inhaler Inhale 2 puffs into the lungs every 6 (six) hours as needed for wheezing or shortness of breath.  1 Inhaler  6  . aspirin 81 MG tablet Take 81 mg by mouth daily.        . budesonide-formoterol (SYMBICORT) 160-4.5 MCG/ACT inhaler Inhale 2 puffs into the lungs 2 (two) times daily.  1 Inhaler  5  . Cyanocobalamin (VITAMIN B-12 IJ) Inject as directed as needed.      . vitamin E 400 UNIT capsule Take 400 Units by mouth daily.       No facility-administered medications prior to visit.       Review  of Systems  Constitutional: Positive for fatigue. Negative for chills, diaphoresis, activity change, appetite change and unexpected weight change.  HENT: Positive for nosebleeds, sneezing and tinnitus. Negative for congestion, dental problem, ear discharge, facial swelling, hearing loss, mouth sores, postnasal drip, sinus pressure, trouble swallowing and voice change.   Eyes: Negative for photophobia, discharge, itching and visual disturbance.  Respiratory: Negative for apnea, cough, choking, chest tightness and stridor.   Cardiovascular: Negative for palpitations.  Gastrointestinal: Positive for constipation. Negative for nausea, blood in stool and abdominal distention.  Genitourinary: Negative for dysuria, urgency, frequency, hematuria, flank pain, decreased urine volume and difficulty urinating.  Musculoskeletal: Positive for arthralgias and joint swelling. Negative for back pain, gait problem, myalgias and neck stiffness.  Skin: Negative for color change and pallor.  Neurological: Positive  for weakness. Negative for dizziness, tremors, seizures, syncope, speech difficulty, light-headedness and numbness.  Hematological: Negative for adenopathy. Does not bruise/bleed easily.  Psychiatric/Behavioral: Positive for sleep disturbance. Negative for confusion and agitation. The patient is nervous/anxious.        Objective:   Physical Exam  Filed Vitals:   05/30/13 1525  BP: 140/82  Pulse: 77  Temp: 98.3 F (36.8 C)  TempSrc: Oral  Height: 5' 3.5" (1.613 m)  Weight: 139 lb (63.05 kg)  SpO2: 93%    Gen: Pleasant, well-nourished, in no distress,  normal affect  ENT: No lesions,  mouth clear,  oropharynx clear, no postnasal drip  Neck: No JVD, no TMG, no carotid bruits  Lungs: No use of accessory muscles, no dullness to percussion, distant breath sounds  Cardiovascular: RRR, heart sounds normal, no murmur or gallops, no peripheral edema  Abdomen: soft and NT, no HSM,  BS normal  Musculoskeletal: No deformities, no cyanosis or clubbing  Neuro: alert, non focal  Skin: Warm, no lesions or rashes  No results found.     Assessment & Plan:   COPD gold stage C. Stable copd gold c  Plan No change in medications Flu vaccine    Updated Medication List Outpatient Encounter Prescriptions as of 05/30/2013  Medication Sig Dispense Refill  . albuterol (PROVENTIL HFA;VENTOLIN HFA) 108 (90 BASE) MCG/ACT inhaler Inhale 2 puffs into the lungs every 6 (six) hours as needed for wheezing or shortness of breath.  1 Inhaler  6  . aspirin 81 MG tablet Take 81 mg by mouth daily.        . budesonide-formoterol (SYMBICORT) 160-4.5 MCG/ACT inhaler Inhale 2 puffs into the lungs 2 (two) times daily.  1 Inhaler  5  . cholecalciferol (VITAMIN D) 400 UNITS TABS tablet Take 400 Units by mouth daily as needed.      . clorazepate (TRANXENE) 3.75 MG tablet Take 1 tablet (3.75 mg total) by mouth at bedtime as needed for sleep.  30 tablet  2  . Cyanocobalamin (VITAMIN B-12 IJ) Inject as  directed as needed.      . vitamin E 400 UNIT capsule Take 400 Units by mouth daily.       No facility-administered encounter medications on file as of 05/30/2013.

## 2013-05-31 NOTE — Assessment & Plan Note (Signed)
Stable copd gold c  Plan No change in medications Flu vaccine

## 2013-06-14 DIAGNOSIS — H02829 Cysts of unspecified eye, unspecified eyelid: Secondary | ICD-10-CM | POA: Diagnosis not present

## 2013-06-28 DIAGNOSIS — H02829 Cysts of unspecified eye, unspecified eyelid: Secondary | ICD-10-CM | POA: Diagnosis not present

## 2013-07-26 ENCOUNTER — Encounter (INDEPENDENT_AMBULATORY_CARE_PROVIDER_SITE_OTHER): Payer: Self-pay

## 2013-07-26 ENCOUNTER — Encounter: Payer: Self-pay | Admitting: Family Medicine

## 2013-07-26 ENCOUNTER — Ambulatory Visit (INDEPENDENT_AMBULATORY_CARE_PROVIDER_SITE_OTHER): Payer: Medicare Other | Admitting: Family Medicine

## 2013-07-26 VITALS — BP 169/84 | HR 84 | Ht 64.0 in | Wt 136.4 lb

## 2013-07-26 DIAGNOSIS — M79602 Pain in left arm: Secondary | ICD-10-CM

## 2013-07-26 DIAGNOSIS — S43499A Other sprain of unspecified shoulder joint, initial encounter: Secondary | ICD-10-CM | POA: Diagnosis not present

## 2013-07-26 DIAGNOSIS — M79609 Pain in unspecified limb: Secondary | ICD-10-CM | POA: Diagnosis not present

## 2013-07-26 DIAGNOSIS — M5412 Radiculopathy, cervical region: Secondary | ICD-10-CM

## 2013-07-26 MED ORDER — IBUPROFEN 800 MG PO TABS: 800.0000 mg | ORAL_TABLET | Freq: Three times a day (TID) | ORAL | Status: DC | PRN

## 2013-07-26 MED ORDER — NORTRIPTYLINE HCL 25 MG PO CAPS: 25.0000 mg | ORAL_CAPSULE | Freq: Every day | ORAL | Status: DC

## 2013-07-26 NOTE — Patient Instructions (Signed)
Restart physical therapy with the cervical radiculopathy (pinched nerve) protocol Ibuprofen 800mg  three times a day with food for next 2 weeks then as needed. Nortriptyline 25mg  at bedtime for nerve pain. Hydrocodone as needed for severe pain. Follow up with me in 2 weeks if not improving.  Otherwise follow up in 6 weeks.

## 2013-07-28 ENCOUNTER — Encounter: Payer: Self-pay | Admitting: Family Medicine

## 2013-07-28 NOTE — Assessment & Plan Note (Signed)
Cervical radiculopathy - same symptoms 2 years ago and did well with PT, ibuprofen, nortriptyline, norco so will repeat all of these.  F/u in 2 weeks if not improving.  Otherwise f/u in 6 weeks.

## 2013-07-28 NOTE — Progress Notes (Signed)
Patient ID: Mary Jordan, female   DOB: 1935-06-14, 78 y.o.   MRN: [id number]  PCP: Willow Ora, MD  Subjective:   HPI: Patient is a 78 y.o. female here for left arm pain.  Patient was seen for similar issue back in 2012. Had posterior left shoulder blade pain down her arm - has exact same symptoms. She improved with combination of PT, nortriptyline, norco, ibuprofen. Not taking these now. Has night pain. Numbness into all fingers. No new injuries. No bowel/bladder dysfunction.  Past Medical History  Diagnosis Date  . Lung mass     s/p excision of RUL benign 2007  . COPD (chronic obstructive pulmonary disease)   . Osteoporosis   . Osteoarthritis   . Hyperlipidemia   . Anemia   . B12 deficiency   . Menopause   . Insomnia   . Allergic rhinitis     Current Outpatient Prescriptions on File Prior to Visit  Medication Sig Dispense Refill  . albuterol (PROVENTIL HFA;VENTOLIN HFA) 108 (90 BASE) MCG/ACT inhaler Inhale 2 puffs into the lungs every 6 (six) hours as needed for wheezing or shortness of breath.  1 Inhaler  6  . aspirin 81 MG tablet Take 81 mg by mouth daily.        . budesonide-formoterol (SYMBICORT) 160-4.5 MCG/ACT inhaler Inhale 2 puffs into the lungs 2 (two) times daily.  1 Inhaler  5  . cholecalciferol (VITAMIN D) 400 UNITS TABS tablet Take 400 Units by mouth daily as needed.      . clorazepate (TRANXENE) 3.75 MG tablet Take 1 tablet (3.75 mg total) by mouth at bedtime as needed for sleep.  30 tablet  2  . Cyanocobalamin (VITAMIN B-12 IJ) Inject as directed as needed.      . vitamin E 400 UNIT capsule Take 400 Units by mouth daily.       No current facility-administered medications on file prior to visit.    Past Surgical History  Procedure Laterality Date  . Abdominal hysterectomy    . Breast biopsy      remotely, neg  . Lesion from lung excised, benign  05-2006  . Cardiovascular stress test  2007    Cardiolite negative  . Cataract extraction, bilateral       Allergies  Allergen Reactions  . Levofloxacin     REACTION: hives on chest    History   Social History  . Marital Status: Divorced    Spouse Name: N/A    Number of Children: 2  . Years of Education: N/A   Occupational History  . Retired     Diplomatic Services operational officer   Social History Main Topics  . Smoking status: Former Smoker -- 1.00 packs/day for 39 years    Types: Cigarettes    Quit date: 08/11/1996  . Smokeless tobacco: Never Used     Comment: Using nicorette gum  . Alcohol Use: 3.6 oz/week    3 Cans of beer, 3 Glasses of wine per week     Comment: three times weekly  . Drug Use: No  . Sexual Activity: Not on file   Other Topics Concern  . Not on file   Social History Narrative   Lives by herself ---   Has 2 children, 4 gkids all boys  ---   Totally independent on her ADL             Family History  Problem Relation Age of Onset  . Heart attack Father 71  . Hyperlipidemia Neg Hx   .  Dementia Mother   . Breast cancer Mother   . Asthma Mother   . Diabetes Neg Hx   . Stroke Neg Hx   . Colon cancer Neg Hx     BP 169/84  Pulse 84  Ht 5\' 4"  (1.626 m)  Wt 136 lb 6.4 oz (61.871 kg)  BMI 23.40 kg/m2  Review of Systems: See HPI above.    Objective:  Physical Exam:  Gen: NAD  Neck: No gross deformity, swelling, bruising. TTP mildly left paraspinal region and medial to scapula.  No midline/bony TTP. FROM neck without pain. BUE strength 5/5.   Sensation diminished radial hand, all digits. 2+ equal reflexes in triceps, biceps, brachioradialis tendons. Negative spurlings.    Assessment & Plan:  1. Cervical radiculopathy - same symptoms 2 years ago and did well with PT, ibuprofen, nortriptyline, norco so will repeat all of these.  F/u in 2 weeks if not improving.  Otherwise f/u in 6 weeks.

## 2013-07-29 ENCOUNTER — Ambulatory Visit: Payer: Medicare Other | Attending: Family Medicine | Admitting: Physical Therapy

## 2013-07-29 ENCOUNTER — Telehealth: Payer: Self-pay | Admitting: Critical Care Medicine

## 2013-07-29 DIAGNOSIS — IMO0001 Reserved for inherently not codable concepts without codable children: Secondary | ICD-10-CM | POA: Insufficient documentation

## 2013-07-29 DIAGNOSIS — J449 Chronic obstructive pulmonary disease, unspecified: Secondary | ICD-10-CM | POA: Diagnosis not present

## 2013-07-29 DIAGNOSIS — M25619 Stiffness of unspecified shoulder, not elsewhere classified: Secondary | ICD-10-CM | POA: Insufficient documentation

## 2013-07-29 DIAGNOSIS — M810 Age-related osteoporosis without current pathological fracture: Secondary | ICD-10-CM | POA: Diagnosis not present

## 2013-07-29 DIAGNOSIS — M6281 Muscle weakness (generalized): Secondary | ICD-10-CM | POA: Diagnosis not present

## 2013-07-29 DIAGNOSIS — M1990 Unspecified osteoarthritis, unspecified site: Secondary | ICD-10-CM | POA: Insufficient documentation

## 2013-07-29 DIAGNOSIS — J4489 Other specified chronic obstructive pulmonary disease: Secondary | ICD-10-CM | POA: Insufficient documentation

## 2013-07-29 DIAGNOSIS — M25519 Pain in unspecified shoulder: Secondary | ICD-10-CM | POA: Insufficient documentation

## 2013-07-29 DIAGNOSIS — R293 Abnormal posture: Secondary | ICD-10-CM | POA: Diagnosis not present

## 2013-07-29 NOTE — Telephone Encounter (Signed)
Advised pt that PW and Crystal will be in the [facility] location on Tuesday. Will forward message to Crystal so that she will be aware that the pt will be coming to get samples.

## 2013-08-01 ENCOUNTER — Ambulatory Visit: Payer: Medicare Other | Admitting: Physical Therapy

## 2013-08-01 DIAGNOSIS — R293 Abnormal posture: Secondary | ICD-10-CM | POA: Diagnosis not present

## 2013-08-01 DIAGNOSIS — M6281 Muscle weakness (generalized): Secondary | ICD-10-CM | POA: Diagnosis not present

## 2013-08-01 DIAGNOSIS — M25519 Pain in unspecified shoulder: Secondary | ICD-10-CM | POA: Diagnosis not present

## 2013-08-01 DIAGNOSIS — M25619 Stiffness of unspecified shoulder, not elsewhere classified: Secondary | ICD-10-CM | POA: Diagnosis not present

## 2013-08-01 DIAGNOSIS — IMO0001 Reserved for inherently not codable concepts without codable children: Secondary | ICD-10-CM | POA: Diagnosis not present

## 2013-08-01 DIAGNOSIS — J449 Chronic obstructive pulmonary disease, unspecified: Secondary | ICD-10-CM | POA: Diagnosis not present

## 2013-08-02 ENCOUNTER — Ambulatory Visit: Payer: Medicare Other | Admitting: Physical Therapy

## 2013-08-02 DIAGNOSIS — IMO0001 Reserved for inherently not codable concepts without codable children: Secondary | ICD-10-CM | POA: Diagnosis not present

## 2013-08-02 DIAGNOSIS — M25619 Stiffness of unspecified shoulder, not elsewhere classified: Secondary | ICD-10-CM | POA: Diagnosis not present

## 2013-08-02 DIAGNOSIS — R293 Abnormal posture: Secondary | ICD-10-CM | POA: Diagnosis not present

## 2013-08-02 DIAGNOSIS — M25519 Pain in unspecified shoulder: Secondary | ICD-10-CM | POA: Diagnosis not present

## 2013-08-02 DIAGNOSIS — J449 Chronic obstructive pulmonary disease, unspecified: Secondary | ICD-10-CM | POA: Diagnosis not present

## 2013-08-02 DIAGNOSIS — M6281 Muscle weakness (generalized): Secondary | ICD-10-CM | POA: Diagnosis not present

## 2013-08-02 MED ORDER — BUDESONIDE-FORMOTEROL FUMARATE 160-4.5 MCG/ACT IN AERO: 2.0000 | INHALATION_SPRAY | Freq: Two times a day (BID) | RESPIRATORY_TRACT | Status: DC

## 2013-08-02 NOTE — Telephone Encounter (Signed)
Sampled giving to crystal to take to [facility]. Pt is aware. Nothing further needed

## 2013-08-08 ENCOUNTER — Ambulatory Visit: Payer: Medicare Other | Admitting: Physical Therapy

## 2013-08-08 DIAGNOSIS — IMO0001 Reserved for inherently not codable concepts without codable children: Secondary | ICD-10-CM | POA: Diagnosis not present

## 2013-08-08 DIAGNOSIS — M25619 Stiffness of unspecified shoulder, not elsewhere classified: Secondary | ICD-10-CM | POA: Diagnosis not present

## 2013-08-08 DIAGNOSIS — M6281 Muscle weakness (generalized): Secondary | ICD-10-CM | POA: Diagnosis not present

## 2013-08-08 DIAGNOSIS — J449 Chronic obstructive pulmonary disease, unspecified: Secondary | ICD-10-CM | POA: Diagnosis not present

## 2013-08-08 DIAGNOSIS — M25519 Pain in unspecified shoulder: Secondary | ICD-10-CM | POA: Diagnosis not present

## 2013-08-08 DIAGNOSIS — R293 Abnormal posture: Secondary | ICD-10-CM | POA: Diagnosis not present

## 2013-08-09 ENCOUNTER — Ambulatory Visit: Payer: Medicare Other | Admitting: Physical Therapy

## 2013-08-09 DIAGNOSIS — M6281 Muscle weakness (generalized): Secondary | ICD-10-CM | POA: Diagnosis not present

## 2013-08-09 DIAGNOSIS — M25519 Pain in unspecified shoulder: Secondary | ICD-10-CM | POA: Diagnosis not present

## 2013-08-09 DIAGNOSIS — M25619 Stiffness of unspecified shoulder, not elsewhere classified: Secondary | ICD-10-CM | POA: Diagnosis not present

## 2013-08-09 DIAGNOSIS — J449 Chronic obstructive pulmonary disease, unspecified: Secondary | ICD-10-CM | POA: Diagnosis not present

## 2013-08-09 DIAGNOSIS — IMO0001 Reserved for inherently not codable concepts without codable children: Secondary | ICD-10-CM | POA: Diagnosis not present

## 2013-08-09 DIAGNOSIS — R293 Abnormal posture: Secondary | ICD-10-CM | POA: Diagnosis not present

## 2013-08-15 ENCOUNTER — Ambulatory Visit: Payer: Medicare Other | Attending: Family Medicine | Admitting: Rehabilitation

## 2013-08-15 DIAGNOSIS — IMO0001 Reserved for inherently not codable concepts without codable children: Secondary | ICD-10-CM | POA: Diagnosis not present

## 2013-08-15 DIAGNOSIS — R293 Abnormal posture: Secondary | ICD-10-CM | POA: Insufficient documentation

## 2013-08-15 DIAGNOSIS — M25619 Stiffness of unspecified shoulder, not elsewhere classified: Secondary | ICD-10-CM | POA: Insufficient documentation

## 2013-08-15 DIAGNOSIS — M25519 Pain in unspecified shoulder: Secondary | ICD-10-CM | POA: Diagnosis not present

## 2013-08-15 DIAGNOSIS — M6281 Muscle weakness (generalized): Secondary | ICD-10-CM | POA: Insufficient documentation

## 2013-08-18 ENCOUNTER — Ambulatory Visit: Payer: Medicare Other | Admitting: Rehabilitation

## 2013-08-22 ENCOUNTER — Ambulatory Visit: Payer: Medicare Other | Admitting: Physical Therapy

## 2013-08-25 ENCOUNTER — Ambulatory Visit: Payer: Medicare Other | Admitting: Rehabilitation

## 2013-08-29 ENCOUNTER — Ambulatory Visit: Payer: Medicare Other | Admitting: Physical Therapy

## 2013-08-31 ENCOUNTER — Encounter: Payer: Self-pay | Admitting: Family Medicine

## 2013-08-31 ENCOUNTER — Ambulatory Visit (INDEPENDENT_AMBULATORY_CARE_PROVIDER_SITE_OTHER): Payer: Medicare Other | Admitting: Family Medicine

## 2013-08-31 VITALS — BP 155/93 | HR 81 | Ht 64.0 in | Wt 138.2 lb

## 2013-08-31 DIAGNOSIS — M5412 Radiculopathy, cervical region: Secondary | ICD-10-CM | POA: Diagnosis not present

## 2013-08-31 DIAGNOSIS — M79609 Pain in unspecified limb: Secondary | ICD-10-CM | POA: Diagnosis not present

## 2013-08-31 DIAGNOSIS — S43499A Other sprain of unspecified shoulder joint, initial encounter: Secondary | ICD-10-CM | POA: Diagnosis not present

## 2013-08-31 NOTE — Patient Instructions (Signed)
Restart your baby aspirin.  Finish physical therapy for one more week. Do home exercises for the next 6 weeks about 3 times a week. Follow up as needed.

## 2013-09-01 ENCOUNTER — Ambulatory Visit: Payer: Medicare Other | Admitting: Rehabilitation

## 2013-09-01 ENCOUNTER — Encounter: Payer: Self-pay | Admitting: Family Medicine

## 2013-09-01 NOTE — Progress Notes (Signed)
Patient ID: Mary Jordan, female   DOB: [birth year], 79 y.o.   MRN: [id number]  PCP: Kathlene November, MD  Subjective:   HPI: Patient is a 79 y.o. female here for left arm pain.  12/16: Patient was seen for similar issue back in 2012. Had posterior left shoulder blade pain down her arm - has exact same symptoms. She improved with combination of PT, nortriptyline, norco, ibuprofen. Not taking these now. Has night pain. Numbness into all fingers. No new injuries. No bowel/bladder dysfunction.  08/31/13: Patient reports left shoulder feels better. No longer taking nortriptyline or norco. Last ibuprofen was on Saturday. Pain currently a 0/10. Her middle and ring fingers on left hand feel funny but otherwise improved. No bowel/bladder dysfunction.  Past Medical History  Diagnosis Date  . Lung mass     s/p excision of RUL benign 2007  . COPD (chronic obstructive pulmonary disease)   . Osteoporosis   . Osteoarthritis   . Hyperlipidemia   . Anemia   . B12 deficiency   . Menopause   . Insomnia   . Allergic rhinitis     Current Outpatient Prescriptions on File Prior to Visit  Medication Sig Dispense Refill  . albuterol (PROVENTIL HFA;VENTOLIN HFA) 108 (90 BASE) MCG/ACT inhaler Inhale 2 puffs into the lungs every 6 (six) hours as needed for wheezing or shortness of breath.  1 Inhaler  6  . amoxicillin (AMOXIL) 500 MG capsule       . aspirin 81 MG tablet Take 81 mg by mouth daily.        . budesonide-formoterol (SYMBICORT) 160-4.5 MCG/ACT inhaler Inhale 2 puffs into the lungs 2 (two) times daily.  2 Inhaler  0  . cholecalciferol (VITAMIN D) 400 UNITS TABS tablet Take 400 Units by mouth daily as needed.      . clorazepate (TRANXENE) 3.75 MG tablet Take 1 tablet (3.75 mg total) by mouth at bedtime as needed for sleep.  30 tablet  2  . Cyanocobalamin (VITAMIN B-12 IJ) Inject as directed as needed.      Marland Kitchen ibuprofen (ADVIL,MOTRIN) 800 MG tablet Take 1 tablet (800 mg total) by mouth every 8  (eight) hours as needed.  90 tablet  0  . nortriptyline (PAMELOR) 25 MG capsule Take 1 capsule (25 mg total) by mouth at bedtime.      . vitamin E 400 UNIT capsule Take 400 Units by mouth daily.       No current facility-administered medications on file prior to visit.    Past Surgical History  Procedure Laterality Date  . Abdominal hysterectomy    . Breast biopsy      remotely, neg  . Lesion from lung excised, benign  05-2006  . Cardiovascular stress test  2007    Cardiolite negative  . Cataract extraction, bilateral      Allergies  Allergen Reactions  . Levofloxacin     REACTION: hives on chest    History   Social History  . Marital Status: Divorced    Spouse Name: N/A    Number of Children: 2  . Years of Education: N/A   Occupational History  . Retired     Network engineer   Social History Main Topics  . Smoking status: Former Smoker -- 1.00 packs/day for 39 years    Types: Cigarettes    Quit date: 08/11/1996  . Smokeless tobacco: Never Used     Comment: Using nicorette gum  . Alcohol Use: 3.6 oz/week    3  Cans of beer, 3 Glasses of wine per week     Comment: three times weekly  . Drug Use: No  . Sexual Activity: Not on file   Other Topics Concern  . Not on file   Social History Narrative   Lives by herself ---   Has 2 children, 4 gkids all boys  ---   Totally independent on her ADL             Family History  Problem Relation Age of Onset  . Heart attack Father 59  . Hyperlipidemia Neg Hx   . Dementia Mother   . Breast cancer Mother   . Asthma Mother   . Diabetes Neg Hx   . Stroke Neg Hx   . Colon cancer Neg Hx     BP 155/93  Pulse 81  Ht 5\' 4"  (1.626 m)  Wt 138 lb 3.2 oz (62.687 kg)  BMI 23.71 kg/m2  Review of Systems: See HPI above.    Objective:  Physical Exam:  Gen: NAD  Neck: No gross deformity, swelling, bruising. No TTP left paraspinal region and medial to scapula.  No midline/bony TTP. FROM neck without pain. BUE strength  5/5.   Sensation intact. 2+ equal reflexes in triceps, biceps, brachioradialis tendons. Negative spurlings.    Assessment & Plan:  1. Cervical radiculopathy - Improved with PT, nortriptyline, norco, ibuprofen.  Finish PT then do HEP for next 6 weeks at least.  Ibuprofen as needed - stop other medicines.  F/u prn.

## 2013-09-01 NOTE — Assessment & Plan Note (Signed)
Cervical radiculopathy - Improved with PT, nortriptyline, norco, ibuprofen.  Finish PT then do HEP for next 6 weeks at least.  Ibuprofen as needed - stop other medicines.  F/u prn.

## 2013-09-05 ENCOUNTER — Ambulatory Visit: Payer: Medicare Other | Admitting: Physical Therapy

## 2013-09-08 ENCOUNTER — Ambulatory Visit: Payer: Medicare Other | Admitting: Physical Therapy

## 2013-09-21 DIAGNOSIS — H26499 Other secondary cataract, unspecified eye: Secondary | ICD-10-CM | POA: Diagnosis not present

## 2013-11-15 ENCOUNTER — Telehealth: Payer: Self-pay

## 2013-11-15 NOTE — Telephone Encounter (Signed)
Medication and allergies:  Reviewed and updated  90 day supply/mail order: na Local pharmacy: [facility] [place]   Immunizations due:  UTD  A/P:   No changes to FH, PSH or Personal Hx Flu vaccine--05/2013 Tdap--8/200/ PNA--04/2012 Shingles--10/2007 Pap--hysterectomy MMG--10/2012---neg bi rads Bone Density--08/2012  T score  -2.2 at femoral neck CCS--06/2012--Dr Brodie--adenomatous polyp--next 2018  To Discuss with Provider: Not at this time

## 2013-11-16 ENCOUNTER — Other Ambulatory Visit: Payer: Self-pay | Admitting: Internal Medicine

## 2013-11-16 ENCOUNTER — Encounter: Payer: Self-pay | Admitting: Internal Medicine

## 2013-11-16 ENCOUNTER — Ambulatory Visit (INDEPENDENT_AMBULATORY_CARE_PROVIDER_SITE_OTHER): Payer: Medicare Other | Admitting: Internal Medicine

## 2013-11-16 VITALS — BP 152/84 | HR 76 | Temp 97.9°F | Ht 63.6 in | Wt 139.0 lb

## 2013-11-16 DIAGNOSIS — M810 Age-related osteoporosis without current pathological fracture: Secondary | ICD-10-CM | POA: Diagnosis not present

## 2013-11-16 DIAGNOSIS — IMO0001 Reserved for inherently not codable concepts without codable children: Secondary | ICD-10-CM

## 2013-11-16 DIAGNOSIS — R030 Elevated blood-pressure reading, without diagnosis of hypertension: Secondary | ICD-10-CM

## 2013-11-16 DIAGNOSIS — G4700 Insomnia, unspecified: Secondary | ICD-10-CM | POA: Diagnosis not present

## 2013-11-16 DIAGNOSIS — E785 Hyperlipidemia, unspecified: Secondary | ICD-10-CM

## 2013-11-16 DIAGNOSIS — D518 Other vitamin B12 deficiency anemias: Secondary | ICD-10-CM

## 2013-11-16 DIAGNOSIS — Z1231 Encounter for screening mammogram for malignant neoplasm of breast: Secondary | ICD-10-CM

## 2013-11-16 DIAGNOSIS — Z0000 Encounter for general adult medical examination without abnormal findings: Secondary | ICD-10-CM | POA: Diagnosis not present

## 2013-11-16 DIAGNOSIS — E538 Deficiency of other specified B group vitamins: Secondary | ICD-10-CM

## 2013-11-16 DIAGNOSIS — I10 Essential (primary) hypertension: Secondary | ICD-10-CM | POA: Insufficient documentation

## 2013-11-16 LAB — COMPREHENSIVE METABOLIC PANEL
ALT: 20 U/L (ref 0–35)
AST: 22 U/L (ref 0–37)
Albumin: 4.2 g/dL (ref 3.5–5.2)
Alkaline Phosphatase: 59 U/L (ref 39–117)
BUN: 9 mg/dL (ref 6–23)
CALCIUM: 9 mg/dL (ref 8.4–10.5)
CHLORIDE: 104 meq/L (ref 96–112)
CO2: 25 mEq/L (ref 19–32)
Creatinine, Ser: 0.5 mg/dL (ref 0.4–1.2)
GFR: 119.02 mL/min (ref >60.00)
GLUCOSE: 65 mg/dL — AB (ref 70–99)
Potassium: 3.2 mEq/L — ABNORMAL LOW (ref 3.5–5.1)
Sodium: 137 mEq/L (ref 135–145)
Total Bilirubin: 0.6 mg/dL (ref 0.3–1.2)
Total Protein: 7.5 g/dL (ref 6.0–8.3)

## 2013-11-16 LAB — LIPID PANEL
CHOLESTEROL: 267 mg/dL — AB (ref 0–200)
HDL: 70.6 mg/dL (ref >39.00)
LDL Cholesterol: 175 mg/dL — ABNORMAL HIGH (ref 0–99)
TRIGLYCERIDES: 106 mg/dL (ref 0.0–149.0)
Total CHOL/HDL Ratio: 4
VLDL: 21.2 mg/dL (ref 0.0–40.0)

## 2013-11-16 NOTE — Progress Notes (Signed)
Subjective:    Patient ID: Mary Jordan, female    DOB: 1935-05-27, 79 y.o.   MRN: [id number]  DOS:  11/16/2013 Type of  visit:   Here for Medicare AWV:  1. Risk factors based on Past M, S, F history: reviewed  2. Physical Activities: does home chores, yard works, [employer] twice a week  3. Depression/mood: (-) screening  4. Hearing: No problems noted or reported  5. ADL's: independent, still drives  6. Fall Risk: no recent problems, average risk  7. home Safety: does feel safe at home  8. Height, weight, &visual acuity: see VS, s/p cataract surgery B  9. Counseling: provided  10. Labs ordered based on risk factors: if needed  11. Referral Coordination: if needed  12. Care Plan, see assessment and plan  13. Cognitive Assessment: Motor skill and cognition within normal   In addition, today we discussed the following: Osteoporosis, did not get  her last reclast, not taking calcium and vitamin D B12 deficiency, last shot was 07/2013 Elevated BP, chart reviewed, BP also slightly elevated at other offices High cholesterol, on no medications, due for labs. COPD, per pulmonary.   ROS No  CP . No palpitations, no lower extremity edema Denies  nausea, vomiting diarrhea Denies  blood in the stools Rest sx at baseline  (-)hemoptysis No dysuria, gross hematuria, difficulty urinating  No vaginal discharge, genital rash, dyspareunia.    Past Medical History  Diagnosis Date  . Lung mass     s/p excision of RUL benign 2007  . COPD (chronic obstructive pulmonary disease)   . Osteoporosis   . Osteoarthritis   . Hyperlipidemia   . Anemia   . B12 deficiency   . Menopause   . Insomnia   . Allergic rhinitis     Past Surgical History  Procedure Laterality Date  . Abdominal hysterectomy    . Breast biopsy      remotely, neg  . Lesion from lung excised, benign  05-2006  . Cardiovascular stress test  2007    Cardiolite negative  . Cataract extraction, bilateral      History    Social History  . Marital Status: Divorced    Spouse Name: N/A    Number of Children: 2  . Years of Education: N/A   Occupational History  . Retired     Network engineer   Social History Main Topics  . Smoking status: Former Smoker -- 1.00 packs/day for 39 years    Types: Cigarettes    Quit date: 08/11/1996  . Smokeless tobacco: Never Used     Comment:    . Alcohol Use: 3.6 oz/week    3 Cans of beer, 3 Glasses of wine per week     Comment: three times weekly  . Drug Use: No  . Sexual Activity: Not on file   Other Topics Concern  . Not on file   Social History Narrative   Lives by herself    Has 2 children, 4 gkids all boys             Family History  Problem Relation Age of Onset  . Heart attack Father 72  . Hyperlipidemia Neg Hx   . Dementia Mother   . Breast cancer Mother   . Asthma Mother   . Diabetes Neg Hx   . Stroke Neg Hx   . Colon cancer Neg Hx        Medication List       This  list is accurate as of: 11/16/13  5:31 PM.  Always use your most recent med list.               albuterol 108 (90 BASE) MCG/ACT inhaler  Commonly known as:  PROVENTIL HFA;VENTOLIN HFA  Inhale 2 puffs into the lungs every 6 (six) hours as needed for wheezing or shortness of breath.     aspirin 81 MG tablet  Take 81 mg by mouth daily.     budesonide-formoterol 160-4.5 MCG/ACT inhaler  Commonly known as:  SYMBICORT  Inhale 2 puffs into the lungs 2 (two) times daily.     clorazepate 3.75 MG tablet  Commonly known as:  TRANXENE  Take 1 tablet (3.75 mg total) by mouth at bedtime as needed for sleep.           Objective:   Physical Exam BP 152/84  Pulse 76  Temp(Src) 97.9 F (36.6 C)  Ht 5' 3.6" (1.615 m)  Wt 139 lb (63.05 kg)  BMI 24.17 kg/m2  SpO2 95%  General -- alert, well-developed, NAD.  Neck --no thyromegaly , normal carotid pulse  HEENT-- Not pale.   Breast-- no dominant mass, skin and nipples normal to inspection on palpation, axillary areas without  mass or lymphadenopathy Lungs -- normal respiratory effort, no intercostal retractions, no accessory muscle use, and normal breath sounds.  Heart-- normal rate, regular rhythm, no murmur.  Abdomen-- Not distended, good bowel sounds,soft, non-tender. Extremities-- no pretibial edema bilaterally  Neurologic--  alert & oriented X3. Speech normal, gait normal, strength normal in all extremities.  Psych-- Cognition and judgment appear intact. Cooperative with normal attention span and concentration. No anxious or depressed appearing.       Assessment & Plan:

## 2013-11-16 NOTE — Assessment & Plan Note (Signed)
On traxene, UDS

## 2013-11-16 NOTE — Assessment & Plan Note (Addendum)
Check labs. Not taking shots monthly, recommend good compliance

## 2013-11-16 NOTE — Patient Instructions (Signed)
Get your blood work before you leave  Also needs a B12 and UDS  Consider take a PREVNAR  Check the  blood pressure   Weekly  be sure it is between 110/60 and 140/85. Ideal blood pressure is 120/80. If it is consistently higher or lower, let me know  Take calcium 1 gram and vitamin D 600 units to 1000 units every day    Next visit is for routine check up regards your   blood pressure  in 6 moinths  No need to come back fasting Please make an appointment       Fall Prevention and [place] cause injuries and can affect all age groups. It is possible to use preventive measures to significantly decrease the likelihood of falls. There are many simple measures which can make your home safer and prevent falls. OUTDOORS  Repair cracks and edges of walkways and driveways.  Remove high doorway thresholds.  Trim shrubbery on the main path into your home.  Have good outside lighting.  Clear walkways of tools, rocks, debris, and clutter.  Check that handrails are not broken and are securely fastened. Both sides of steps should have handrails.  Have leaves, snow, and ice cleared regularly.  Use sand or salt on walkways during winter months.  In the garage, clean up grease or oil spills. BATHROOM  Install night lights.  Install grab bars by the toilet and in the tub and shower.  Use non-skid mats or decals in the tub or shower.  Place a plastic non-slip stool in the shower to sit on, if needed.  Keep floors dry and clean up all water on the floor immediately.  Remove soap buildup in the tub or shower on a regular basis.  Secure bath mats with non-slip, double-sided rug tape.  Remove throw rugs and tripping hazards from the floors. BEDROOMS  Install night lights.  Make sure a bedside light is easy to reach.  Do not use oversized bedding.  Keep a telephone by your bedside.  Have a firm chair with side arms to use for getting dressed.  Remove throw rugs and  tripping hazards from the floor. KITCHEN  Keep handles on pots and pans turned toward the center of the stove. Use back burners when possible.  Clean up spills quickly and allow time for drying.  Avoid walking on wet floors.  Avoid hot utensils and knives.  Position shelves so they are not too high or low.  Place commonly used objects within easy reach.  If necessary, use a sturdy step stool with a grab bar when reaching.  Keep electrical cables out of the way.  Do not use floor polish or wax that makes floors slippery. If you must use wax, use non-skid floor wax.  Remove throw rugs and tripping hazards from the floor. STAIRWAYS  Never leave objects on stairs.  Place handrails on both sides of stairways and use them. Fix any loose handrails. Make sure handrails on both sides of the stairways are as long as the stairs.  Check carpeting to make sure it is firmly attached along stairs. Make repairs to worn or loose carpet promptly.  Avoid placing throw rugs at the top or bottom of stairways, or properly secure the rug with carpet tape to prevent slippage. Get rid of throw rugs, if possible.  Have an electrician put in a light switch at the top and bottom of the stairs. OTHER FALL PREVENTION TIPS  Wear low-heel or rubber-soled shoes that  are supportive and fit well. Wear closed toe shoes.  When using a stepladder, make sure it is fully opened and both spreaders are firmly locked. Do not climb a closed stepladder.  Add color or contrast paint or tape to grab bars and handrails in your home. Place contrasting color strips on first and last steps.  Learn and use mobility aids as needed. Install an electrical emergency response system.  Turn on lights to avoid dark areas. Replace light bulbs that burn out immediately. Get light switches that glow.  Arrange furniture to create clear pathways. Keep furniture in the same place.  Firmly attach carpet with non-skid or double-sided  tape.  Eliminate uneven floor surfaces.  Select a carpet pattern that does not visually hide the edge of steps.  Be aware of all pets. OTHER HOME SAFETY TIPS  Set the water temperature for 120 F (48.8 C).  Keep emergency numbers on or near the telephone.  Keep smoke detectors on every level of the home and near sleeping areas. Document Released: 07/18/2002 Document Revised: 01/27/2012 Document Reviewed: 10/17/2011 [facility] Patient Information 2014 [place].

## 2013-11-16 NOTE — Assessment & Plan Note (Signed)
Diet and exercise discussed, labs

## 2013-11-16 NOTE — Assessment & Plan Note (Addendum)
Td 2008  pneumonia shot 2008, 2013; had the  shingles shot 2009  rec PREVNAR  MMG 3-14  neg  , breast exam today ok H/o hysterectomy, asx , no further screenings . No h/o abnormal PAPs , see previous entry  Cscope 03-2008 Dr Olevia Perches 2 polyps (Hyperplastic and tubular adenoma) , colonoscopy again 11-15- 2013, 1 polyp. Next 2018 Diet, exercise  Discussed

## 2013-11-16 NOTE — Assessment & Plan Note (Signed)
Last bone density test 07/2012, T score -2.2. Did not get her last rclast. Plan: Schedule a reclast Check a CMP Restart calcium and vitamin D.

## 2013-11-16 NOTE — Assessment & Plan Note (Signed)
BP noted to be slightly elevated, recommend self-monitoring. Recheck in 6 months

## 2013-11-17 ENCOUNTER — Telehealth: Payer: Self-pay

## 2013-11-18 NOTE — Telephone Encounter (Signed)
Called medicare for benefit approval and received an error message that the automated system was experiencing technical difficultes. JG//CMA

## 2013-11-21 ENCOUNTER — Telehealth: Payer: Self-pay | Admitting: *Deleted

## 2013-11-21 MED ORDER — SIMVASTATIN 10 MG PO TABS: 10.0000 mg | ORAL_TABLET | Freq: Every day | ORAL | Status: DC

## 2013-11-21 NOTE — Telephone Encounter (Signed)
Pt notified of lab results - Pt states unable to take Pravachol 40 mg due to history of right leg pain when taking medication.

## 2013-11-21 NOTE — Telephone Encounter (Signed)
Recommend low dose of simvastatin which she was able to tolerate before: Simvastatin 10 mg 1 by mouth each bedtime #30 and 4 refills

## 2013-11-21 NOTE — Telephone Encounter (Signed)
Per Medicare patient does not need PA. Orders signed and faxed to [facility]

## 2013-11-22 NOTE — Telephone Encounter (Signed)
Pt verbalized understanding. Also requesting high potassium diet list. Done / sent .

## 2013-11-23 ENCOUNTER — Ambulatory Visit (HOSPITAL_BASED_OUTPATIENT_CLINIC_OR_DEPARTMENT_OTHER)
Admission: RE | Admit: 2013-11-23 | Discharge: 2013-11-23 | Disposition: A | Discharge status: Home / Self Care | Payer: Medicare Other | Source: Ambulatory Visit | Attending: Internal Medicine | Admitting: Internal Medicine

## 2013-11-23 DIAGNOSIS — Z1231 Encounter for screening mammogram for malignant neoplasm of breast: Secondary | ICD-10-CM | POA: Insufficient documentation

## 2013-11-25 DIAGNOSIS — L820 Inflamed seborrheic keratosis: Secondary | ICD-10-CM | POA: Diagnosis not present

## 2013-11-25 DIAGNOSIS — D485 Neoplasm of uncertain behavior of skin: Secondary | ICD-10-CM | POA: Diagnosis not present

## 2013-11-25 DIAGNOSIS — L723 Sebaceous cyst: Secondary | ICD-10-CM | POA: Diagnosis not present

## 2013-11-25 DIAGNOSIS — L570 Actinic keratosis: Secondary | ICD-10-CM | POA: Diagnosis not present

## 2013-12-08 ENCOUNTER — Telehealth: Payer: Self-pay | Admitting: Internal Medicine

## 2013-12-08 ENCOUNTER — Other Ambulatory Visit (HOSPITAL_COMMUNITY): Payer: Self-pay | Admitting: Internal Medicine

## 2013-12-08 ENCOUNTER — Ambulatory Visit (HOSPITAL_COMMUNITY): Admission: RE | Admit: 2013-12-08 | Payer: Medicare Other | Source: Ambulatory Visit

## 2013-12-08 NOTE — Telephone Encounter (Signed)
Patient was not aware that she had an appt scheduled.  Advised that we will reschedule for her. Short Stay closed for the day.

## 2013-12-08 NOTE — Telephone Encounter (Signed)
Caller name:Dee Relation to pt: Call back [place]:  Reason for call: Dee from [facility] called and stated that patient did not show up for her reclast appointment today and her labs will expire on 12/16/2013. Please reschedule patient before May 8 th.

## 2013-12-09 NOTE — Telephone Encounter (Signed)
Appt is rescheduled. May 6th Wednesday at 3pm. Patient notified.

## 2013-12-09 NOTE — Telephone Encounter (Signed)
Spoke with short stay. They will call back with appt.

## 2013-12-14 ENCOUNTER — Encounter (HOSPITAL_COMMUNITY): Payer: Self-pay

## 2013-12-14 ENCOUNTER — Ambulatory Visit (HOSPITAL_COMMUNITY)
Admission: RE | Admit: 2013-12-14 | Discharge: 2013-12-14 | Disposition: A | Discharge status: Home / Self Care | Payer: Medicare Other | Source: Ambulatory Visit | Attending: Internal Medicine | Admitting: Internal Medicine

## 2013-12-14 DIAGNOSIS — M810 Age-related osteoporosis without current pathological fracture: Secondary | ICD-10-CM | POA: Insufficient documentation

## 2013-12-14 MED ORDER — ZOLEDRONIC ACID 5 MG/100ML IV SOLN
5.0000 mg | Freq: Once | INTRAVENOUS | Status: AC
  Administered 2013-12-14: 5 mg via INTRAVENOUS
  Filled 2013-12-14: qty 100

## 2013-12-14 MED ORDER — SODIUM CHLORIDE 0.9 % IV SOLN
Freq: Once | INTRAVENOUS | Status: AC
  Administered 2013-12-14: 15:00:00 via INTRAVENOUS

## 2013-12-14 NOTE — Progress Notes (Signed)
Pt states she occasionally takes Vitamin D and calcium.  Informed pt that if her MD put her on these meds she should take them and it is recommended you take these meds when you are receiving Reclast.  Informed pt vitamin D and calcium help the Reclast work better.  Pt voiced understanding.

## 2013-12-14 NOTE — Discharge Instructions (Signed)

## 2013-12-16 ENCOUNTER — Telehealth: Payer: Self-pay | Admitting: Critical Care Medicine

## 2013-12-16 MED ORDER — BUDESONIDE-FORMOTEROL FUMARATE 160-4.5 MCG/ACT IN AERO: 2.0000 | INHALATION_SPRAY | Freq: Two times a day (BID) | RESPIRATORY_TRACT | Status: DC

## 2013-12-16 NOTE — Telephone Encounter (Signed)
i am ok with this 

## 2013-12-16 NOTE — Telephone Encounter (Signed)
Will send to [place] as FYI that upon Dr Bettina Gavia return, patient needs to be contacted in regards to getting an alternative for her Symbicort.  In the meantime, patient is going to be given samples of Symbicort to use until that time.  Pt to pick up samples from [facility] office on 5.14.15.

## 2013-12-16 NOTE — Telephone Encounter (Signed)
Dr. Joya Gaskins, pt is wanting to know if there is an alternative for Symbicort bc the cost has now increased.  Please advise if there is anything else we can try to send for possible cheaper copays.  Thank you.  Pt will be given symbicort samples in [facility] next wk to hold her over until msg is addressed by PW as he listed off.

## 2013-12-16 NOTE — Telephone Encounter (Signed)
Spoke with pt -- Symbicort is too expensive; insurance has not changed and the OOP cost has increased to $66 per Rx. Pt not sure why there has been a sudden increase. Dr Joya Gaskins is out of office on vacation until 12/26/13--offered to have one of our other physicians in office give options for alternatives but patient refused stating that she wanted to wait for PW to return to address this. Pt states that she will run out of Symbicort in a few days and is requesting a sample to hold her over until Dr Joya Gaskins returns and we are able to get her an alternative.  Patient requesting to pick up samples from [facility] office next week--Dr Elsworth Soho is in [facility] 12/22/13 for the afternoon. Patient would like to pick these up from the [facility] office at that time.   Samples to be given to Nurse traveling to [facility] with Dr Elsworth Soho.   Will send to Dr Joya Gaskins to address upon his return. Thanks.

## 2013-12-17 NOTE — Telephone Encounter (Signed)
Go through pts formulary with her to determine cheaper alternate eg advair or dulera

## 2013-12-19 NOTE — Telephone Encounter (Signed)
Spoke with the pt and advised do get her drug formulary from insurance to see what is covered at a lower tier. Pt states she will get this info and call us back. [place], CMA

## 2013-12-22 ENCOUNTER — Telehealth: Payer: Self-pay | Admitting: Critical Care Medicine

## 2013-12-22 NOTE — Telephone Encounter (Signed)
Pt informed that samples will be at [facility] office after 2:00 today.

## 2013-12-30 DIAGNOSIS — L570 Actinic keratosis: Secondary | ICD-10-CM | POA: Diagnosis not present

## 2014-01-04 ENCOUNTER — Other Ambulatory Visit (INDEPENDENT_AMBULATORY_CARE_PROVIDER_SITE_OTHER): Payer: Medicare Other

## 2014-01-04 DIAGNOSIS — E785 Hyperlipidemia, unspecified: Secondary | ICD-10-CM

## 2014-01-04 NOTE — Addendum Note (Signed)
Addended by: Modena Morrow D on: 01/04/2014 12:14 PM   Modules accepted: Orders

## 2014-01-05 LAB — LIPID PANEL
CHOLESTEROL: 182 mg/dL (ref 0–200)
HDL: 66.1 mg/dL (ref >39.00)
LDL Cholesterol: 102 mg/dL — ABNORMAL HIGH (ref 0–99)
Total CHOL/HDL Ratio: 3
Triglycerides: 68 mg/dL (ref 0.0–149.0)
VLDL: 13.6 mg/dL (ref 0.0–40.0)

## 2014-01-05 LAB — AST: AST: 25 U/L (ref 0–37)

## 2014-01-05 LAB — ALT: ALT: 21 U/L (ref 0–35)

## 2014-01-10 MED ORDER — SIMVASTATIN 10 MG PO TABS: 10.0000 mg | ORAL_TABLET | Freq: Every day | ORAL | Status: DC

## 2014-01-10 NOTE — Addendum Note (Signed)
Addended by: Peggyann Shoals on: 01/10/2014 05:10 PM   Modules accepted: Orders

## 2014-01-30 ENCOUNTER — Telehealth: Payer: Self-pay | Admitting: *Deleted

## 2014-01-30 NOTE — Telephone Encounter (Signed)
Caller name:  Shamari Relation to pt:  self Call back number: [phone]  Pharmacy:  [facility]  Reason for call:   Pt called to request refill on  clorazepate (TRANXENE) 3.75 MG tablet  Last filled 05/30/2013, #30, 2 refills Last OV 11/16/2013  Pt called pharmacy to request refill, they said they faxed refill authorization to our office 01/24/14 and again today (01/30/14) and have not received anything back.  Please advise.  bw

## 2014-01-31 NOTE — Telephone Encounter (Signed)
I prescribed a UDS 4 /2015, please print UDS

## 2014-01-31 NOTE — Telephone Encounter (Signed)
Patient is requesting a refill for Tranxene Last office visit 11/16/13 Last filled 05/30/13 #30 with 2 RF Okay to refill?

## 2014-02-01 MED ORDER — CLORAZEPATE DIPOTASSIUM 3.75 MG PO TABS: 3.7500 mg | ORAL_TABLET | Freq: Every evening | ORAL | Status: DC | PRN

## 2014-02-01 NOTE — Telephone Encounter (Signed)
I searched the [employer] database, a UDS has not been collected.

## 2014-02-01 NOTE — Telephone Encounter (Signed)
rx printed. Needs UDS at time of Rx

## 2014-02-01 NOTE — Telephone Encounter (Signed)
Left message on voice mail that Rx was ready to be picked up

## 2014-02-03 DIAGNOSIS — Z79899 Other long term (current) drug therapy: Secondary | ICD-10-CM | POA: Diagnosis not present

## 2014-02-15 ENCOUNTER — Telehealth: Payer: Self-pay | Admitting: *Deleted

## 2014-02-15 NOTE — Telephone Encounter (Signed)
UDS LOW risk 02/03/14

## 2014-02-24 ENCOUNTER — Encounter: Payer: Self-pay | Admitting: Internal Medicine

## 2014-03-08 ENCOUNTER — Other Ambulatory Visit: Payer: Self-pay | Admitting: *Deleted

## 2014-03-08 MED ORDER — ALBUTEROL SULFATE HFA 108 (90 BASE) MCG/ACT IN AERS: 2.0000 | INHALATION_SPRAY | Freq: Four times a day (QID) | RESPIRATORY_TRACT | Status: DC | PRN

## 2014-04-13 ENCOUNTER — Telehealth: Payer: Self-pay | Admitting: Critical Care Medicine

## 2014-04-13 MED ORDER — BUDESONIDE-FORMOTEROL FUMARATE 160-4.5 MCG/ACT IN AERO: 2.0000 | INHALATION_SPRAY | Freq: Two times a day (BID) | RESPIRATORY_TRACT | Status: DC

## 2014-04-13 NOTE — Telephone Encounter (Signed)
Spoke with the pt  I advised we will refill med, but needs ov as she is overdue for appt  She is scheduled for [facility] office on 04/20/14  Rx refilled x 1 month  Nothing further needed per pt

## 2014-04-14 ENCOUNTER — Other Ambulatory Visit: Payer: Self-pay | Admitting: Emergency Medicine

## 2014-04-20 ENCOUNTER — Ambulatory Visit (INDEPENDENT_AMBULATORY_CARE_PROVIDER_SITE_OTHER): Payer: Medicare Other | Admitting: Critical Care Medicine

## 2014-04-20 ENCOUNTER — Encounter: Payer: Self-pay | Admitting: Critical Care Medicine

## 2014-04-20 VITALS — BP 138/92 | HR 86 | Temp 97.7°F | Ht 64.0 in | Wt 136.0 lb

## 2014-04-20 DIAGNOSIS — J449 Chronic obstructive pulmonary disease, unspecified: Secondary | ICD-10-CM | POA: Diagnosis not present

## 2014-04-20 DIAGNOSIS — Z23 Encounter for immunization: Secondary | ICD-10-CM

## 2014-04-20 NOTE — Patient Instructions (Signed)
Stay on symbicort two puff twice daily, use samples A flu vaccine and prevnar 13 was given Return 6 months

## 2014-04-20 NOTE — Progress Notes (Signed)
Subjective:    Patient ID: Mary Jordan, female    DOB: 12-17-1934, 79 y.o.   MRN: [id number]  HPI 04/20/2014 Chief Complaint  Patient presents with  . COPD    Follow-up. Pt states she has good and bad days. She states the high humidity causes her to have increased SOB at times. She states that SOB is not any worse then usual.   Copd f/u. No abx or pred given .  Cannot afford symbicort     Review of Systems Constitutional:   No  weight loss, night sweats,  Fevers, chills, fatigue, lassitude. HEENT:   No headaches,  Difficulty swallowing,  Tooth/dental problems,  Sore throat,                No sneezing, itching, ear ache, nasal congestion, post nasal drip,   CV:  No chest pain,  Orthopnea, PND, swelling in lower extremities, anasarca, dizziness, palpitations  GI  No heartburn, indigestion, abdominal pain, nausea, vomiting, diarrhea, change in bowel habits, loss of appetite  Resp: Notes shortness of breath with exertion or at rest.  No excess mucus, no productive cough,  No non-productive cough,  No coughing up of blood.  No change in color of mucus.  No wheezing.  No chest wall deformity  Skin: no rash or lesions.  GU: no dysuria, change in color of urine, no urgency or frequency.  No flank pain.  MS:  No joint pain or swelling.  No decreased range of motion.  No back pain.  Psych:  No change in mood or affect. No depression or anxiety.  No memory loss.     Objective:   Physical Exam Filed Vitals:   04/20/14 1052  BP: 138/92  Pulse: 86  Temp: 97.7 F (36.5 C)  TempSrc: Oral  Height: 5\' 4"  (1.626 m)  Weight: 136 lb (61.689 kg)  SpO2: 94%    Gen: Pleasant, well-nourished, in no distress,  normal affect  ENT: No lesions,  mouth clear,  oropharynx clear, no postnasal drip  Neck: No JVD, no TMG, no carotid bruits  Lungs: No use of accessory muscles, no dullness to percussion, distant breath sounds  Cardiovascular: RRR, heart sounds normal, no murmur or  gallops, no peripheral edema  Abdomen: soft and NT, no HSM,  BS normal  Musculoskeletal: No deformities, no cyanosis or clubbing  Neuro: alert, non focal  Skin: Warm, no lesions or rashes.assesspw   No results found.        Assessment & Plan:   COPD gold stage C. Gold stage C. COPD stable at this time Plan Stay on symbicort two puff twice daily, use samples A flu vaccine and prevnar 13 was given Return 6 months    Updated Medication List Outpatient Encounter Prescriptions as of 04/20/2014  Medication Sig  . albuterol (PROVENTIL HFA;VENTOLIN HFA) 108 (90 BASE) MCG/ACT inhaler Inhale 2 puffs into the lungs every 6 (six) hours as needed for wheezing or shortness of breath.  Marland Kitchen aspirin 81 MG tablet Take 81 mg by mouth daily.    . budesonide-formoterol (SYMBICORT) 160-4.5 MCG/ACT inhaler Inhale 2 puffs into the lungs 2 (two) times daily.  . calcium carbonate (OS-CAL - DOSED IN MG OF ELEMENTAL CALCIUM) 1250 MG tablet Take 1 tablet by mouth.  . cholecalciferol (VITAMIN D) 1000 UNITS tablet Take 1,000 Units by mouth daily.  . clorazepate (TRANXENE) 3.75 MG tablet Take 1 tablet (3.75 mg total) by mouth at bedtime as needed for sleep.  . cyanocobalamin (,VITAMIN  B-12,) 1000 MCG/ML injection Inject 1,000 mcg into the muscle every 30 (thirty) days.  . simvastatin (ZOCOR) 10 MG tablet Take 1 tablet (10 mg total) by mouth at bedtime.  . vitamin E 100 UNIT capsule Take 100 Units by mouth daily.

## 2014-04-20 NOTE — Assessment & Plan Note (Signed)
Gold stage C. COPD stable at this time Plan Stay on symbicort two puff twice daily, use samples A flu vaccine and prevnar 13 was given Return 6 months

## 2014-05-19 ENCOUNTER — Ambulatory Visit: Payer: Medicare Other | Admitting: Internal Medicine

## 2014-05-24 ENCOUNTER — Telehealth: Payer: Self-pay

## 2014-05-24 ENCOUNTER — Other Ambulatory Visit: Payer: Self-pay | Admitting: Internal Medicine

## 2014-05-24 ENCOUNTER — Telehealth: Payer: Self-pay | Admitting: Family Medicine

## 2014-05-24 ENCOUNTER — Encounter (INDEPENDENT_AMBULATORY_CARE_PROVIDER_SITE_OTHER): Payer: Self-pay

## 2014-05-24 ENCOUNTER — Ambulatory Visit (HOSPITAL_BASED_OUTPATIENT_CLINIC_OR_DEPARTMENT_OTHER)
Admission: RE | Admit: 2014-05-24 | Discharge: 2014-05-24 | Disposition: A | Discharge status: Home / Self Care | Payer: Medicare Other | Source: Ambulatory Visit | Attending: Family Medicine | Admitting: Family Medicine

## 2014-05-24 ENCOUNTER — Encounter: Payer: Self-pay | Admitting: Family Medicine

## 2014-05-24 ENCOUNTER — Ambulatory Visit (INDEPENDENT_AMBULATORY_CARE_PROVIDER_SITE_OTHER): Payer: Medicare Other | Admitting: Family Medicine

## 2014-05-24 VITALS — BP 151/86 | HR 78 | Ht 64.0 in | Wt 133.0 lb

## 2014-05-24 DIAGNOSIS — M7989 Other specified soft tissue disorders: Secondary | ICD-10-CM | POA: Diagnosis not present

## 2014-05-24 DIAGNOSIS — S8992XA Unspecified injury of left lower leg, initial encounter: Secondary | ICD-10-CM | POA: Diagnosis not present

## 2014-05-24 DIAGNOSIS — M25562 Pain in left knee: Secondary | ICD-10-CM | POA: Diagnosis not present

## 2014-05-24 DIAGNOSIS — M25512 Pain in left shoulder: Secondary | ICD-10-CM | POA: Diagnosis not present

## 2014-05-24 MED ORDER — CLORAZEPATE DIPOTASSIUM 3.75 MG PO TABS: 3.7500 mg | ORAL_TABLET | Freq: Every evening | ORAL | Status: DC | PRN

## 2014-05-24 MED ORDER — IBUPROFEN 800 MG PO TABS: 800.0000 mg | ORAL_TABLET | Freq: Three times a day (TID) | ORAL | Status: DC | PRN

## 2014-05-24 NOTE — Telephone Encounter (Signed)
rx printed, advise pt she is due for her 6 month check up, please arrange

## 2014-05-24 NOTE — Telephone Encounter (Signed)
Pt is requesting refill on Clorazepate  Last OV: 11/16/2013 Last Fill: 02/01/2014 # [phone] RF UDS: 02/03/2014 Low Risk   Please advise.

## 2014-05-24 NOTE — Telephone Encounter (Signed)
Sent it to [facility].  Thanks!

## 2014-05-24 NOTE — Telephone Encounter (Signed)
Faxed to [facility], and letter printed and mailed to Pt informing her of F/U appt needed.

## 2014-05-24 NOTE — Patient Instructions (Signed)
I'm concerned you tore the medial meniscus of your left knee. Ibuprofen 800mg  three times a day with food for pain and inflammation. Icing 15 minutes at a time 3-4 times a day. Start straight leg raises, knee extensions, and hamstring curls 3 sets of 10 once a day. Consider physical therapy or cortisone injection if not improving. Follow up with me in 1 month.  You have rotator cuff impingement Try to avoid painful activities (overhead activities, lifting with extended arm) as much as possible. Subacromial injection may be beneficial to help with pain and to decrease inflammation. Consider physical therapy with transition to home exercise program. Do home exercise program with theraband and scapular stabilization exercises daily. If not improving at follow-up we will consider further imaging, physical therapy, injection and/or nitro patches. Follow up with me in 1 month.

## 2014-05-26 ENCOUNTER — Encounter: Payer: Self-pay | Admitting: Family Medicine

## 2014-05-26 NOTE — Assessment & Plan Note (Signed)
consistent with medial meniscus tear.  Start with conservative treatment - icing, nsaids, home exercises which were reviewed today.  Consider PT, injection if not improving.  F/u in 1 month.

## 2014-05-26 NOTE — Assessment & Plan Note (Signed)
2/2 rotator cuff impingement.  Start home exercises, avoid overhead and reaching motions.  Consider injection, physical therapy, nitro, further imaging if not improving.  F/u in 1 month.

## 2014-05-26 NOTE — Progress Notes (Signed)
Patient ID: Mary Jordan, female   DOB: 01/18/1935, 79 y.o.   MRN: [id number]  PCP: Kathlene November, MD  Subjective:   HPI: Patient is a 79 y.o. female here for left knee injury.  Patient reports on 10/6 her knee gave out on her without any incident. Almost fell down as a result but did not. Associated swelling, warmth. Has been icing, using bengay, ibuprofen 800, heat. No catching, locking.  States she's having problems with left arm again. No longer taking nortriptyline and previously did physical therapy. Pain primarily with movement of left shoulder, overhead motions. Worse lying on left shoulder.  Past Medical History  Diagnosis Date  . Lung mass     s/p excision of RUL benign 2007  . COPD (chronic obstructive pulmonary disease)   . Osteoporosis   . Osteoarthritis   . Hyperlipidemia   . Anemia   . B12 deficiency   . Menopause   . Insomnia   . Allergic rhinitis     Current Outpatient Prescriptions on File Prior to Visit  Medication Sig Dispense Refill  . albuterol (PROVENTIL HFA;VENTOLIN HFA) 108 (90 BASE) MCG/ACT inhaler Inhale 2 puffs into the lungs every 6 (six) hours as needed for wheezing or shortness of breath.  1 Inhaler  3  . aspirin 81 MG tablet Take 81 mg by mouth daily.        . budesonide-formoterol (SYMBICORT) 160-4.5 MCG/ACT inhaler Inhale 2 puffs into the lungs 2 (two) times daily.  1 Inhaler  0  . calcium carbonate (OS-CAL - DOSED IN MG OF ELEMENTAL CALCIUM) 1250 MG tablet Take 1 tablet by mouth.      . cholecalciferol (VITAMIN D) 1000 UNITS tablet Take 1,000 Units by mouth daily.      . cyanocobalamin (,VITAMIN B-12,) 1000 MCG/ML injection Inject 1,000 mcg into the muscle every 30 (thirty) days.      . simvastatin (ZOCOR) 10 MG tablet Take 1 tablet (10 mg total) by mouth at bedtime.  30 tablet  12  . vitamin E 100 UNIT capsule Take 100 Units by mouth daily.       No current facility-administered medications on file prior to visit.    Past Surgical  History  Procedure Laterality Date  . Abdominal hysterectomy    . Breast biopsy      remotely, neg  . Lesion from lung excised, benign  05-2006  . Cardiovascular stress test  2007    Cardiolite negative  . Cataract extraction, bilateral      Allergies  Allergen Reactions  . Levofloxacin     REACTION: hives on chest    History   Social History  . Marital Status: Divorced    Spouse Name: N/A    Number of Children: 2  . Years of Education: N/A   Occupational History  . Retired     Network engineer   Social History Main Topics  . Smoking status: Former Smoker -- 1.00 packs/day for 39 years    Types: Cigarettes    Quit date: 08/11/1996  . Smokeless tobacco: Never Used     Comment:    . Alcohol Use: 3.6 oz/week    3 Cans of beer, 3 Glasses of wine per week     Comment: three times weekly  . Drug Use: No  . Sexual Activity: Not on file   Other Topics Concern  . Not on file   Social History Narrative   Lives by herself    Has 2 children, 4 gkids  all boys            Family History  Problem Relation Age of Onset  . Heart attack Father 67  . Hyperlipidemia Neg Hx   . Dementia Mother   . Breast cancer Mother   . Asthma Mother   . Diabetes Neg Hx   . Stroke Neg Hx   . Colon cancer Neg Hx     BP 151/86  Pulse 78  Ht 5\' 4"  (1.626 m)  Wt 133 lb (60.328 kg)  BMI 22.82 kg/m2  Review of Systems: See HPI above.    Objective:  Physical Exam:  Gen: NAD  Left knee: Mild effusion.  No bruising, other deformity. TTP medial joint line.  No other tenderness. FROM. Negative ant/post drawers. Negative valgus/varus testing. Negative lachmanns. Positive mcmurrays and apleys.  Negative patellar apprehension. NV intact distally.  Left shoulder: No swelling, ecchymoses.  No gross deformity. No TTP. FROM with painful arc. Positive Hawkins, Neers. Negative Speeds, Yergasons. Strength 5/5 with empty can and resisted internal/external rotation.  Pain empty can. Negative  apprehension. NV intact distally.    Assessment & Plan:  1. Left knee injury - consistent with medial meniscus tear.  Start with conservative treatment - icing, nsaids, home exercises which were reviewed today.  Consider PT, injection if not improving.  F/u in 1 month.  2. Left shoulder pain - 2/2 rotator cuff impingement.  Start home exercises, avoid overhead and reaching motions.  Consider injection, physical therapy, nitro, further imaging if not improving.  F/u in 1 month.

## 2014-06-02 ENCOUNTER — Encounter: Payer: Self-pay | Admitting: Internal Medicine

## 2014-06-02 ENCOUNTER — Ambulatory Visit (INDEPENDENT_AMBULATORY_CARE_PROVIDER_SITE_OTHER): Payer: Medicare Other | Admitting: Internal Medicine

## 2014-06-02 VITALS — BP 158/89 | HR 82 | Temp 97.4°F | Ht 63.0 in | Wt 134.2 lb

## 2014-06-02 DIAGNOSIS — R030 Elevated blood-pressure reading, without diagnosis of hypertension: Secondary | ICD-10-CM | POA: Diagnosis not present

## 2014-06-02 DIAGNOSIS — M150 Primary generalized (osteo)arthritis: Secondary | ICD-10-CM | POA: Diagnosis not present

## 2014-06-02 DIAGNOSIS — M810 Age-related osteoporosis without current pathological fracture: Secondary | ICD-10-CM | POA: Diagnosis not present

## 2014-06-02 DIAGNOSIS — G4700 Insomnia, unspecified: Secondary | ICD-10-CM | POA: Diagnosis not present

## 2014-06-02 DIAGNOSIS — IMO0001 Reserved for inherently not codable concepts without codable children: Secondary | ICD-10-CM

## 2014-06-02 DIAGNOSIS — M159 Polyosteoarthritis, unspecified: Secondary | ICD-10-CM

## 2014-06-02 NOTE — Assessment & Plan Note (Signed)
Last Reclast May 2013, next in one year. Recommend thousand and vitamin D, see instructions

## 2014-06-02 NOTE — Progress Notes (Signed)
Subjective:    Patient ID: Mary Jordan, female    DOB: 1935-08-04, 79 y.o.   MRN: [id number]  DOS:  06/02/2014 Type of visit - description : rov Interval history: Osteoporosis, not taking calcium or vitamin D routinely; s/p reclast few months ago, no s/e High cholesterol, good compliance w/ medication, labs reviewed, excellent response Reports good compliance with B12 shots BP today slightly elevated today, her BP at home is consistently normal although has not checked lately.   ROS Denies chest pain or difficulty breathing No  nausea, vomiting, diarrhea  Past Medical History  Diagnosis Date  . Lung mass     s/p excision of RUL benign 2007  . COPD (chronic obstructive pulmonary disease)   . Osteoporosis   . Osteoarthritis   . Hyperlipidemia   . Anemia   . B12 deficiency   . Menopause   . Insomnia   . Allergic rhinitis     Past Surgical History  Procedure Laterality Date  . Abdominal hysterectomy    . Breast biopsy      remotely, neg  . Lesion from lung excised, benign  05-2006  . Cardiovascular stress test  2007    Cardiolite negative  . Cataract extraction, bilateral      History   Social History  . Marital Status: Divorced    Spouse Name: N/A    Number of Children: 2  . Years of Education: N/A   Occupational History  . Retired     Network engineer   Social History Main Topics  . Smoking status: Former Smoker -- 1.00 packs/day for 39 years    Types: Cigarettes    Quit date: 08/11/1996  . Smokeless tobacco: Never Used     Comment:    . Alcohol Use: 3.6 oz/week    3 Cans of beer, 3 Glasses of wine per week     Comment: three times weekly  . Drug Use: No  . Sexual Activity: Not on file   Other Topics Concern  . Not on file   Social History Narrative   Lives by herself    Has 2 children, 4 gkids all boys                Medication List       This list is accurate as of: 06/02/14 11:59 PM.  Always use your most recent med list.               albuterol 108 (90 BASE) MCG/ACT inhaler  Commonly known as:  PROVENTIL HFA;VENTOLIN HFA  Inhale 2 puffs into the lungs every 6 (six) hours as needed for wheezing or shortness of breath.     aspirin 81 MG tablet  Take 81 mg by mouth daily.     budesonide-formoterol 160-4.5 MCG/ACT inhaler  Commonly known as:  SYMBICORT  Inhale 2 puffs into the lungs 2 (two) times daily as needed.     clorazepate 3.75 MG tablet  Commonly known as:  TRANXENE  Take 1 tablet (3.75 mg total) by mouth at bedtime as needed for sleep.     cyanocobalamin 1000 MCG/ML injection  Commonly known as:  (VITAMIN B-12)  Inject 1,000 mcg into the muscle every 30 (thirty) days.     ibuprofen 800 MG tablet  Commonly known as:  ADVIL,MOTRIN  Take 1 tablet (800 mg total) by mouth every 8 (eight) hours as needed.     simvastatin 10 MG tablet  Commonly known as:  ZOCOR  Take 1  tablet (10 mg total) by mouth at bedtime.     vitamin E 100 UNIT capsule  Take 100 Units by mouth daily.           Objective:   Physical Exam BP 158/89  Pulse 82  Temp(Src) 97.4 F (36.3 C) (Oral)  Ht 5\' 3"  (1.6 m)  Wt 134 lb 4 oz (60.895 kg)  BMI 23.79 kg/m2  SpO2 94% General -- alert, well-developed, NAD.   Lungs -- normal respiratory effort, no intercostal retractions, no accessory muscle use, and decreased  breath sounds.  Heart-- normal rate, regular rhythm, no murmur.  Extremities-- no pretibial edema bilaterally  Neurologic--  alert & oriented X3. Speech normal, gait appropriate for age, strength symmetric and appropriate for age.  Psych-- Cognition and judgment appear intact. Cooperative with normal attention span and concentration. No anxious or depressed appearing.       Assessment & Plan:

## 2014-06-02 NOTE — Assessment & Plan Note (Signed)
BP consistently normal in the ambulatory setting although she has not checked lately. Recommend to continue monitoring her ambulatory BPs

## 2014-06-02 NOTE — Assessment & Plan Note (Signed)
Taking ibuprofen 3 times a day, recommend to minimize the dose- frequency of  ibuprofen due to potential for complications

## 2014-06-02 NOTE — Progress Notes (Signed)
Pre visit review using our clinic review tool, if applicable. No additional management support is needed unless otherwise documented below in the visit note. 

## 2014-06-02 NOTE — Patient Instructions (Signed)
Take over-the-counter vitamin D between 600 units and 1000 units daily Take calcium supplements approximately 1 gram a day    Please come back to the office by 4 -2016  for a physical exam. Come back fasting

## 2014-06-02 NOTE — Assessment & Plan Note (Signed)
Symptoms well-controlled with clorazepate, UDS low risk  01/2014, will refill when necessary

## 2014-06-23 ENCOUNTER — Ambulatory Visit: Payer: Medicare Other | Admitting: Family Medicine

## 2014-06-27 ENCOUNTER — Telehealth: Payer: Self-pay | Admitting: Family Medicine

## 2014-06-27 MED ORDER — DICLOFENAC SODIUM 1 % TD GEL: 4.0000 g | Freq: Four times a day (QID) | TRANSDERMAL | Status: DC

## 2014-06-27 NOTE — Telephone Encounter (Signed)
Sent voltaren gel in to [facility]. Thanks!

## 2014-06-30 ENCOUNTER — Other Ambulatory Visit: Payer: Self-pay | Admitting: *Deleted

## 2014-06-30 MED ORDER — BUDESONIDE-FORMOTEROL FUMARATE 160-4.5 MCG/ACT IN AERO: 2.0000 | INHALATION_SPRAY | Freq: Two times a day (BID) | RESPIRATORY_TRACT | Status: DC | PRN

## 2014-10-10 DIAGNOSIS — D3131 Benign neoplasm of right choroid: Secondary | ICD-10-CM | POA: Diagnosis not present

## 2014-11-06 ENCOUNTER — Telehealth: Payer: Self-pay | Admitting: Internal Medicine

## 2014-11-06 NOTE — Telephone Encounter (Signed)
Pre visit letter sent  °

## 2014-11-23 ENCOUNTER — Other Ambulatory Visit: Payer: Self-pay | Admitting: Internal Medicine

## 2014-11-23 NOTE — Telephone Encounter (Signed)
Rx faxed to [facility].

## 2014-11-23 NOTE — Telephone Encounter (Signed)
Okay 30 and 3 refills 

## 2014-11-23 NOTE — Telephone Encounter (Signed)
Pt is requesting refill on Clorazepate.  Last OV: 06/02/2014 Last Fill: 05/24/2014 #30 2RF UDS: 02/03/2014 Low risk  Please advise.

## 2014-11-23 NOTE — Telephone Encounter (Signed)
Rx printed, awaiting MD signature.  

## 2014-11-24 ENCOUNTER — Encounter: Payer: Medicare Other | Admitting: Internal Medicine

## 2014-11-30 ENCOUNTER — Other Ambulatory Visit: Payer: Self-pay | Admitting: Internal Medicine

## 2014-11-30 DIAGNOSIS — Z1231 Encounter for screening mammogram for malignant neoplasm of breast: Secondary | ICD-10-CM

## 2015-01-05 ENCOUNTER — Emergency Department (HOSPITAL_BASED_OUTPATIENT_CLINIC_OR_DEPARTMENT_OTHER): Payer: Medicare Other

## 2015-01-05 ENCOUNTER — Encounter (HOSPITAL_BASED_OUTPATIENT_CLINIC_OR_DEPARTMENT_OTHER): Payer: Self-pay | Admitting: Emergency Medicine

## 2015-01-05 ENCOUNTER — Other Ambulatory Visit: Payer: Self-pay

## 2015-01-05 ENCOUNTER — Emergency Department (HOSPITAL_BASED_OUTPATIENT_CLINIC_OR_DEPARTMENT_OTHER)
Admission: EM | Admit: 2015-01-05 | Discharge: 2015-01-05 | Disposition: A | Discharge status: Home / Self Care | Payer: Medicare Other | Attending: Emergency Medicine | Admitting: Emergency Medicine

## 2015-01-05 DIAGNOSIS — R0602 Shortness of breath: Secondary | ICD-10-CM | POA: Diagnosis present

## 2015-01-05 DIAGNOSIS — D649 Anemia, unspecified: Secondary | ICD-10-CM | POA: Diagnosis not present

## 2015-01-05 DIAGNOSIS — Z87891 Personal history of nicotine dependence: Secondary | ICD-10-CM | POA: Insufficient documentation

## 2015-01-05 DIAGNOSIS — R079 Chest pain, unspecified: Secondary | ICD-10-CM | POA: Diagnosis not present

## 2015-01-05 DIAGNOSIS — Z79899 Other long term (current) drug therapy: Secondary | ICD-10-CM | POA: Diagnosis not present

## 2015-01-05 DIAGNOSIS — J159 Unspecified bacterial pneumonia: Secondary | ICD-10-CM | POA: Diagnosis not present

## 2015-01-05 DIAGNOSIS — M1990 Unspecified osteoarthritis, unspecified site: Secondary | ICD-10-CM | POA: Diagnosis not present

## 2015-01-05 DIAGNOSIS — E785 Hyperlipidemia, unspecified: Secondary | ICD-10-CM | POA: Diagnosis not present

## 2015-01-05 DIAGNOSIS — Z791 Long term (current) use of non-steroidal anti-inflammatories (NSAID): Secondary | ICD-10-CM | POA: Insufficient documentation

## 2015-01-05 DIAGNOSIS — Z7982 Long term (current) use of aspirin: Secondary | ICD-10-CM | POA: Diagnosis not present

## 2015-01-05 DIAGNOSIS — J189 Pneumonia, unspecified organism: Secondary | ICD-10-CM

## 2015-01-05 DIAGNOSIS — R509 Fever, unspecified: Secondary | ICD-10-CM | POA: Diagnosis not present

## 2015-01-05 DIAGNOSIS — R05 Cough: Secondary | ICD-10-CM | POA: Diagnosis not present

## 2015-01-05 DIAGNOSIS — Z780 Asymptomatic menopausal state: Secondary | ICD-10-CM | POA: Insufficient documentation

## 2015-01-05 DIAGNOSIS — Z8669 Personal history of other diseases of the nervous system and sense organs: Secondary | ICD-10-CM | POA: Diagnosis not present

## 2015-01-05 DIAGNOSIS — E538 Deficiency of other specified B group vitamins: Secondary | ICD-10-CM | POA: Insufficient documentation

## 2015-01-05 DIAGNOSIS — J441 Chronic obstructive pulmonary disease with (acute) exacerbation: Secondary | ICD-10-CM | POA: Diagnosis not present

## 2015-01-05 LAB — COMPREHENSIVE METABOLIC PANEL
ALT: 18 U/L (ref 14–54)
ANION GAP: 12 (ref 5–15)
AST: 17 U/L (ref 15–41)
Albumin: 3.9 g/dL (ref 3.5–5.0)
Alkaline Phosphatase: 73 U/L (ref 38–126)
BUN: 8 mg/dL (ref 6–20)
CALCIUM: 9.1 mg/dL (ref 8.9–10.3)
CHLORIDE: 102 mmol/L (ref 101–111)
CO2: 22 mmol/L (ref 22–32)
CREATININE: 0.59 mg/dL (ref 0.44–1.00)
GFR calc Af Amer: >60 mL/min (ref >60)
GFR calc non Af Amer: >60 mL/min (ref >60)
Glucose, Bld: 122 mg/dL — ABNORMAL HIGH (ref 65–99)
Potassium: 3.2 mmol/L — ABNORMAL LOW (ref 3.5–5.1)
Sodium: 136 mmol/L (ref 135–145)
Total Bilirubin: 0.9 mg/dL (ref 0.3–1.2)
Total Protein: 7.8 g/dL (ref 6.5–8.1)

## 2015-01-05 LAB — URINALYSIS, ROUTINE W REFLEX MICROSCOPIC
Bilirubin Urine: NEGATIVE
GLUCOSE, UA: NEGATIVE mg/dL
Ketones, ur: NEGATIVE mg/dL
NITRITE: NEGATIVE
PH: 6.5 (ref 5.0–8.0)
Protein, ur: NEGATIVE mg/dL
Specific Gravity, Urine: 1.004 — ABNORMAL LOW (ref 1.005–1.030)
Urobilinogen, UA: 0.2 mg/dL (ref 0.0–1.0)

## 2015-01-05 LAB — CBC WITH DIFFERENTIAL/PLATELET
Basophils Absolute: 0 10*3/uL (ref 0.0–0.1)
Basophils Relative: 0 % (ref 0–1)
EOS PCT: 1 % (ref 0–5)
Eosinophils Absolute: 0.1 10*3/uL (ref 0.0–0.7)
HEMATOCRIT: 43.5 % (ref 36.0–46.0)
HEMOGLOBIN: 14.2 g/dL (ref 12.0–15.0)
LYMPHS ABS: 0.8 10*3/uL (ref 0.7–4.0)
Lymphocytes Relative: 7 % — ABNORMAL LOW (ref 12–46)
MCH: 29.4 pg (ref 26.0–34.0)
MCHC: 32.6 g/dL (ref 30.0–36.0)
MCV: 90.1 fL (ref 78.0–100.0)
MONO ABS: 1.4 10*3/uL — AB (ref 0.1–1.0)
Monocytes Relative: 12 % (ref 3–12)
NEUTROS ABS: 9 10*3/uL — AB (ref 1.7–7.7)
Neutrophils Relative %: 80 % — ABNORMAL HIGH (ref 43–77)
Platelets: 313 10*3/uL (ref 150–400)
RBC: 4.83 MIL/uL (ref 3.87–5.11)
RDW: 13.4 % (ref 11.5–15.5)
WBC: 11.3 10*3/uL — ABNORMAL HIGH (ref 4.0–10.5)

## 2015-01-05 LAB — URINE MICROSCOPIC-ADD ON

## 2015-01-05 LAB — TROPONIN I: Troponin I: <0.03 ng/mL (ref <0.031)

## 2015-01-05 MED ORDER — ACETAMINOPHEN 325 MG PO TABS
650.0000 mg | ORAL_TABLET | Freq: Once | ORAL | Status: AC
  Administered 2015-01-05: 650 mg via ORAL
  Filled 2015-01-05: qty 2

## 2015-01-05 MED ORDER — DOXYCYCLINE HYCLATE 100 MG PO TABS
100.0000 mg | ORAL_TABLET | Freq: Once | ORAL | Status: AC
  Administered 2015-01-05: 100 mg via ORAL
  Filled 2015-01-05: qty 1

## 2015-01-05 MED ORDER — SODIUM CHLORIDE 0.9 % IV BOLUS (SEPSIS)
1000.0000 mL | INTRAVENOUS | Status: AC
  Administered 2015-01-05: 1000 mL via INTRAVENOUS

## 2015-01-05 MED ORDER — DOXYCYCLINE HYCLATE 100 MG PO CAPS: 100.0000 mg | ORAL_CAPSULE | Freq: Two times a day (BID) | ORAL | Status: DC

## 2015-01-05 MED ORDER — METOCLOPRAMIDE HCL 5 MG/ML IJ SOLN
5.0000 mg | Freq: Once | INTRAMUSCULAR | Status: AC
  Administered 2015-01-05: 5 mg via INTRAVENOUS
  Filled 2015-01-05: qty 2

## 2015-01-05 MED ORDER — DIPHENHYDRAMINE HCL 50 MG/ML IJ SOLN
12.5000 mg | Freq: Once | INTRAMUSCULAR | Status: AC
  Administered 2015-01-05: 12.5 mg via INTRAVENOUS
  Filled 2015-01-05: qty 1

## 2015-01-05 MED ORDER — SODIUM CHLORIDE 0.9 % IV BOLUS (SEPSIS)
500.0000 mL | Freq: Once | INTRAVENOUS | Status: AC
Start: 2015-01-05 — End: 2015-01-05
  Administered 2015-01-05: 500 mL via INTRAVENOUS

## 2015-01-05 NOTE — ED Notes (Signed)
Patient transported to X-ray 

## 2015-01-05 NOTE — ED Provider Notes (Signed)
CSN: [id number]     Arrival date & time 01/05/15  4332 History   First MD Initiated Contact with Patient 01/05/15 0840     Chief Complaint  Patient presents with  . Shortness of Breath     (Consider location/radiation/quality/duration/timing/severity/associated sxs/prior Treatment) Patient is a 80 y.o. female presenting with shortness of breath. The history is provided by the patient.  Shortness of Breath Severity:  Mild Onset quality:  Gradual Duration:  2 days Timing:  Intermittent Context: activity and URI   Associated symptoms: chest pain, cough, fever, headaches and sore throat   Associated symptoms: no abdominal pain, no neck pain and no vomiting   Cough:    Cough characteristics:  Productive   Sputum characteristics:  Yellow   Severity:  Mild   Onset quality:  Gradual   Duration:  2 days   Timing:  Constant   Progression:  Unchanged   Chronicity:  New Headaches:    Severity:  Mild   Onset quality:  Gradual   Duration:  2 days   Timing:  Constant   Progression:  Improving   Chronicity:  New   Past Medical History  Diagnosis Date  . Lung mass     s/p excision of RUL benign 2007  . COPD (chronic obstructive pulmonary disease)   . Osteoporosis   . Osteoarthritis   . Hyperlipidemia   . Anemia   . B12 deficiency   . Menopause   . Insomnia   . Allergic rhinitis    Past Surgical History  Procedure Laterality Date  . Abdominal hysterectomy    . Breast biopsy      remotely, neg  . Lesion from lung excised, benign  05-2006  . Cardiovascular stress test  2007    Cardiolite negative  . Cataract extraction, bilateral     Family History  Problem Relation Age of Onset  . Heart attack Father 69  . Hyperlipidemia Neg Hx   . Dementia Mother   . Breast cancer Mother   . Asthma Mother   . Diabetes Neg Hx   . Stroke Neg Hx   . Colon cancer Neg Hx    History  Substance Use Topics  . Smoking status: Former Smoker -- 1.00 packs/day for 39 years    Types:  Cigarettes    Quit date: 08/11/1996  . Smokeless tobacco: Never Used     Comment:    . Alcohol Use: 3.6 oz/week    3 Cans of beer, 3 Glasses of wine per week     Comment: three times weekly   OB History    No data available     Review of Systems  Constitutional: Positive for fever. Negative for fatigue.  HENT: Positive for sore throat. Negative for congestion and drooling.   Eyes: Negative for pain.  Respiratory: Positive for cough and shortness of breath.   Cardiovascular: Positive for chest pain.  Gastrointestinal: Negative for nausea, vomiting, abdominal pain and diarrhea.  Genitourinary: Negative for dysuria and hematuria.  Musculoskeletal: Negative for back pain, gait problem and neck pain.  Skin: Negative for color change.  Neurological: Positive for headaches. Negative for dizziness.  Hematological: Negative for adenopathy.  Psychiatric/Behavioral: Negative for behavioral problems.  All other systems reviewed and are negative.     Allergies  Levofloxacin  Home Medications   Prior to Admission medications   Medication Sig Start Date End Date Taking? Authorizing Provider  albuterol (PROVENTIL HFA;VENTOLIN HFA) 108 (90 BASE) MCG/ACT inhaler Inhale 2 puffs into  the lungs every 6 (six) hours as needed for wheezing or shortness of breath. 03/08/14  Yes Elsie Stain, MD  aspirin 81 MG tablet Take 81 mg by mouth daily.     Yes Historical Provider, MD  budesonide-formoterol (SYMBICORT) 160-4.5 MCG/ACT inhaler Inhale 2 puffs into the lungs 2 (two) times daily as needed. 06/30/14  Yes Elsie Stain, MD  cyanocobalamin (,VITAMIN B-12,) 1000 MCG/ML injection Inject 1,000 mcg into the muscle every 30 (thirty) days.   Yes Historical Provider, MD  ibuprofen (ADVIL,MOTRIN) 800 MG tablet Take 1 tablet (800 mg total) by mouth every 8 (eight) hours as needed. 05/24/14  Yes Dene Gentry, MD  vitamin E 100 UNIT capsule Take 100 Units by mouth daily.   Yes Historical Provider, MD   clorazepate (TRANXENE) 3.75 MG tablet Take 1 tablet (3.75 mg total) by mouth at bedtime as needed for sleep. 11/23/14   Colon Branch, MD  diclofenac sodium (VOLTAREN) 1 % GEL Apply 4 g topically 4 (four) times daily. 06/27/14   Dene Gentry, MD  simvastatin (ZOCOR) 10 MG tablet Take 1 tablet (10 mg total) by mouth at bedtime. 01/10/14   Colon Branch, MD   BP 126/72 mmHg  Pulse 132  Temp(Src) 97.6 F (36.4 C) (Oral)  Resp 22  Ht 5\' 4"  (1.626 m)  Wt 133 lb (60.328 kg)  BMI 22.82 kg/m2  SpO2 95% Physical Exam  Constitutional: She is oriented to person, place, and time. She appears well-developed and well-nourished.  HENT:  Head: Normocephalic.  Mouth/Throat: Oropharynx is clear and moist. No oropharyngeal exudate.  Eyes: Conjunctivae and EOM are normal. Pupils are equal, round, and reactive to light.  Neck: Normal range of motion. Neck supple.  Cardiovascular: Regular rhythm, normal heart sounds and intact distal pulses.  Exam reveals no gallop and no friction rub.   No murmur heard. HR 126 on my exam  Pulmonary/Chest: Breath sounds normal. No respiratory distress. She has no wheezes.  Mild tachypnea  Abdominal: Soft. Bowel sounds are normal. There is no tenderness. There is no rebound and no guarding.  Musculoskeletal: Normal range of motion. She exhibits no edema or tenderness.  Neurological: She is alert and oriented to person, place, and time.  alert, oriented x3 speech: normal in context and clarity memory: intact grossly cranial nerves II-XII: intact motor strength: full proximally and distally  sensation: intact to light touch diffusely  cerebellar: finger-to-nose and heel-to-shin intact gait: normal   Skin: Skin is warm and dry.  Psychiatric: She has a normal mood and affect. Her behavior is normal.  Nursing note and vitals reviewed.   ED Course  Procedures (including critical care time) Labs Review Labs Reviewed  CBC WITH DIFFERENTIAL/PLATELET - Abnormal; Notable for  the following:    WBC 11.3 (*)    Neutrophils Relative % 80 (*)    Neutro Abs 9.0 (*)    Lymphocytes Relative 7 (*)    Monocytes Absolute 1.4 (*)    All other components within normal limits  COMPREHENSIVE METABOLIC PANEL - Abnormal; Notable for the following:    Potassium 3.2 (*)    Glucose, Bld 122 (*)    All other components within normal limits  URINALYSIS, ROUTINE W REFLEX MICROSCOPIC (NOT AT [facility]) - Abnormal; Notable for the following:    Specific Gravity, Urine 1.004 (*)    Hgb urine dipstick TRACE (*)    Leukocytes, UA LARGE (*)    All other components within normal limits  URINE  CULTURE  TROPONIN I  URINE MICROSCOPIC-ADD ON    Imaging Review Dg Chest 2 View  01/05/2015   CLINICAL DATA:  Productive cough, fever and shortness of breath. Prior right upper lobe pulmonary resection.  EXAM: CHEST - 2 VIEW  COMPARISON:  09/05/2010  FINDINGS: More prominent density at the medial right upper lung field may represent infiltrate versus atelectasis. Radiographic follow-up is recommended, as underlying mass lesion cannot be completely excluded. Changes are again noted related to prior right lung resection. No edema, pneumothorax, pleural fluid or mediastinal abnormality is seen. The heart size is normal. Bony structures show stable moderate spondylosis of the thoracic spine.  IMPRESSION: New density in the medial right upper lung may represent infiltrate versus atelectasis. Underlying mass is not completely excluded. Recommend radiographic follow-up.   Electronically Signed   By: Aletta Edouard M.D.   On: 01/05/2015 09:29     EKG Interpretation   Date/Time:  Friday Jan 05 2015 09:37:23 EDT Ventricular Rate:  113 PR Interval:  148 QRS Duration: 92 QT Interval:  348 QTC Calculation: 477 R Axis:   81 Text Interpretation:  Sinus tachycardia Otherwise normal ECG No  significant change since last tracing Confirmed by Jahiem Franzoni  MD, Lucill Mauck  ([id number]) on 01/05/2015 9:43:28 AM      MDM    Final diagnoses:  CAP (community acquired pneumonia)    9:19 AM 80 y.o. female w hx of copd, former smoker, anemia who presents with cough productive of yellowish sputum, sore throat, fever, shortness of breath, and headache for the last 2 days. She states that she had a gradual onset frontal and vertex headache which began 2 days ago. Moderately improved with over-the-counter medications and currently 5 out of 10. She also notes some mild shortness of breath mostly with exertion. She does have some chest wall pain during coughing but not otherwise. She is tachycardic here with a heart rate in the 120s and up to the 130s. Vital signs otherwise stable. She is a good story for infectious etiology such as pneumonia. Her headache is mild and she has normal range of motion of the neck. Do not think this is meningitis. Will treat symptomatically with headache cocktail, IV fluids, screening lab work and imaging.  Heart rate has decreased significantly with 1.5 L of IV fluids. She continues to have a heart rate in the low 100s from 99-104. Her vital signs are otherwise unremarkable. She is ambulatory without hypoxia. She is breathing comfortably on exam. She is feeling better on exam. I offered admission but she preferred to go home and I think this is reasonable. Port score of 77 consistent with inpatient or outpatient treatment. Given significant improvement in her heart rate and continued well appearance I think it is reasonable to try outpatient treatment. Will recommend that she follow-up closely with her primary care doctor and return for any worsening chest pain, shortness of breath, or inability to tolerate her abx or fluids.   Pamella Pert, MD 01/05/15 1134

## 2015-01-05 NOTE — ED Notes (Signed)
Increased SOB, with brown sputum productive cough and fever. History of COPD. Also complaining of headache.

## 2015-01-05 NOTE — ED Notes (Signed)
Ambulated with Assist x 2 in hallway on Pulse Ox.  HR 110-114, SpO2 on rooma 93-94%, RR 18-24, bronchitic cough/congested.

## 2015-01-06 ENCOUNTER — Encounter (HOSPITAL_BASED_OUTPATIENT_CLINIC_OR_DEPARTMENT_OTHER): Payer: Self-pay | Admitting: *Deleted

## 2015-01-06 ENCOUNTER — Emergency Department (HOSPITAL_BASED_OUTPATIENT_CLINIC_OR_DEPARTMENT_OTHER)
Admission: EM | Admit: 2015-01-06 | Discharge: 2015-01-06 | Disposition: A | Discharge status: Home / Self Care | Payer: Medicare Other | Attending: Emergency Medicine | Admitting: Emergency Medicine

## 2015-01-06 DIAGNOSIS — D649 Anemia, unspecified: Secondary | ICD-10-CM | POA: Insufficient documentation

## 2015-01-06 DIAGNOSIS — R0602 Shortness of breath: Secondary | ICD-10-CM | POA: Insufficient documentation

## 2015-01-06 DIAGNOSIS — R05 Cough: Secondary | ICD-10-CM | POA: Diagnosis not present

## 2015-01-06 DIAGNOSIS — R059 Cough, unspecified: Secondary | ICD-10-CM

## 2015-01-06 DIAGNOSIS — Z780 Asymptomatic menopausal state: Secondary | ICD-10-CM | POA: Insufficient documentation

## 2015-01-06 DIAGNOSIS — E538 Deficiency of other specified B group vitamins: Secondary | ICD-10-CM | POA: Insufficient documentation

## 2015-01-06 DIAGNOSIS — E785 Hyperlipidemia, unspecified: Secondary | ICD-10-CM | POA: Insufficient documentation

## 2015-01-06 DIAGNOSIS — Z792 Long term (current) use of antibiotics: Secondary | ICD-10-CM | POA: Insufficient documentation

## 2015-01-06 DIAGNOSIS — R51 Headache: Secondary | ICD-10-CM | POA: Diagnosis not present

## 2015-01-06 DIAGNOSIS — Z87891 Personal history of nicotine dependence: Secondary | ICD-10-CM | POA: Insufficient documentation

## 2015-01-06 DIAGNOSIS — M1990 Unspecified osteoarthritis, unspecified site: Secondary | ICD-10-CM | POA: Insufficient documentation

## 2015-01-06 DIAGNOSIS — R069 Unspecified abnormalities of breathing: Secondary | ICD-10-CM | POA: Diagnosis not present

## 2015-01-06 DIAGNOSIS — J441 Chronic obstructive pulmonary disease with (acute) exacerbation: Secondary | ICD-10-CM | POA: Diagnosis not present

## 2015-01-06 DIAGNOSIS — G4700 Insomnia, unspecified: Secondary | ICD-10-CM | POA: Diagnosis not present

## 2015-01-06 DIAGNOSIS — Z7982 Long term (current) use of aspirin: Secondary | ICD-10-CM | POA: Diagnosis not present

## 2015-01-06 DIAGNOSIS — Z79899 Other long term (current) drug therapy: Secondary | ICD-10-CM | POA: Insufficient documentation

## 2015-01-06 DIAGNOSIS — R062 Wheezing: Secondary | ICD-10-CM | POA: Diagnosis not present

## 2015-01-06 LAB — URINE CULTURE

## 2015-01-06 MED ORDER — ALBUTEROL SULFATE (2.5 MG/3ML) 0.083% IN NEBU
2.5000 mg | INHALATION_SOLUTION | RESPIRATORY_TRACT | Status: DC | PRN
  Administered 2015-01-06: 2.5 mg via RESPIRATORY_TRACT
  Filled 2015-01-06: qty 3

## 2015-01-06 MED ORDER — ALBUTEROL SULFATE HFA 108 (90 BASE) MCG/ACT IN AERS: 1.0000 | INHALATION_SPRAY | Freq: Four times a day (QID) | RESPIRATORY_TRACT | Status: DC | PRN

## 2015-01-06 MED ORDER — HYDROCODONE-ACETAMINOPHEN 5-325 MG PO TABS: 1.0000 | ORAL_TABLET | ORAL | Status: DC | PRN

## 2015-01-06 MED ORDER — HYDROCODONE-ACETAMINOPHEN 5-325 MG PO TABS
1.0000 | ORAL_TABLET | Freq: Once | ORAL | Status: AC
  Administered 2015-01-06: 1 via ORAL
  Filled 2015-01-06: qty 1

## 2015-01-06 MED ORDER — GUAIFENESIN ER 600 MG PO TB12: 600.0000 mg | ORAL_TABLET | Freq: Two times a day (BID) | ORAL | Status: DC

## 2015-01-06 MED ORDER — LORAZEPAM 2 MG/ML IJ SOLN: 1.0000 mg | INTRAMUSCULAR | Status: DC | PRN

## 2015-01-06 MED ORDER — ALBUTEROL SULFATE HFA 108 (90 BASE) MCG/ACT IN AERS
INHALATION_SPRAY | RESPIRATORY_TRACT | Status: AC
  Administered 2015-01-06: 2
  Filled 2015-01-06: qty 6.7

## 2015-01-06 MED ORDER — PREDNISONE 20 MG PO TABS
40.0000 mg | ORAL_TABLET | Freq: Once | ORAL | Status: AC
  Administered 2015-01-06: 40 mg via ORAL
  Filled 2015-01-06: qty 2

## 2015-01-06 MED ORDER — PREDNISONE 20 MG PO TABS: 20.0000 mg | ORAL_TABLET | Freq: Two times a day (BID) | ORAL | Status: DC

## 2015-01-06 NOTE — ED Notes (Signed)
States HA is "just about gone" "I feel som much better"

## 2015-01-06 NOTE — ED Notes (Signed)
Pt medicated per EDP orders, pt placed on stretcher, comfort measures provided, cont POX monitoring initiated.

## 2015-01-06 NOTE — ED Notes (Signed)
MD at bedside. 

## 2015-01-06 NOTE — ED Notes (Signed)
Pt seen yesterday in this department, with Dx of Community Acquired Pneumonia, returns today, states still having cough and unable to sleep. Received Rx yesterday of Doxycycline, two doses taken since ED DC yesterday

## 2015-01-06 NOTE — Discharge Instructions (Signed)
Cough, Adult  A cough is a reflex that helps clear your throat and airways. It can help heal the body or may be a reaction to an irritated airway. A cough may only last 2 or 3 weeks (acute) or may last more than 8 weeks (chronic).  CAUSES Acute cough:  Viral or bacterial infections. Chronic cough:  Infections.  Allergies.  Asthma.  Post-nasal drip.  Smoking.  Heartburn or acid reflux.  Some medicines.  Chronic lung problems (COPD).  Cancer. SYMPTOMS   Cough.  Fever.  Chest pain.  Increased breathing rate.  High-pitched whistling sound when breathing (wheezing).  Colored mucus that you cough up (sputum). TREATMENT   A bacterial cough may be treated with antibiotic medicine.  A viral cough must run its course and will not respond to antibiotics.  Your caregiver may recommend other treatments if you have a chronic cough. HOME CARE INSTRUCTIONS   Only take over-the-counter or prescription medicines for pain, discomfort, or fever as directed by your caregiver. Use cough suppressants only as directed by your caregiver.  Use a cold steam vaporizer or humidifier in your bedroom or home to help loosen secretions.  Sleep in a semi-upright position if your cough is worse at night.  Rest as needed.  Stop smoking if you smoke. SEEK IMMEDIATE MEDICAL CARE IF:   You have pus in your sputum.  Your cough starts to worsen.  You cannot control your cough with suppressants and are losing sleep.  You begin coughing up blood.  You have difficulty breathing.  You develop pain which is getting worse or is uncontrolled with medicine.  You have a fever. MAKE SURE YOU:   Understand these instructions.  Will watch your condition.  Will get help right away if you are not doing well or get worse. Document Released: 01/24/2011 Document Revised: 10/20/2011 Document Reviewed: 01/24/2011 Exit[employer] Patient Information 2015 [employer]. This information is not intended  to replace advice given to you by your health care provider. Make sure you discuss any questions you have with your health care provider.  

## 2015-01-06 NOTE — ED Notes (Signed)
Returns to ED, states still has cough and unable to sleep

## 2015-01-06 NOTE — ED Provider Notes (Signed)
CSN: [id number]     Arrival date & time 01/06/15  5102 History   First MD Initiated Contact with Patient 01/06/15 [phone]     Chief Complaint  Patient presents with  . Cough      HPI  She presents for evaluation. Seen yesterday with exacerbation of COPD and subtle x-ray abnormalities and discharged on doxycycline. She states this morning she began coughing again. She states that she "panicked". She states she felt like she had something in her throat that she cannot cough up. States she was coughing home and could not stop. She states she called "that nurse and doctor yesterday". States she was told to not drive, to call paramedics. She was brought in by ambulance.  Past Medical History  Diagnosis Date  . Lung mass     s/p excision of RUL benign 2007  . COPD (chronic obstructive pulmonary disease)   . Osteoporosis   . Osteoarthritis   . Hyperlipidemia   . Anemia   . B12 deficiency   . Menopause   . Insomnia   . Allergic rhinitis    Past Surgical History  Procedure Laterality Date  . Abdominal hysterectomy    . Breast biopsy      remotely, neg  . Lesion from lung excised, benign  05-2006  . Cardiovascular stress test  2007    Cardiolite negative  . Cataract extraction, bilateral     Family History  Problem Relation Age of Onset  . Heart attack Father 23  . Hyperlipidemia Neg Hx   . Dementia Mother   . Breast cancer Mother   . Asthma Mother   . Diabetes Neg Hx   . Stroke Neg Hx   . Colon cancer Neg Hx    History  Substance Use Topics  . Smoking status: Former Smoker -- 1.00 packs/day for 39 years    Types: Cigarettes    Quit date: 08/11/1996  . Smokeless tobacco: Never Used     Comment:    . Alcohol Use: 3.6 oz/week    3 Cans of beer, 3 Glasses of wine per week     Comment: three times weekly   OB History    No data available     Review of Systems  Constitutional: Negative for fever, chills, diaphoresis, appetite change and fatigue.  HENT: Negative for  mouth sores, sore throat and trouble swallowing.   Eyes: Negative for visual disturbance.  Respiratory: Positive for cough and shortness of breath. Negative for chest tightness and wheezing.   Cardiovascular: Negative for chest pain.  Gastrointestinal: Negative for nausea, vomiting, abdominal pain, diarrhea and abdominal distention.  Endocrine: Negative for polydipsia, polyphagia and polyuria.  Genitourinary: Negative for dysuria, frequency and hematuria.  Musculoskeletal: Negative for gait problem.  Skin: Negative for color change, pallor and rash.  Neurological: Positive for headaches. Negative for dizziness, syncope and light-headedness.  Hematological: Does not bruise/bleed easily.  Psychiatric/Behavioral: Negative for behavioral problems and confusion.      Allergies  Levofloxacin  Home Medications   Prior to Admission medications   Medication Sig Start Date End Date Taking? Authorizing Provider  albuterol (PROVENTIL HFA;VENTOLIN HFA) 108 (90 BASE) MCG/ACT inhaler Inhale 1-2 puffs into the lungs every 6 (six) hours as needed for wheezing. 01/06/15   Tanna Furry, MD  aspirin 81 MG tablet Take 81 mg by mouth daily.      Historical Provider, MD  budesonide-formoterol (SYMBICORT) 160-4.5 MCG/ACT inhaler Inhale 2 puffs into the lungs 2 (two) times daily as  needed. 06/30/14   Elsie Stain, MD  clorazepate (TRANXENE) 3.75 MG tablet Take 1 tablet (3.75 mg total) by mouth at bedtime as needed for sleep. 11/23/14   Colon Branch, MD  cyanocobalamin (,VITAMIN B-12,) 1000 MCG/ML injection Inject 1,000 mcg into the muscle every 30 (thirty) days.    Historical Provider, MD  diclofenac sodium (VOLTAREN) 1 % GEL Apply 4 g topically 4 (four) times daily. 06/27/14   Dene Gentry, MD  doxycycline (VIBRAMYCIN) 100 MG capsule Take 1 capsule (100 mg total) by mouth 2 (two) times daily. One po bid x 7 days 01/05/15   Pamella Pert, MD  guaiFENesin (MUCINEX) 600 MG 12 hr tablet Take 1 tablet (600 mg  total) by mouth 2 (two) times daily. 01/06/15   Tanna Furry, MD  HYDROcodone-acetaminophen (NORCO/VICODIN) 5-325 MG per tablet Take 1 tablet by mouth every 4 (four) hours as needed. 01/06/15   Tanna Furry, MD  ibuprofen (ADVIL,MOTRIN) 800 MG tablet Take 1 tablet (800 mg total) by mouth every 8 (eight) hours as needed. 05/24/14   Dene Gentry, MD  predniSONE (DELTASONE) 20 MG tablet Take 1 tablet (20 mg total) by mouth 2 (two) times daily with a meal. 01/06/15   Tanna Furry, MD  simvastatin (ZOCOR) 10 MG tablet Take 1 tablet (10 mg total) by mouth at bedtime. 01/10/14   Colon Branch, MD  vitamin E 100 UNIT capsule Take 100 Units by mouth daily.    Historical Provider, MD   BP 129/70 mmHg  Pulse 112  Temp(Src) 98 F (36.7 C) (Oral)  Resp 18  Ht 5\' 4"  (1.626 m)  Wt 130 lb (58.968 kg)  BMI 22.30 kg/m2  SpO2 95% Physical Exam  Constitutional: She is oriented to person, place, and time. She appears well-developed and well-nourished. No distress.  HENT:  Head: Normocephalic.  No sinus pressure. Posterior pharynx benign. Normal TMs. No adenopathy in the neck. No rash or vesicles across the head or face  Eyes: Conjunctivae are normal. Pupils are equal, round, and reactive to light. No scleral icterus.  Neck: Normal range of motion. Neck supple. No thyromegaly present.  Cardiovascular: Normal rate and regular rhythm.  Exam reveals no gallop and no friction rub.   No murmur heard. Pulmonary/Chest: Effort normal and breath sounds normal. No respiratory distress. She has no wheezes. She has no rales.  Wheezing and minimal prolongation in all fields.  Abdominal: Soft. Bowel sounds are normal. She exhibits no distension. There is no tenderness. There is no rebound.  Musculoskeletal: Normal range of motion.  Neurological: She is alert and oriented to person, place, and time.  Skin: Skin is warm and dry. No rash noted.  Psychiatric: She has a normal mood and affect. Her behavior is normal.    ED Course   Procedures (including critical care time) Labs Review Labs Reviewed - No data to display  Imaging Review Dg Chest 2 View  01/05/2015   CLINICAL DATA:  Productive cough, fever and shortness of breath. Prior right upper lobe pulmonary resection.  EXAM: CHEST - 2 VIEW  COMPARISON:  09/05/2010  FINDINGS: More prominent density at the medial right upper lung field may represent infiltrate versus atelectasis. Radiographic follow-up is recommended, as underlying mass lesion cannot be completely excluded. Changes are again noted related to prior right lung resection. No edema, pneumothorax, pleural fluid or mediastinal abnormality is seen. The heart size is normal. Bony structures show stable moderate spondylosis of the thoracic spine.  IMPRESSION: New density  in the medial right upper lung may represent infiltrate versus atelectasis. Underlying mass is not completely excluded. Recommend radiographic follow-up.   Electronically Signed   By: Aletta Edouard M.D.   On: 01/05/2015 09:29     EKG Interpretation None      MDM   Final diagnoses:  Cough    Patient given nebulized albuterol treatment and a single by mouth Vicodin. Recheck she is resting. She is semi-recumbent. Actually sleeping. 90% saturations. Heart rate 94. Colic she states that she "coughed up a bunch of stuff". States "I feel a whole lot better". She is less anxious. Her headache is improved. I think she is appropriate for continued outpatient treatment. Given prednisone and albuterol prescriptions. Continue her doxycycline. Mucinex, Vicodin to take for cough or headache. Primary care follow-up if not improving. Please continue doxycycline.    Tanna Furry, MD 01/06/15 [phone]

## 2015-01-06 NOTE — ED Notes (Signed)
Patient called to have a re -evaluation because she has been up coughing all night long. Patient sounds extremely SOB and dyspnic with talking. Unable to complete sentences over the phone talking with RN. Recommended that the patient call and ambulance and come to hospital immeditatly

## 2015-01-10 ENCOUNTER — Ambulatory Visit: Payer: Medicare Other | Admitting: Critical Care Medicine

## 2015-01-15 ENCOUNTER — Telehealth: Payer: Self-pay | Admitting: Adult Health

## 2015-01-15 NOTE — Telephone Encounter (Signed)
Patient says she is not feeling so well.  She says that she was diagnosed with Pneumonia.  She last night, she coughed so hard her entire body would shake, she can't catch her breath.  She tried robitussin, ibuprofen 800.  Patient is done with antibiotics and prednisone and wants to know if she should be taking more?  She would also like to know what she can take for the cough.    Allergies  Allergen Reactions  . Levofloxacin     REACTION: hives on chest

## 2015-01-15 NOTE — Telephone Encounter (Signed)
Needs OV with NP for this

## 2015-01-15 NOTE — Telephone Encounter (Signed)
Pt calling again about something for cough suppression - requesting to speak with nurse.

## 2015-01-15 NOTE — Telephone Encounter (Signed)
Called spoke with pt. She is scheduled to see TP on Thursday in [place]. Nothing further needed

## 2015-01-16 ENCOUNTER — Ambulatory Visit (HOSPITAL_BASED_OUTPATIENT_CLINIC_OR_DEPARTMENT_OTHER): Payer: Medicare Other

## 2015-01-18 ENCOUNTER — Ambulatory Visit (HOSPITAL_BASED_OUTPATIENT_CLINIC_OR_DEPARTMENT_OTHER)
Admission: RE | Admit: 2015-01-18 | Discharge: 2015-01-18 | Disposition: A | Discharge status: Home / Self Care | Payer: Medicare Other | Source: Ambulatory Visit | Attending: Adult Health | Admitting: Adult Health

## 2015-01-18 ENCOUNTER — Encounter: Payer: Self-pay | Admitting: Adult Health

## 2015-01-18 ENCOUNTER — Ambulatory Visit (INDEPENDENT_AMBULATORY_CARE_PROVIDER_SITE_OTHER): Payer: Medicare Other | Admitting: Adult Health

## 2015-01-18 VITALS — BP 147/78 | HR 97 | Temp 97.3°F | Ht 65.0 in | Wt 132.0 lb

## 2015-01-18 DIAGNOSIS — Z8701 Personal history of pneumonia (recurrent): Secondary | ICD-10-CM | POA: Diagnosis not present

## 2015-01-18 DIAGNOSIS — R0989 Other specified symptoms and signs involving the circulatory and respiratory systems: Secondary | ICD-10-CM | POA: Insufficient documentation

## 2015-01-18 DIAGNOSIS — J189 Pneumonia, unspecified organism: Secondary | ICD-10-CM

## 2015-01-18 DIAGNOSIS — R05 Cough: Secondary | ICD-10-CM | POA: Insufficient documentation

## 2015-01-18 DIAGNOSIS — J441 Chronic obstructive pulmonary disease with (acute) exacerbation: Secondary | ICD-10-CM | POA: Diagnosis not present

## 2015-01-18 MED ORDER — PREDNISONE 10 MG PO TABS: ORAL_TABLET | ORAL | Status: DC

## 2015-01-18 MED ORDER — ALBUTEROL SULFATE (2.5 MG/3ML) 0.083% IN NEBU
2.5000 mg | INHALATION_SOLUTION | Freq: Once | RESPIRATORY_TRACT | Status: AC
  Administered 2015-01-18: 2.5 mg via RESPIRATORY_TRACT

## 2015-01-18 MED ORDER — BENZONATATE 200 MG PO CAPS: 200.0000 mg | ORAL_CAPSULE | Freq: Three times a day (TID) | ORAL | Status: DC | PRN

## 2015-01-18 NOTE — Assessment & Plan Note (Signed)
Resolved on cxr  No further abx at this time.

## 2015-01-18 NOTE — Progress Notes (Signed)
   Subjective:    Patient ID: Mary Jordan, female    DOB: 1935/01/10, 80 y.o.   MRN: [id number]  HPI 04/20/2014 Chief Complaint  Patient presents with  . COPD    Follow-up. Pt states she has good and bad days. She states the high humidity causes her to have increased SOB at times. She states that SOB is not any worse then usual.   Copd f/u. No abx or pred given .  Cannot afford symbicort  01/18/2015 Acute OV : COPD -GOLD C /PNA  Presents for an acute office visit.  Complains of persistent cough and thick yellow mucus for last 2 week.s  Complains of coughing up yellow, gray, green mucus, this morning was really bad, throat and tongue is raw and red.  Seen in ED at [place] [facility] on 5/27 with AECOPD and possible CAP .  CXR showed atx vs infiltrate in RUL  Tx with Doxycycline and Prednisone x 7 days .  She has finished both without much improvement .  Cough is keeping her up at night.  Taking some otc cough meds.  CXR today shows resolution of PNA.  No hemoptysis , chest pain,orthopnea, edema or fever.  No n/v/d or blood stools.     Review of Systems Constitutional:   No  weight loss, night sweats,  Fevers, chills, fatigue, lassitude. HEENT:   No headaches,  Difficulty swallowing,  Tooth/dental problems,  Sore throat,                No sneezing, itching, ear ache,  +nasal congestion, post nasal drip,   CV:  No chest pain,  Orthopnea, PND, swelling in lower extremities, anasarca, dizziness, palpitations  GI  No heartburn, indigestion, abdominal pain, nausea, vomiting, diarrhea, change in bowel habits, loss of appetite  Resp: Notes shortness of breath with exertion or at rest.   .  No wheezing.  No chest wall deformity  Skin: no rash or lesions.  GU: no dysuria, change in color of urine, no urgency or frequency.  No flank pain.  MS:  No joint pain or swelling.  No decreased range of motion.  No back pain.  Psych:  No change in mood or affect. No depression or anxiety.  No  memory loss.     Objective:   Physical Exam  Gen: Pleasant, elderly and frail in no distress,  normal affect, barking cough   ENT: No lesions,  mouth clear,  oropharynx clear, no postnasal drip  Neck: No JVD, no TMG, no carotid bruits  Lungs: No use of accessory muscles, no dullness to percussion, distant breath sounds  Cardiovascular: RRR, heart sounds normal, no murmur or gallops, no peripheral edema  Abdomen: soft and NT, no HSM,  BS normal  Musculoskeletal: No deformities, no cyanosis or clubbing  Neuro: alert, non focal  Skin: Warm, no lesions or rashes.[email or web address]   CXR 01/18/2015  Resolved pNA  Chronic changes  Reviewed independently        Assessment & Plan:

## 2015-01-18 NOTE — Addendum Note (Signed)
Addended by: Mathis Dad on: 01/18/2015 11:36 AM   Modules accepted: Orders

## 2015-01-18 NOTE — Patient Instructions (Signed)
Prednisone taper over next week .  Mucinex DM Twice daily  As needed  Cough/congestion  Eat yogurt daily .  Follow up in 6 weeks and As needed   Please contact office for sooner follow up if symptoms do not improve or worsen or seek emergency care

## 2015-01-18 NOTE — Progress Notes (Signed)
Reviewed & agree with plan  

## 2015-01-18 NOTE — Assessment & Plan Note (Signed)
Slow to resolve flare  cxr w/ no pna  Albuterol neb given x1   Plan  Prednisone taper over next week .  Mucinex DM Twice daily  As needed  Cough/congestion  Eat yogurt daily .  Follow up in 6 weeks and As needed   Please contact office for sooner follow up if symptoms do not improve or worsen or seek emergency care

## 2015-01-18 NOTE — Addendum Note (Signed)
Addended by: Mathis Dad on: 01/18/2015 11:04 AM   Modules accepted: Orders, Medications

## 2015-03-01 ENCOUNTER — Encounter: Payer: Self-pay | Admitting: Adult Health

## 2015-03-01 ENCOUNTER — Ambulatory Visit (INDEPENDENT_AMBULATORY_CARE_PROVIDER_SITE_OTHER): Payer: Medicare Other | Admitting: Adult Health

## 2015-03-01 VITALS — BP 130/80 | HR 87 | Ht 64.0 in | Wt 132.0 lb

## 2015-03-01 DIAGNOSIS — J189 Pneumonia, unspecified organism: Secondary | ICD-10-CM

## 2015-03-01 DIAGNOSIS — J441 Chronic obstructive pulmonary disease with (acute) exacerbation: Secondary | ICD-10-CM | POA: Diagnosis not present

## 2015-03-01 NOTE — Patient Instructions (Signed)
Continue on current regimen  Follow up in 3 months and As needed

## 2015-03-01 NOTE — Assessment & Plan Note (Signed)
Resolved flare , returning to baseline.   Plan  Continue on current regimen  Follow up in 3 months and As needed

## 2015-03-01 NOTE — Progress Notes (Signed)
   Subjective:    Patient ID: Mary Jordan, female    DOB: [birth year], 80 y.o.   MRN: [id number]  80 yo female former smoker  With COPD -GOLD C   03/01/2015 Follow up COPD  Returns for 1 month follow up .  Seen last ov for COPD flare and resolving PNA .  She was given prednisone taper for slow to resolve COPD flare .  CXR showed resolved infiltrate after abx.  She is starting to feel better.  Strength is improved.  She has started back to her water exercises.   No hemoptysis , chest pain,orthopnea, edema or fever.  No n/v/d or blood stools.     Review of Systems Constitutional:   No  weight loss, night sweats,  Fevers, chills, fatigue, lassitude. HEENT:   No headaches,  Difficulty swallowing,  Tooth/dental problems,  Sore throat,                No sneezing, itching, ear ache, nasal congestion, post nasal drip,   CV:  No chest pain,  Orthopnea, PND, swelling in lower extremities, anasarca, dizziness, palpitations  GI  No heartburn, indigestion, abdominal pain, nausea, vomiting, diarrhea, change in bowel habits, loss of appetite  Resp: Notes shortness of breath with exertion or at rest.   .  No wheezing.  No chest wall deformity  Skin: no rash or lesions.  GU: no dysuria, change in color of urine, no urgency or frequency.  No flank pain.  MS:  No joint pain or swelling.  No decreased range of motion.  No back pain.  Psych:  No change in mood or affect. No depression or anxiety.  No memory loss.     Objective:   Physical Exam  Gen: Pleasant, elderly and frail in no distress,  normal affect   ENT: No lesions,  mouth clear,  oropharynx clear, no postnasal drip  Neck: No JVD, no TMG, no carotid bruits  Lungs: No use of accessory muscles, no dullness to percussion, distant breath sounds  Cardiovascular: RRR, heart sounds normal, no murmur or gallops, no peripheral edema  Abdomen: soft and NT, no HSM,  BS normal  Musculoskeletal: No deformities, no cyanosis or  clubbing  Neuro: alert, non focal  Skin: Warm, no lesions or rashes.assesspw   CXR 01/18/2015  Resolved pNA  Chronic changes          Assessment & Plan:

## 2015-03-01 NOTE — Assessment & Plan Note (Signed)
Resolved with cxr clearance

## 2015-03-02 ENCOUNTER — Encounter: Payer: Self-pay | Admitting: Internal Medicine

## 2015-03-02 ENCOUNTER — Ambulatory Visit (INDEPENDENT_AMBULATORY_CARE_PROVIDER_SITE_OTHER): Payer: Medicare Other | Admitting: Internal Medicine

## 2015-03-02 VITALS — BP 126/78 | HR 87 | Temp 98.4°F | Ht 64.0 in | Wt 133.1 lb

## 2015-03-02 DIAGNOSIS — M810 Age-related osteoporosis without current pathological fracture: Secondary | ICD-10-CM | POA: Diagnosis not present

## 2015-03-02 DIAGNOSIS — R030 Elevated blood-pressure reading, without diagnosis of hypertension: Secondary | ICD-10-CM

## 2015-03-02 DIAGNOSIS — Z79899 Other long term (current) drug therapy: Secondary | ICD-10-CM | POA: Diagnosis not present

## 2015-03-02 DIAGNOSIS — IMO0001 Reserved for inherently not codable concepts without codable children: Secondary | ICD-10-CM

## 2015-03-02 DIAGNOSIS — Z0000 Encounter for general adult medical examination without abnormal findings: Secondary | ICD-10-CM | POA: Diagnosis not present

## 2015-03-02 DIAGNOSIS — D518 Other vitamin B12 deficiency anemias: Secondary | ICD-10-CM

## 2015-03-02 DIAGNOSIS — E785 Hyperlipidemia, unspecified: Secondary | ICD-10-CM

## 2015-03-02 DIAGNOSIS — M159 Polyosteoarthritis, unspecified: Secondary | ICD-10-CM

## 2015-03-02 DIAGNOSIS — M150 Primary generalized (osteo)arthritis: Secondary | ICD-10-CM

## 2015-03-02 DIAGNOSIS — G4700 Insomnia, unspecified: Secondary | ICD-10-CM

## 2015-03-02 LAB — BASIC METABOLIC PANEL
BUN: 11 mg/dL (ref 6–23)
CO2: 24 meq/L (ref 19–32)
CREATININE: 0.55 mg/dL (ref 0.50–1.10)
Calcium: 9.7 mg/dL (ref 8.4–10.5)
Chloride: 102 mEq/L (ref 96–112)
GLUCOSE: 92 mg/dL (ref 70–99)
Potassium: 3.8 mEq/L (ref 3.5–5.3)
SODIUM: 138 meq/L (ref 135–145)

## 2015-03-02 NOTE — Progress Notes (Signed)
Subjective:    Patient ID: Mary Jordan, female    DOB: 07-Jul-1935, 80 y.o.   MRN: [id number]  DOS:  03/02/2015 Type of visit - description :  Here for Medicare AWV:  1. Risk factors based on Past M, S, F history: reviewed 2. Physical Activities:  swims x 3/week 3. Depression/mood: neg screening  4. Hearing:  No problems noted or reported  5. ADL's: independent, drives  6. Fall Risk: no recent falls, prevention discussed , see AVS 7. home Safety: does feel safe at home  8. Height, weight, & visual acuity: see VS, sees eye doctor regularly, s/p cataract surgery 9. Counseling: provided 10. Labs ordered based on risk factors: if needed  11. Referral Coordination: if needed 12. Care Plan, see assessment and plan , written personalized plan provided , see AVS 13. Cognitive Assessment: motor skills and cognition appropriate for age 72. Care team updated, Dr Joya Gaskins , Dr Barbaraann Barthel  15. End-of-life care discussed   In addition, today we discussed the following:  COPD, was recently diagnosed with pneumonia and feeling very sick, she is now finally improving. Insomnia: Well controlled with current medications. Takes Tranxene as needed High cholesterol: Discontinue cholesterol medication few weeks ago when she got pneumonia. B12 deficiency: Unclear to me if she is taking her shots monthly.     Review of Systems Constitutional: No fever. No chills. No unexplained wt changes. No unusual sweats  HEENT: No dental problems, no ear discharge, no facial swelling, no voice changes. No eye discharge, no eye  redness , no  intolerance to light   Respiratory:  Status post pneumonia, cough has resolved, no further mucus production, slowly getting back to normal, still feels a little more fatigued than usual.   Cardiovascular: No CP, no leg swelling , no  Palpitations  GI: no nausea, no vomiting, no diarrhea , no  abdominal pain.  No blood in the stools. No dysphagia, no odynophagia      Endocrine: No polyphagia, no polyuria , no polydipsia  GU: No dysuria, gross hematuria, difficulty urinating. No urinary urgency, no frequency.  Musculoskeletal: No joint swellings or unusual aches or pains  Skin: No change in the color of the skin, palor , no  Rash  Allergic, immunologic: No environmental allergies , no  food allergies  Neurological: No dizziness no  syncope. No headaches. No diplopia, no slurred, no slurred speech, no motor deficits, no facial  Numbness  Hematological: No enlarged lymph nodes, no easy bruising , no unusual bleedings  Psychiatry: No suicidal ideas, no hallucinations, no beavior problems, no confusion.  No unusual/severe anxiety, no depression   Past Medical History  Diagnosis Date  . Lung mass     s/p excision of RUL benign 2007  . COPD (chronic obstructive pulmonary disease)   . Osteoporosis   . Osteoarthritis   . Hyperlipidemia   . Anemia   . B12 deficiency   . Menopause   . Insomnia   . Allergic rhinitis     Past Surgical History  Procedure Laterality Date  . Abdominal hysterectomy    . Breast biopsy      remotely, neg  . Lesion from lung excised, benign  05-2006  . Cardiovascular stress test  2007    Cardiolite negative  . Cataract extraction, bilateral      History   Social History  . Marital Status: Divorced    Spouse Name: N/A  . Number of Children: 2  . Years of Education: N/A  Occupational History  . Retired     Network engineer   Social History Main Topics  . Smoking status: Former Smoker -- 1.00 packs/day for 39 years    Types: Cigarettes    Quit date: 08/11/1996  . Smokeless tobacco: Never Used     Comment:    . Alcohol Use: 3.6 oz/week    3 Cans of beer, 3 Glasses of wine per week     Comment: three times weekly  . Drug Use: No  . Sexual Activity: Not on file   Other Topics Concern  . Not on file   Social History Narrative   Lives by herself    Has 2 children, 4 gkids all boys             Family  History  Problem Relation Age of Onset  . Heart attack Father 74  . Hyperlipidemia Neg Hx   . Dementia Mother   . Breast cancer Mother   . Asthma Mother   . Diabetes Neg Hx   . Stroke Neg Hx   . Colon cancer Neg Hx        Medication List       This list is accurate as of: 03/02/15 11:59 PM.  Always use your most recent med list.               albuterol 108 (90 BASE) MCG/ACT inhaler  Commonly known as:  PROVENTIL HFA;VENTOLIN HFA  Inhale 1-2 puffs into the lungs every 6 (six) hours as needed for wheezing.     aspirin 81 MG tablet  Take 81 mg by mouth daily.     budesonide-formoterol 160-4.5 MCG/ACT inhaler  Commonly known as:  SYMBICORT  Inhale 2 puffs into the lungs 2 (two) times daily as needed.     clorazepate 3.75 MG tablet  Commonly known as:  TRANXENE  Take 1 tablet (3.75 mg total) by mouth at bedtime as needed for sleep.     diclofenac sodium 1 % Gel  Commonly known as:  VOLTAREN  Apply 4 g topically 4 (four) times daily.     simvastatin 10 MG tablet  Commonly known as:  ZOCOR  Take 10 mg by mouth daily.           Objective:   Physical Exam BP 126/78 mmHg  Pulse 87  Temp(Src) 98.4 F (36.9 C) (Oral)  Ht 5\' 4"  (1.626 m)  Wt 133 lb 2 oz (60.385 kg)  BMI 22.84 kg/m2  SpO2 98% General:   Well developed, well nourished . NAD.  HEENT:  Normocephalic . Face symmetric, atraumatic. Throat and tone are normal Lungs:  Decreased breath sounds Normal respiratory effort, no intercostal retractions, no accessory muscle use. Heart: RRR,  no murmur.  no pretibial edema bilaterally  Breast: no dominant mass, skin and nipples normal to inspection on palpation, axillary areas without mass or lymphadenopathy Abdomen:  Not distended, soft, non-tender. No rebound or rigidity.   Skin: Not pale. Not jaundice Neurologic:  alert & oriented X3.  Speech normal, gait appropriate for age and unassisted Psych--  Cognition and judgment appear intact.  Cooperative  with normal attention span and concentration.  Behavior appropriate. No anxious or depressed appearing.    Assessment & Plan:

## 2015-03-02 NOTE — Assessment & Plan Note (Signed)
Not taking Pravachol or aspirin because she was very sick with pneumonia. Recommend to restart both Come back fasting for months

## 2015-03-02 NOTE — Assessment & Plan Note (Signed)
Well-controlled with Tranxene, refill as needed, check a UDS

## 2015-03-02 NOTE — Assessment & Plan Note (Signed)
BP very good today, check a BMP

## 2015-03-02 NOTE — Assessment & Plan Note (Signed)
Last bone density test 07/2012, T score -2.2. Order a bone density test

## 2015-03-02 NOTE — Assessment & Plan Note (Signed)
Td 2008  pneumonia shot 2008, 2013  prevnar 2015 had the  shingles shot 2009   Female care: [facility] 11-2013  Neg  Breast exam today negative H/o hysterectomy, asx , no further screenings . see previous entries Colon ca screening Cscope 03-2008 Dr Olevia Perches 2 polyps (Hyperplastic and tubular adenoma) , colonoscopy again 11-15- 2013, 1 polyp. Next 2018  Diet, exercise  Discussed

## 2015-03-02 NOTE — Patient Instructions (Signed)
Get your blood work before you leave    Please consider visit these websites for more information:  www.begintheconversation.org  theconversationproject.org    Fall Prevention and Home Safety Falls cause injuries and can affect all age groups. It is possible to use preventive measures to significantly decrease the likelihood of falls. There are many simple measures which can make your home safer and prevent falls. OUTDOORS  Repair cracks and edges of walkways and driveways.  Remove high doorway thresholds.  Trim shrubbery on the main path into your home.  Have good outside lighting.  Clear walkways of tools, rocks, debris, and clutter.  Check that handrails are not broken and are securely fastened. Both sides of steps should have handrails.  Have leaves, snow, and ice cleared regularly.  Use sand or salt on walkways during winter months.  In the garage, clean up grease or oil spills. BATHROOM  Install night lights.  Install grab bars by the toilet and in the tub and shower.  Use non-skid mats or decals in the tub or shower.  Place a plastic non-slip stool in the shower to sit on, if needed.  Keep floors dry and clean up all water on the floor immediately.  Remove soap buildup in the tub or shower on a regular basis.  Secure bath mats with non-slip, double-sided rug tape.  Remove throw rugs and tripping hazards from the floors. BEDROOMS  Install night lights.  Make sure a bedside light is easy to reach.  Do not use oversized bedding.  Keep a telephone by your bedside.  Have a firm chair with side arms to use for getting dressed.  Remove throw rugs and tripping hazards from the floor. KITCHEN  Keep handles on pots and pans turned toward the center of the stove. Use back burners when possible.  Clean up spills quickly and allow time for drying.  Avoid walking on wet floors.  Avoid hot utensils and knives.  Position shelves so they are not too high  or low.  Place commonly used objects within easy reach.  If necessary, use a sturdy step stool with a grab bar when reaching.  Keep electrical cables out of the way.  Do not use floor polish or wax that makes floors slippery. If you must use wax, use non-skid floor wax.  Remove throw rugs and tripping hazards from the floor. STAIRWAYS  Never leave objects on stairs.  Place handrails on both sides of stairways and use them. Fix any loose handrails. Make sure handrails on both sides of the stairways are as long as the stairs.  Check carpeting to make sure it is firmly attached along stairs. Make repairs to worn or loose carpet promptly.  Avoid placing throw rugs at the top or bottom of stairways, or properly secure the rug with carpet tape to prevent slippage. Get rid of throw rugs, if possible.  Have an electrician put in a light switch at the top and bottom of the stairs. OTHER FALL PREVENTION TIPS  Wear low-heel or rubber-soled shoes that are supportive and fit well. Wear closed toe shoes.  When using a stepladder, make sure it is fully opened and both spreaders are firmly locked. Do not climb a closed stepladder.  Add color or contrast paint or tape to grab bars and handrails in your home. Place contrasting color strips on first and last steps.  Learn and use mobility aids as needed. Install an electrical emergency response system.  Turn on lights to avoid dark areas.   Replace light bulbs that burn out immediately. Get light switches that glow.  Arrange furniture to create clear pathways. Keep furniture in the same place.  Firmly attach carpet with non-skid or double-sided tape.  Eliminate uneven floor surfaces.  Select a carpet pattern that does not visually hide the edge of steps.  Be aware of all pets. OTHER HOME SAFETY TIPS  Set the water temperature for 120 F (48.8 C).  Keep emergency numbers on or near the telephone.  Keep smoke detectors on every level of  the home and near sleeping areas. Document Released: 07/18/2002 Document Revised: 01/27/2012 Document Reviewed: 10/17/2011 ExitCare Patient Information 2015 [employer]. This information is not intended to replace advice given to you by your health care provider. Make sure you discuss any questions you have with your health care provider.   Preventive Care for Adults Ages 65 and over  Blood pressure check.** / Every 1 to 2 years.  Lipid and cholesterol check.**/ Every 5 years beginning at age 20.  Lung cancer screening. / Every year if you are aged 55-80 years and have a 30-pack-year history of smoking and currently smoke or have quit within the past 15 years. Yearly screening is stopped once you have quit smoking for at least 15 years or develop a health problem that would prevent you from having lung cancer treatment.  Fecal occult blood test (FOBT) of stool. / Every year beginning at age 50 and continuing until age 75. You may not have to do this test if you get a colonoscopy every 10 years.  Flexible sigmoidoscopy** or colonoscopy.** / Every 5 years for a flexible sigmoidoscopy or every 10 years for a colonoscopy beginning at age 50 and continuing until age 75.  Hepatitis C blood test.** / For all people born from 1945 through 1965 and any individual with known risks for hepatitis C.  Abdominal aortic aneurysm (AAA) screening.** / A one-time screening for ages 65 to 75 years who are current or former smokers.  Skin self-exam. / Monthly.  Influenza vaccine. / Every year.  Tetanus, diphtheria, and acellular pertussis (Tdap/Td) vaccine.** / 1 dose of Td every 10 years.  Varicella vaccine.** / Consult your health care provider.  Zoster vaccine.** / 1 dose for adults aged 60 years or older.  Pneumococcal 13-valent conjugate (PCV13) vaccine.** / Consult your health care provider.  Pneumococcal polysaccharide (PPSV23) vaccine.** / 1 dose for all adults aged 65 years and  older.  Meningococcal vaccine.** / Consult your health care provider.  Hepatitis A vaccine.** / Consult your health care provider.  Hepatitis B vaccine.** / Consult your health care provider.  Haemophilus influenzae type b (Hib) vaccine.** / Consult your health care provider. **Family history and personal history of risk and conditions may change your health care provider's recommendations. Document Released: 09/23/2001 Document Revised: 08/02/2013 Document Reviewed: 12/23/2010 ExitCare Patient Information 2015 [employer]. This information is not intended to replace advice given to you by your health care provider. Make sure you discuss any questions you have with your health care provider.   

## 2015-03-02 NOTE — Assessment & Plan Note (Addendum)
Takes OTC NSAIDs, recommend to avoid if possible or at least minimize the use of Advil. Tylenol okay

## 2015-03-02 NOTE — Progress Notes (Signed)
Pre visit review using our clinic review tool, if applicable. No additional management support is needed unless otherwise documented below in the visit note. 

## 2015-03-02 NOTE — Assessment & Plan Note (Signed)
Apparently not taking her shots regularly, recommend to do so, check levels.

## 2015-03-03 LAB — VITAMIN B12: VITAMIN B 12: 1034 pg/mL — AB (ref 211–911)

## 2015-03-06 ENCOUNTER — Ambulatory Visit (HOSPITAL_BASED_OUTPATIENT_CLINIC_OR_DEPARTMENT_OTHER)
Admission: RE | Admit: 2015-03-06 | Discharge: 2015-03-06 | Disposition: A | Discharge status: Home / Self Care | Payer: Medicare Other | Source: Ambulatory Visit | Attending: Internal Medicine | Admitting: Internal Medicine

## 2015-03-06 DIAGNOSIS — Z1231 Encounter for screening mammogram for malignant neoplasm of breast: Secondary | ICD-10-CM | POA: Diagnosis not present

## 2015-03-13 ENCOUNTER — Ambulatory Visit (HOSPITAL_BASED_OUTPATIENT_CLINIC_OR_DEPARTMENT_OTHER)
Admission: RE | Admit: 2015-03-13 | Discharge: 2015-03-13 | Disposition: A | Discharge status: Home / Self Care | Payer: Medicare Other | Source: Ambulatory Visit | Attending: Internal Medicine | Admitting: Internal Medicine

## 2015-03-13 DIAGNOSIS — M810 Age-related osteoporosis without current pathological fracture: Secondary | ICD-10-CM

## 2015-03-13 DIAGNOSIS — M8580 Other specified disorders of bone density and structure, unspecified site: Secondary | ICD-10-CM | POA: Insufficient documentation

## 2015-03-13 DIAGNOSIS — M85852 Other specified disorders of bone density and structure, left thigh: Secondary | ICD-10-CM | POA: Diagnosis not present

## 2015-03-15 ENCOUNTER — Telehealth: Payer: Self-pay

## 2015-03-15 NOTE — Telephone Encounter (Signed)
UDS: 03/02/2015  Negative for Tranxene    Moderate risk per Dr. Larose Kells 03/15/2015

## 2015-05-09 ENCOUNTER — Telehealth: Payer: Self-pay | Admitting: Adult Health

## 2015-05-09 NOTE — Telephone Encounter (Signed)
Received message from Santiago Glad at [facility] office stating that patient called to speak to TP Attempted to contact patient, line busy. WCB

## 2015-05-14 NOTE — Telephone Encounter (Signed)
Patient says that she received a letter from Dr. Joya Gaskins that had 3 different telephone numbers on it.  She said that she called all of those numbers and couldn't reach our office.  She said one of the numbers was to Dr. Myrtice Lauth office.  She said she finally was able to contact our [facility] office to schedule her follow up appointment.  She was only calling to get a follow up appointment, did not need to speak to TP. Nothing further needed.

## 2015-05-21 ENCOUNTER — Other Ambulatory Visit: Payer: Self-pay | Admitting: Internal Medicine

## 2015-05-22 ENCOUNTER — Telehealth: Payer: Self-pay | Admitting: Adult Health

## 2015-05-22 MED ORDER — BUDESONIDE-FORMOTEROL FUMARATE 160-4.5 MCG/ACT IN AERO: 2.0000 | INHALATION_SPRAY | Freq: Two times a day (BID) | RESPIRATORY_TRACT | Status: DC

## 2015-05-22 NOTE — Telephone Encounter (Signed)
Called and spoke to pt. Pt requesting Symbicort 160 samples, advised pt we are currently out of Symbicort 160 samples. Pt requested Symbicort rx. Refill sent to preferred pharmacy. Pt verbalized understanding and denied any further questions or concerns at this time.

## 2015-05-31 ENCOUNTER — Encounter: Payer: Self-pay | Admitting: Adult Health

## 2015-05-31 ENCOUNTER — Ambulatory Visit (INDEPENDENT_AMBULATORY_CARE_PROVIDER_SITE_OTHER): Payer: Medicare Other | Admitting: Adult Health

## 2015-05-31 VITALS — BP 138/82 | HR 82 | Ht 63.5 in | Wt 137.0 lb

## 2015-05-31 DIAGNOSIS — J449 Chronic obstructive pulmonary disease, unspecified: Secondary | ICD-10-CM | POA: Insufficient documentation

## 2015-05-31 DIAGNOSIS — Z23 Encounter for immunization: Secondary | ICD-10-CM

## 2015-05-31 MED ORDER — BUDESONIDE-FORMOTEROL FUMARATE 160-4.5 MCG/ACT IN AERO: 2.0000 | INHALATION_SPRAY | Freq: Two times a day (BID) | RESPIRATORY_TRACT | Status: DC

## 2015-05-31 MED ORDER — BUDESONIDE-FORMOTEROL FUMARATE 80-4.5 MCG/ACT IN AERO: 2.0000 | INHALATION_SPRAY | Freq: Two times a day (BID) | RESPIRATORY_TRACT | Status: DC

## 2015-05-31 NOTE — Assessment & Plan Note (Signed)
Compensated on present regimen   Plan  Continue on current regimen  Flu shot today .  Follow up in 6 months with Dr. Hubbard Hartshorn

## 2015-05-31 NOTE — Progress Notes (Signed)
   Subjective:    Patient ID: Mary Jordan, female    DOB: Jul 28, 1935, 80 y.o.   MRN: [id number]  80 yo female former smoker  With COPD -GOLD C   05/31/2015 Follow up COPD  Returns for 3 month follow up .  She is feeling well with no flare of cough or dyspnea  She remains active . Does water exercises at [facility] .  Family is staying with her right now due to [employer].  Wants flu shot today . Remains on Symbicort Twice daily   No hemoptysis , chest pain,orthopnea, edema or fever.  No n/v/d or blood stools.  Prevnar and Pneumovax are utd.     Review of Systems Constitutional:   No  weight loss, night sweats,  Fevers, chills, fatigue, lassitude. HEENT:   No headaches,  Difficulty swallowing,  Tooth/dental problems,  Sore throat,                No sneezing, itching, ear ache, nasal congestion, post nasal drip,   CV:  No chest pain,  Orthopnea, PND, swelling in lower extremities, anasarca, dizziness, palpitations  GI  No heartburn, indigestion, abdominal pain, nausea, vomiting, diarrhea, change in bowel habits, loss of appetite  Resp: Notes shortness of breath with exertion or at rest.   .  No wheezing.  No chest wall deformity  Skin: no rash or lesions.  GU: no dysuria, change in color of urine, no urgency or frequency.  No flank pain.  MS:  No joint pain or swelling.  No decreased range of motion.  No back pain.  Psych:  No change in mood or affect. No depression or anxiety.  No memory loss.     Objective:   Physical Exam  Gen: Pleasant, elderly and frail in no distress,  normal affect   ENT: No lesions,  mouth clear,  oropharynx clear, no postnasal drip  Neck: No JVD, no TMG, no carotid bruits  Lungs: No use of accessory muscles, no dullness to percussion, distant breath sounds  Cardiovascular: RRR, heart sounds normal, no murmur or gallops, no peripheral edema  Abdomen: soft and NT, no HSM,  BS normal  Musculoskeletal: No deformities, no cyanosis or  clubbing  Neuro: alert, non focal  Skin: Warm, no lesions or rashes.assesspw   CXR 01/18/2015  Resolved pNA  Chronic changes          Assessment & Plan:

## 2015-05-31 NOTE — Progress Notes (Signed)
Reviewed & agree with plan  

## 2015-05-31 NOTE — Addendum Note (Signed)
Addended by: Mathis Dad on: 05/31/2015 10:22 AM   Modules accepted: Orders, Medications

## 2015-05-31 NOTE — Patient Instructions (Signed)
Continue on current regimen  Flu shot today .  Follow up in 6 months with Dr. Hubbard Hartshorn

## 2015-06-28 ENCOUNTER — Encounter: Payer: Self-pay | Admitting: Internal Medicine

## 2015-06-28 ENCOUNTER — Other Ambulatory Visit: Payer: Self-pay

## 2015-06-28 ENCOUNTER — Ambulatory Visit (INDEPENDENT_AMBULATORY_CARE_PROVIDER_SITE_OTHER): Payer: Medicare Other | Admitting: Internal Medicine

## 2015-06-28 ENCOUNTER — Telehealth: Payer: Self-pay

## 2015-06-28 VITALS — BP 118/68 | HR 82 | Temp 97.8°F | Ht 64.0 in | Wt 133.0 lb

## 2015-06-28 DIAGNOSIS — E785 Hyperlipidemia, unspecified: Secondary | ICD-10-CM

## 2015-06-28 DIAGNOSIS — Z09 Encounter for follow-up examination after completed treatment for conditions other than malignant neoplasm: Secondary | ICD-10-CM

## 2015-06-28 DIAGNOSIS — J438 Other emphysema: Secondary | ICD-10-CM | POA: Diagnosis not present

## 2015-06-28 DIAGNOSIS — M810 Age-related osteoporosis without current pathological fracture: Secondary | ICD-10-CM | POA: Diagnosis not present

## 2015-06-28 DIAGNOSIS — G4700 Insomnia, unspecified: Secondary | ICD-10-CM | POA: Diagnosis not present

## 2015-06-28 LAB — ALT: ALT: 18 U/L (ref 0–35)

## 2015-06-28 LAB — BASIC METABOLIC PANEL
BUN: 11 mg/dL (ref 6–23)
CHLORIDE: 105 meq/L (ref 96–112)
CO2: 25 meq/L (ref 19–32)
Calcium: 9.3 mg/dL (ref 8.4–10.5)
Creatinine, Ser: 0.59 mg/dL (ref 0.40–1.20)
GFR: 104.72 mL/min (ref >60.00)
GLUCOSE: 94 mg/dL (ref 70–99)
POTASSIUM: 3.9 meq/L (ref 3.5–5.1)
SODIUM: 139 meq/L (ref 135–145)

## 2015-06-28 LAB — LIPID PANEL
CHOLESTEROL: 194 mg/dL (ref 0–200)
HDL: 61 mg/dL (ref >39.00)
LDL CALC: 117 mg/dL — AB (ref 0–99)
NonHDL: 133.29
TRIGLYCERIDES: 79 mg/dL (ref 0.0–149.0)
Total CHOL/HDL Ratio: 3
VLDL: 15.8 mg/dL (ref 0.0–40.0)

## 2015-06-28 LAB — AST: AST: 17 U/L (ref 0–37)

## 2015-06-28 NOTE — Progress Notes (Signed)
Subjective:    Patient ID: Mary Jordan, female    DOB: [birth year], 80 y.o.   MRN: [id number]  DOS:  06/28/2015 Type of visit - description : Routine checkup Interval history: Osteoporosis, had a bone density test in August, results discussed B12 deficiency: Gets B12 shots "sometimes" elsewhere Hyperlipidemia:  on simvastatin, fasting. Insomnia: Symptoms well-controlled with medication   Review of Systems  No chest pain, lower extremity edema or palpitations. Occasionally short of breath after doing yard work. History of COPD. No nausea, vomiting, diarrhea  Past Medical History  Diagnosis Date  . Lung mass     s/p excision of RUL benign 2007  . COPD (chronic obstructive pulmonary disease) ([place])   . Osteoporosis   . Osteoarthritis   . Hyperlipidemia   . Anemia   . B12 deficiency   . Menopause   . Insomnia   . Allergic rhinitis     Past Surgical History  Procedure Laterality Date  . Abdominal hysterectomy    . Breast biopsy      remotely, neg  . Lesion from lung excised, benign  05-2006  . Cardiovascular stress test  2007    Cardiolite negative  . Cataract extraction, bilateral      Social History   Social History  . Marital Status: Divorced    Spouse Name: N/A  . Number of Children: 2  . Years of Education: N/A   Occupational History  . Retired     Network engineer   Social History Main Topics  . Smoking status: Former Smoker -- 1.00 packs/day for 39 years    Types: Cigarettes    Quit date: 08/11/1996  . Smokeless tobacco: Never Used     Comment:    . Alcohol Use: 3.6 oz/week    3 Cans of beer, 3 Glasses of wine per week     Comment: three times weekly  . Drug Use: No  . Sexual Activity: Not on file   Other Topics Concern  . Not on file   Social History Narrative   Lives by herself    Has 2 children, 4 gkids all boys                Medication List       This list is accurate as of: 06/28/15 11:59 PM.  Always use your most recent med  list.               albuterol 108 (90 BASE) MCG/ACT inhaler  Commonly known as:  PROVENTIL HFA;VENTOLIN HFA  Inhale 1-2 puffs into the lungs every 6 (six) hours as needed for wheezing.     aspirin 81 MG tablet  Take 81 mg by mouth daily.     budesonide-formoterol 160-4.5 MCG/ACT inhaler  Commonly known as:  SYMBICORT  Inhale 2 puffs into the lungs 2 (two) times daily.     clorazepate 3.75 MG tablet  Commonly known as:  TRANXENE  Take 1 tablet (3.75 mg total) by mouth at bedtime as needed for sleep.     diclofenac sodium 1 % Gel  Commonly known as:  VOLTAREN  Apply 4 g topically 4 (four) times daily.     simvastatin 10 MG tablet  Commonly known as:  ZOCOR  Take 10 mg by mouth daily.           Objective:   Physical Exam BP 118/68 mmHg  Pulse 82  Temp(Src) 97.8 F (36.6 C) (Oral)  Ht 5\' 4"  (1.626 m)  Wt  133 lb (60.328 kg)  BMI 22.82 kg/m2  SpO2 97% General:   Well developed, well nourished . NAD.  HEENT:  Normocephalic . Face symmetric, atraumatic Lungs:  decreased breath sounds Normal respiratory effort, no intercostal retractions, no accessory muscle use. Heart: RRR,  no murmur.  No pretibial edema bilaterally  Skin: Not pale. Not jaundice Neurologic:  alert & oriented X3.  Speech normal, gait appropriate for age and unassisted Psych--  Cognition and judgment appear intact.  Cooperative with normal attention span and concentration.  Behavior appropriate. No anxious or depressed appearing.      Assessment & Plan:   Assessment> Hyperlipidemia Insomnia: Chronic, mild COPD B12 deficiency- B12 shots elsewhere Osteoporosis : h/o wrist Fx, T score -2.2  (2013), -2.1 (03-2015) Intolerant to fosamax, actonel $$, took reclast before ~2013 DJD Menopausal Lung mass RUL, s/p excision benign 2007  PLAN Hyperlipidemia: His last visit restarted statins, on simvastatin, check FLP, AST, ALT. Addendum: Patient takes only half simvastatin  Insomnia: last  UDS  03-2015, RF PRN Osteoporosis: Score -1.2 on August 2016, history of high risk factor, different modalities discussed, she would like to restart Reclast. Check a BMP on preparation of Reclast COPD: Up-to-date on immunizations. RTC 4 -5 months

## 2015-06-28 NOTE — Patient Instructions (Signed)
Get your blood work before you leave   Will set up a Reclast injection    Next visit  for a  routine checkup in 4 months, nonfasting  (15 minutes) Please schedule an appointment at the front desk

## 2015-06-28 NOTE — Progress Notes (Signed)
Pre visit review using our clinic review tool, if applicable. No additional management support is needed unless otherwise documented below in the visit note. 

## 2015-06-28 NOTE — Telephone Encounter (Signed)
Pt interested in restarting Reclast, last was 12/14/2013. Reclast orders completed and faxed to [facility] for scheduling. BMP completed at office today and resulted. Received fax confirmation on 06/28/2015 at 1258. Physician order form sent for scanning.

## 2015-06-30 DIAGNOSIS — Z09 Encounter for follow-up examination after completed treatment for conditions other than malignant neoplasm: Secondary | ICD-10-CM | POA: Insufficient documentation

## 2015-06-30 NOTE — Assessment & Plan Note (Addendum)
Hyperlipidemia: His last visit restarted statins, on simvastatin, check FLP, AST, ALT. Addendum: Patient takes only half simvastatin  Insomnia: last  UDS 03-2015, RF PRN Osteoporosis: Score -1.2 on August 2016, history of high risk factor, different modalities discussed, she would like to restart Reclast. Check a BMP on preparation of Reclast COPD: Up-to-date on immunizations. RTC 4 -5 months

## 2015-07-02 NOTE — Telephone Encounter (Signed)
Pt has Reclast scheduled for 07/24/2015 at 1400.

## 2015-07-16 DIAGNOSIS — L988 Other specified disorders of the skin and subcutaneous tissue: Secondary | ICD-10-CM | POA: Diagnosis not present

## 2015-07-16 DIAGNOSIS — L918 Other hypertrophic disorders of the skin: Secondary | ICD-10-CM | POA: Diagnosis not present

## 2015-07-16 DIAGNOSIS — L820 Inflamed seborrheic keratosis: Secondary | ICD-10-CM | POA: Diagnosis not present

## 2015-07-24 ENCOUNTER — Encounter (HOSPITAL_COMMUNITY): Payer: Self-pay

## 2015-07-24 ENCOUNTER — Ambulatory Visit (HOSPITAL_COMMUNITY)
Admission: RE | Admit: 2015-07-24 | Discharge: 2015-07-24 | Disposition: A | Discharge status: Home / Self Care | Payer: Medicare Other | Source: Ambulatory Visit | Attending: Internal Medicine | Admitting: Internal Medicine

## 2015-07-24 DIAGNOSIS — M810 Age-related osteoporosis without current pathological fracture: Secondary | ICD-10-CM | POA: Insufficient documentation

## 2015-07-24 MED ORDER — SODIUM CHLORIDE 0.9 % IV SOLN
Freq: Once | INTRAVENOUS | Status: AC
  Administered 2015-07-24: 14:00:00 via INTRAVENOUS

## 2015-07-24 MED ORDER — ZOLEDRONIC ACID 5 MG/100ML IV SOLN
5.0000 mg | Freq: Once | INTRAVENOUS | Status: AC
Start: 2015-07-24 — End: 2015-07-24
  Administered 2015-07-24: 5 mg via INTRAVENOUS
  Filled 2015-07-24: qty 100

## 2015-07-24 NOTE — Discharge Instructions (Signed)

## 2015-07-26 ENCOUNTER — Encounter: Payer: Self-pay | Admitting: Medical

## 2015-07-26 ENCOUNTER — Ambulatory Visit (INDEPENDENT_AMBULATORY_CARE_PROVIDER_SITE_OTHER): Payer: Medicare Other | Admitting: Medical

## 2015-07-26 VITALS — BP 126/80 | HR 90 | Temp 98.0°F | Ht 64.0 in | Wt 136.4 lb

## 2015-07-26 DIAGNOSIS — J309 Allergic rhinitis, unspecified: Secondary | ICD-10-CM

## 2015-07-26 MED ORDER — FLUTICASONE PROPIONATE 50 MCG/ACT NA SUSP: 2.0000 | Freq: Every day | NASAL | Status: DC

## 2015-07-26 MED ORDER — MONTELUKAST SODIUM 10 MG PO TABS: 10.0000 mg | ORAL_TABLET | Freq: Every day | ORAL | Status: DC

## 2015-07-26 MED ORDER — LORATADINE 10 MG PO TABS: 10.0000 mg | ORAL_TABLET | Freq: Every day | ORAL | Status: DC

## 2015-07-26 NOTE — Patient Instructions (Signed)
I think your symptoms are allergic type. Will rx claritin and flonase. Try these first. You could also add montelukast if needed.  I don't see any infectious signs or symptoms presently. If your symptoms change or worsen let us know.  Follow up in 7 days or as needed

## 2015-07-26 NOTE — Progress Notes (Signed)
Subjective:    Patient ID: Mary Jordan, female    DOB: 08-Sep-1934, 80 y.o.   MRN: [id number]  HPI   Pt in states in November she got st and some ear discomfort. She describes constant runny nose. Pt states was feeling pretty good. The 2 weeks ago raked leaves and her symptoms started again. Pt tried zyrtec, benadryl and allegra. Pt never used any nasal sprays. 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    Review of Systems  Constitutional: Negative for fever, chills and fatigue.  HENT: Negative for congestion, ear pain, postnasal drip, sneezing and sore throat.        No ear pressure today.  See hpi no symptoms today but some over past month.  Respiratory: Positive for cough. Negative for chest tightness, shortness of breath and wheezing.        Rare occasional faint transient cough.  Gastrointestinal: Negative for abdominal pain.  Musculoskeletal: Negative for back pain.  Neurological: Negative for headaches.  Hematological: Negative for adenopathy. Does not bruise/bleed easily.  Psychiatric/Behavioral: Negative for behavioral problems and confusion.   Past Medical History  Diagnosis Date  . Lung mass     s/p excision of RUL benign 2007  . COPD (chronic obstructive pulmonary disease) ([place])   . Osteoporosis   . Osteoarthritis   . Hyperlipidemia   . Anemia   . B12 deficiency   . Menopause   . Insomnia   . Allergic rhinitis     Social History   Social History  . Marital Status: Divorced    Spouse Name: N/A  . Number of Children: 2  . Years of Education: N/A   Occupational History  . Retired     Network engineer   Social History Main Topics  . Smoking status: Former Smoker -- 1.00 packs/day for 39 years    Types: Cigarettes    Quit date: 08/11/1996  . Smokeless tobacco: Never Used     Comment:    . Alcohol Use: 3.6 oz/week    3 Cans of beer, 3 Glasses of wine per week     Comment: three times weekly  . Drug Use:  No  . Sexual Activity: Not on file   Other Topics Concern  . Not on file   Social History Narrative   Lives by herself    Has 2 children, 4 gkids all boys            Past Surgical History  Procedure Laterality Date  . Abdominal hysterectomy    . Breast biopsy      remotely, neg  . Lesion from lung excised, benign  05-2006  . Cardiovascular stress test  2007    Cardiolite negative  . Cataract extraction, bilateral      Family History  Problem Relation Age of Onset  . Heart attack Father 81  . Hyperlipidemia Neg Hx   . Dementia Mother   . Breast cancer Mother   . Asthma Mother   . Diabetes Neg Hx   . Stroke Neg Hx   . Colon cancer Neg Hx     Allergies  Allergen Reactions  . Levofloxacin Hives    Current Outpatient Prescriptions on File Prior to Visit  Medication Sig Dispense Refill  . albuterol (PROVENTIL HFA;VENTOLIN HFA) 108 (90 BASE) MCG/ACT inhaler Inhale 1-2 puffs into the lungs every 6 (six) hours as needed for wheezing. 1 Inhaler 0  . aspirin  81 MG tablet Take 81 mg by mouth daily.    . budesonide-formoterol (SYMBICORT) 160-4.5 MCG/ACT inhaler Inhale 2 puffs into the lungs 2 (two) times daily. 1 Inhaler 6  . calcium-vitamin D (OSCAL WITH D) 500-200 MG-UNIT tablet Take 1 tablet by mouth.    . clorazepate (TRANXENE) 3.75 MG tablet Take 1 tablet (3.75 mg total) by mouth at bedtime as needed for sleep. 30 tablet 3  . diclofenac sodium (VOLTAREN) 1 % GEL Apply 4 g topically 4 (four) times daily. 5 Tube 1  . simvastatin (ZOCOR) 10 MG tablet Take 10 mg by mouth daily.     No current facility-administered medications on file prior to visit.    BP 126/80 mmHg  Pulse 90  Temp(Src) 98 F (36.7 C) (Oral)  Ht 5\' 4"  (1.626 m)  Wt 136 lb 6.4 oz (61.871 kg)  BMI 23.40 kg/m2  SpO2 96%       Objective:   Physical Exam  General  Mental Status - Alert. General Appearance - Well groomed. Not in acute distress.  Skin Rashes- No Rashes.  HEENT Head-  Normal. Ear Auditory Canal - Left- Normal. Right - Normal.Tympanic Membrane- Left- Normal. Right- Normal. Eye Sclera/Conjunctiva- Left- Normal. Right- Normal. Nose & Sinuses Nasal Mucosa- Left-  Boggy and Congested. Right-  Boggy and  Congested.Bilateral no maxillary and no  frontal sinus pressure. Mouth & Throat Lips: Upper Lip- Normal: no dryness, cracking, pallor, cyanosis, or vesicular eruption. Lower Lip-Normal: no dryness, cracking, pallor, cyanosis or vesicular eruption. Buccal Mucosa- Bilateral- No Aphthous ulcers. Oropharynx- No Discharge or Erythema. +pnd Tonsils: Characteristics- Bilateral- No Erythema or Congestion. Size/Enlargement- Bilateral- No enlargement. Discharge- bilateral-None.  Neck Neck- Supple. No Masses.   Chest and Lung Exam Auscultation: Breath Sounds:-Clear even and unlabored.  Cardiovascular Auscultation:Rythm- Regular, rate and rhythm. Murmurs & Other Heart Sounds:Ausculatation of the heart reveal- No Murmurs.  Lymphatic Head & Neck General Head & Neck Lymphatics: Bilateral: Description- No Localized lymphadenopathy.       Assessment & Plan:  I think your symptoms are allergic type. Will rx claritin and flonase. Try these first. You could also add montelukast if needed.  I don't see any infectious signs or symptoms presently. If your symptoms change or worsen let us know.  Follow up in 7 days or as needed

## 2015-07-26 NOTE — Progress Notes (Signed)
Pre visit review using our clinic review tool, if applicable. No additional management support is needed unless otherwise documented below in the visit note. 

## 2015-08-03 ENCOUNTER — Other Ambulatory Visit: Payer: Self-pay | Admitting: Internal Medicine

## 2015-08-03 NOTE — Telephone Encounter (Signed)
Rx printed, awaiting MD signature.  

## 2015-08-03 NOTE — Telephone Encounter (Signed)
Pt is requesting refill on Clorazepate.  Last OV: 06/28/2015  Last Fill: 11/23/2014 #30 and 3RF  Please advise.

## 2015-08-03 NOTE — Telephone Encounter (Signed)
Rx faxed to [facility].  

## 2015-08-03 NOTE — Telephone Encounter (Signed)
Okay #30 and one refill 

## 2015-10-12 ENCOUNTER — Other Ambulatory Visit: Payer: Self-pay | Admitting: Internal Medicine

## 2015-10-12 DIAGNOSIS — D4981 Neoplasm of unspecified behavior of retina and choroid: Secondary | ICD-10-CM | POA: Diagnosis not present

## 2015-10-12 DIAGNOSIS — H524 Presbyopia: Secondary | ICD-10-CM | POA: Diagnosis not present

## 2015-10-12 DIAGNOSIS — Z961 Presence of intraocular lens: Secondary | ICD-10-CM | POA: Diagnosis not present

## 2015-10-22 MED FILL — MONTELUKAST SOD 10 MG TAB: 10 | 30 days supply | Qty: 30 | Fill #1

## 2015-11-28 ENCOUNTER — Other Ambulatory Visit: Payer: Self-pay | Admitting: Medical

## 2015-11-28 MED FILL — LORATADINE 10 MG TABLET: 10 | 100 days supply | Qty: 100 | Fill #0

## 2015-11-29 ENCOUNTER — Encounter: Payer: Self-pay | Admitting: Pulmonary Disease

## 2015-11-29 ENCOUNTER — Ambulatory Visit (INDEPENDENT_AMBULATORY_CARE_PROVIDER_SITE_OTHER): Payer: Medicare Other | Admitting: Pulmonary Disease

## 2015-11-29 VITALS — BP 147/87 | HR 87 | Ht 63.5 in | Wt 133.0 lb

## 2015-11-29 DIAGNOSIS — IMO0001 Reserved for inherently not codable concepts without codable children: Secondary | ICD-10-CM

## 2015-11-29 DIAGNOSIS — R030 Elevated blood-pressure reading, without diagnosis of hypertension: Secondary | ICD-10-CM | POA: Diagnosis not present

## 2015-11-29 DIAGNOSIS — J449 Chronic obstructive pulmonary disease, unspecified: Secondary | ICD-10-CM | POA: Diagnosis not present

## 2015-11-29 DIAGNOSIS — J441 Chronic obstructive pulmonary disease with (acute) exacerbation: Secondary | ICD-10-CM | POA: Diagnosis not present

## 2015-11-29 MED ORDER — LORATADINE 10 MG PO TABS: ORAL_TABLET | ORAL | Status: DC

## 2015-11-29 NOTE — Assessment & Plan Note (Addendum)
Lung function is at 54% Refills on  Claritin Trial of stiolto (instead of symbicort) - call back for Rx if this works

## 2015-11-29 NOTE — Progress Notes (Signed)
   Subjective:    Patient ID: Mary Jordan, female    DOB: 1935-03-04, 81 y.o.   MRN: [id number]  HPI 81 yo female former smoker  With COPD -GOLD C  FeV1 57% 04/2012  11/29/2015  Chief Complaint  Patient presents with  . Follow-up    allergies, SOB; Symbicort samples/coupons.   46m FU Former [facility] pt  Increased sob due to allergies Wants samples of symbicort  Pna in 12/2014 & cough in dec & march She remains active . [facility] water exercises at [facility] .  Family is staying with her right now due to [employer].  Wants flu shot today . Remains on Symbicort Twice daily  - not sure if this is helping but seems to need it every night     CXR 12/2014 emphysema 11/2015 Spirometry fev1 54%   Review of Systems Patient denies significant dyspnea,cough, hemoptysis,  chest pain, palpitations, pedal edema, orthopnea, paroxysmal nocturnal dyspnea, lightheadedness, nausea, vomiting, abdominal or  leg pains      Objective:   Physical Exam  Gen. Pleasant, well-nourished, in no distress ENT - no lesions, no post nasal drip Neck: No JVD, no thyromegaly, no carotid bruits Lungs: no use of accessory muscles, no dullness to percussion, decreased without rales or rhonchi  Cardiovascular: Rhythm regular, heart sounds  normal, no murmurs or gallops, no peripheral edema Musculoskeletal: No deformities, no cyanosis or clubbing        Assessment & Plan:

## 2015-11-29 NOTE — Assessment & Plan Note (Signed)
?   White coat Recheck at home & FU with PCP

## 2015-11-29 NOTE — Patient Instructions (Addendum)
Lung function is at 54% Refills on  Claritin Trial of Anoro (instead of symbicort) - call back for Rx if this works

## 2015-12-17 ENCOUNTER — Other Ambulatory Visit: Payer: Self-pay | Admitting: Internal Medicine

## 2015-12-17 NOTE — Telephone Encounter (Signed)
Rx faxed to [facility].  

## 2015-12-17 NOTE — Telephone Encounter (Signed)
Pt is requesting refill on Tranxene.   Last OV: 06/28/2015 Last Fill: 08/03/2015 #30 and 0RF UDS: Not able to be tested for per Amy at [facility]   Please advise.

## 2015-12-17 NOTE — Telephone Encounter (Signed)
Okay #30, no refills. Due for a follow-up, be sure she has an appointment scheduled

## 2015-12-17 NOTE — Telephone Encounter (Signed)
Rx printed, awaiting MD signature.  

## 2015-12-19 ENCOUNTER — Telehealth: Payer: Self-pay | Admitting: Internal Medicine

## 2015-12-19 NOTE — Telephone Encounter (Signed)
Pt called in because she says that she just picked

## 2016-02-01 ENCOUNTER — Other Ambulatory Visit: Payer: Self-pay | Admitting: Internal Medicine

## 2016-02-05 ENCOUNTER — Telehealth: Payer: Self-pay | Admitting: Internal Medicine

## 2016-02-05 MED ORDER — CLORAZEPATE DIPOTASSIUM 3.75 MG PO TABS: 3.7500 mg | ORAL_TABLET | Freq: Every evening | ORAL | Status: DC | PRN

## 2016-02-05 NOTE — Telephone Encounter (Signed)
Rx faxed to [facility]. Please call Pt and schedule appt within next 30 days, no further refills w/o OV. Thank you.

## 2016-02-05 NOTE — Telephone Encounter (Signed)
Appointment scheduled.

## 2016-02-05 NOTE — Telephone Encounter (Signed)
°  Relationship to patient: Self Can be reached: [phone]  Pharmacy:  [facility], [place] - [address] [phone] (Phone) [phone] (Fax)         Reason for call: Refill on clorazepate (TRANXENE) 3.75 MG tablet

## 2016-02-05 NOTE — Telephone Encounter (Signed)
Advise patient, will Rx 30 only, needs office visit for further refills

## 2016-02-05 NOTE — Telephone Encounter (Signed)
Pt is requesting refill on Clorazepate.  Last OV: 06/28/2015, no future appt scheduled Last Fill: 12/17/2015 #30 and 0RF UDS: [facility] unable to check Clorazepate/Tranxene levels  Please advise.

## 2016-02-05 NOTE — Telephone Encounter (Signed)
Rx printed, awaiting MD signature.  

## 2016-02-25 ENCOUNTER — Encounter: Payer: Self-pay | Admitting: Internal Medicine

## 2016-02-25 ENCOUNTER — Ambulatory Visit (INDEPENDENT_AMBULATORY_CARE_PROVIDER_SITE_OTHER): Payer: Medicare Other | Admitting: Internal Medicine

## 2016-02-25 VITALS — BP 126/70 | HR 87 | Temp 97.6°F | Ht 64.0 in | Wt 133.0 lb

## 2016-02-25 DIAGNOSIS — G4700 Insomnia, unspecified: Secondary | ICD-10-CM

## 2016-02-25 DIAGNOSIS — E785 Hyperlipidemia, unspecified: Secondary | ICD-10-CM

## 2016-02-25 DIAGNOSIS — J449 Chronic obstructive pulmonary disease, unspecified: Secondary | ICD-10-CM

## 2016-02-25 DIAGNOSIS — M810 Age-related osteoporosis without current pathological fracture: Secondary | ICD-10-CM

## 2016-02-25 MED ORDER — ALBUTEROL SULFATE HFA 108 (90 BASE) MCG/ACT IN AERS: 1.0000 | INHALATION_SPRAY | Freq: Four times a day (QID) | RESPIRATORY_TRACT | Status: DC | PRN

## 2016-02-25 MED ORDER — CLORAZEPATE DIPOTASSIUM 3.75 MG PO TABS: 3.7500 mg | ORAL_TABLET | Freq: Every evening | ORAL | Status: DC | PRN

## 2016-02-25 NOTE — Progress Notes (Signed)
Pre visit review using our clinic review tool, if applicable. No additional management support is needed unless otherwise documented below in the visit note. 

## 2016-02-25 NOTE — Patient Instructions (Signed)
  GO TO THE FRONT DESK Schedule your next appointment for a  Physical exam in 4-5 months, fasting

## 2016-02-25 NOTE — Progress Notes (Signed)
Subjective:    Patient ID: Mary Jordan, female    DOB: 1935/04/10, 81 y.o.   MRN: [id number]  DOS:  02/25/2016 Type of visit - description : rov Interval history: Insomnia: Well-controlled, needs a refill. COPD: Symptoms controlled, request a refill on albuterol. Osteoporosis: Had a Reclast, no complications, does not like to proceed with further treatment.   Review of Systems  No nausea, vomiting, diarrhea No chest pain or lower extremity edema Breathing at baseline Past Medical History  Diagnosis Date  . Lung mass     s/p excision of RUL benign 2007  . COPD (chronic obstructive pulmonary disease) ([place])   . Osteoporosis   . Osteoarthritis   . Hyperlipidemia   . Anemia   . B12 deficiency   . Menopause   . Insomnia   . Allergic rhinitis     Past Surgical History  Procedure Laterality Date  . Abdominal hysterectomy    . Breast biopsy      remotely, neg  . Lesion from lung excised, benign  05-2006  . Cardiovascular stress test  2007    Cardiolite negative  . Cataract extraction, bilateral      Social History   Social History  . Marital Status: Divorced    Spouse Name: N/A  . Number of Children: 2  . Years of Education: N/A   Occupational History  . Retired     Network engineer   Social History Main Topics  . Smoking status: Former Smoker -- 1.00 packs/day for 39 years    Types: Cigarettes    Quit date: 08/11/1996  . Smokeless tobacco: Never Used     Comment:    . Alcohol Use: 3.6 oz/week    3 Cans of beer, 3 Glasses of wine per week     Comment: three times weekly  . Drug Use: No  . Sexual Activity: Not on file   Other Topics Concern  . Not on file   Social History Narrative   Lives by herself    Has 2 children, 4 gkids all boys                Medication List       This list is accurate as of: 02/25/16 11:59 PM.  Always use your most recent med list.               albuterol 108 (90 Base) MCG/ACT inhaler  Commonly known as:   PROVENTIL HFA;VENTOLIN HFA  Inhale 1-2 puffs into the lungs every 6 (six) hours as needed for wheezing.     budesonide-formoterol 160-4.5 MCG/ACT inhaler  Commonly known as:  SYMBICORT  Inhale 2 puffs into the lungs 2 (two) times daily.     clorazepate 3.75 MG tablet  Commonly known as:  TRANXENE  Take 1 tablet (3.75 mg total) by mouth at bedtime as needed for sleep.     diclofenac sodium 1 % Gel  Commonly known as:  VOLTAREN  Apply 4 g topically 4 (four) times daily.     loratadine 10 MG tablet  Commonly known as:  CLARITIN  TAKE 1 TABLET (10 MG TOTAL) BY MOUTH DAILY.     simvastatin 10 MG tablet  Commonly known as:  ZOCOR  Take 1 tablet (10 mg total) by mouth at bedtime.           Objective:   Physical Exam BP 126/70 mmHg  Pulse 87  Temp(Src) 97.6 F (36.4 C) (Oral)  Ht 5\' 4"  (1.626 m)  Wt 133 lb (60.328 kg)  BMI 22.82 kg/m2  SpO2 95% General:   Well developed, well nourished . NAD.  HEENT:  Normocephalic . Face symmetric, atraumatic Lungs:  Decreased breath sounds otherwise clear Normal respiratory effort, no intercostal retractions, no accessory muscle use. Heart: RRR,  no murmur.  No pretibial edema bilaterally  Skin: Not pale. Not jaundice Neurologic:  alert & oriented X3.  Speech normal, gait appropriate for age and unassisted Psych--  Cognition and judgment appear intact.  Cooperative with normal attention span and concentration.  Behavior appropriate. No anxious or depressed appearing.      Assessment & Plan:   Assessment> Hyperlipidemia Insomnia: Chronic, mild COPD B12 deficiency- B12 shots elsewhere Osteoporosis : h/o wrist Fx, T score -2.2  (2013), -2.1 (03-2015) Intolerant to fosamax, actonel $$, took reclast before ~2013 DJD Menopausal Lung mass RUL, s/p excision benign 2007  PLAN Hyperlipidemia: Relatively well controlled, no change. Insomnia: Refill medications. COPD: Stable, needs a refill for albuterol, sent. Osteoporosis:  Had a Reclast infusion 07-2015, states not interested on further treatments. Will discuss on RTC RTC 4-5 months, CPX, fasting

## 2016-02-26 ENCOUNTER — Other Ambulatory Visit: Payer: Self-pay | Admitting: Internal Medicine

## 2016-02-26 DIAGNOSIS — Z1231 Encounter for screening mammogram for malignant neoplasm of breast: Secondary | ICD-10-CM

## 2016-02-26 NOTE — Assessment & Plan Note (Signed)
Hyperlipidemia: Relatively well controlled, no change. Insomnia: Refill medications. COPD: Stable, needs a refill for albuterol, sent. Osteoporosis: Had a Reclast infusion 07-2015, states not interested on further treatments. Will discuss on RTC RTC 4-5 months, CPX, fasting

## 2016-03-06 ENCOUNTER — Ambulatory Visit (HOSPITAL_BASED_OUTPATIENT_CLINIC_OR_DEPARTMENT_OTHER): Payer: Medicare Other

## 2016-03-11 ENCOUNTER — Telehealth: Payer: Self-pay | Admitting: Family Medicine

## 2016-03-11 NOTE — Telephone Encounter (Signed)
That's unusual.  We don't have a refill request in the chart for her.  But we would have to see her for an appointment if she still needs this - it's been over a year since we've seen and evaluated her.

## 2016-03-17 ENCOUNTER — Ambulatory Visit (INDEPENDENT_AMBULATORY_CARE_PROVIDER_SITE_OTHER): Payer: Medicare Other | Admitting: Family Medicine

## 2016-03-17 ENCOUNTER — Encounter: Payer: Self-pay | Admitting: Family Medicine

## 2016-03-17 ENCOUNTER — Encounter (INDEPENDENT_AMBULATORY_CARE_PROVIDER_SITE_OTHER): Payer: Self-pay

## 2016-03-17 DIAGNOSIS — M170 Bilateral primary osteoarthritis of knee: Secondary | ICD-10-CM

## 2016-03-17 MED ORDER — DICLOFENAC SODIUM 1 % TD GEL: 4.0000 g | Freq: Four times a day (QID) | TRANSDERMAL | 1 refills | Status: DC

## 2016-03-17 NOTE — Patient Instructions (Signed)
Your pain is due to mild arthritis. These are the different classes of medicine you can use for this: Tylenol 500mg  1-2 tabs three times a day for pain. Voltaren gel up to 4 times a day Glucosamine sulfate 750mg  twice a day is a supplement that may help. Capsaicin, aspercreme, or biofreeze topically up to four times a day may also help with pain. Cortisone injections are an option. If cortisone injections do not help, there are different types of shots (gel shots) that may help but they take longer to take effect. It's important that you continue to stay active. Straight leg raises, knee extensions 3 sets of 10 once a day (add ankle weight if these become too easy). Consider physical therapy to strengthen muscles around the joint that hurts to take pressure off of the joint itself. Shoe inserts with good arch support may be helpful. Heat or ice 15 minutes at a time 3-4 times a day as needed to help with pain. Water aerobics and cycling with low resistance are the best two types of exercise for arthritis. Follow up with me as needed.

## 2016-03-18 NOTE — Assessment & Plan Note (Signed)
left knee doing well from probable medial meniscus tear.  Likely with underlying mild DJD causing current pain.  Discussed tylenol, voltaren gel (given rx), glucosamine.  Shown home exercises to do daily.  Ice/heat if needed.  F/u prn.

## 2016-03-18 NOTE — Progress Notes (Signed)
Patient ID: Mary Jordan, female   DOB: [birth year], 81 y.o.   MRN: [id number]  PCP: Kathlene November, MD  Subjective:   HPI: Patient is a 81 y.o. female here for left knee injury.  05/24/14: Patient reports on 10/6 her knee gave out on her without any incident. Almost fell down as a result but did not. Associated swelling, warmth. Has been icing, using bengay, ibuprofen 800, heat. No catching, locking.  03/17/16: Patient reports she's overall doing well. Having no pain currently. Had some pain in right knee, doing well in left knee. Pain is medial when this comes on. Doing water aerobics when right started bothering. Questionable swelling. Voltaren gel helps but needs new script for this. No skin changes, numbness.  Past Medical History:  Diagnosis Date  . Allergic rhinitis   . Anemia   . B12 deficiency   . COPD (chronic obstructive pulmonary disease) ([place])   . Hyperlipidemia   . Insomnia   . Lung mass    s/p excision of RUL benign 2007  . Menopause   . Osteoarthritis   . Osteoporosis     Current Outpatient Prescriptions on File Prior to Visit  Medication Sig Dispense Refill  . albuterol (PROVENTIL HFA;VENTOLIN HFA) 108 (90 Base) MCG/ACT inhaler Inhale 1-2 puffs into the lungs every 6 (six) hours as needed for wheezing. 18 g 5  . budesonide-formoterol (SYMBICORT) 160-4.5 MCG/ACT inhaler Inhale 2 puffs into the lungs 2 (two) times daily. 1 Inhaler 6  . clorazepate (TRANXENE) 3.75 MG tablet Take 1 tablet (3.75 mg total) by mouth at bedtime as needed for sleep. 30 tablet 4  . loratadine (CLARITIN) 10 MG tablet TAKE 1 TABLET (10 MG TOTAL) BY MOUTH DAILY. (Patient not taking: Reported on 02/25/2016) 100 tablet 0  . simvastatin (ZOCOR) 10 MG tablet Take 1 tablet (10 mg total) by mouth at bedtime. 30 tablet 3   No current facility-administered medications on file prior to visit.     Past Surgical History:  Procedure Laterality Date  . ABDOMINAL HYSTERECTOMY    . BREAST BIOPSY      remotely, neg  . CARDIOVASCULAR STRESS TEST  2007   Cardiolite negative  . CATARACT EXTRACTION, BILATERAL    . lesion from lung excised, benign  05-2006    Allergies  Allergen Reactions  . Levofloxacin Hives    Social History   Social History  . Marital status: Divorced    Spouse name: N/A  . Number of children: 2  . Years of education: N/A   Occupational History  . Retired Retired    Network engineer   Social History Main Topics  . Smoking status: Former Smoker    Packs/day: 1.00    Years: 39.00    Types: Cigarettes    Quit date: 08/11/1996  . Smokeless tobacco: Never Used     Comment:    . Alcohol use 3.6 oz/week    3 Cans of beer, 3 Glasses of wine per week     Comment: three times weekly  . Drug use: No  . Sexual activity: Not on file   Other Topics Concern  . Not on file   Social History Narrative   Lives by herself    Has 2 children, 4 gkids all boys            Family History  Problem Relation Age of Onset  . Heart attack Father 79  . Hyperlipidemia Neg Hx   . Dementia Mother   . Breast cancer Mother   .  Asthma Mother   . Diabetes Neg Hx   . Stroke Neg Hx   . Colon cancer Neg Hx     BP (!) 132/94   Pulse 91   Ht 5\' 4"  (1.626 m)   Wt 130 lb (59 kg)   BMI 22.31 kg/m   Review of Systems: See HPI above.    Objective:  Physical Exam:  Gen: NAD  Right knee: Minimal effusion.  No bruising, other deformity. Minimal TTP medial joint line.  No other tenderness. FROM. Negative ant/post drawers. Negative valgus/varus testing. Negative lachmanns. Negative mcmurrays and apleys.  Negative patellar apprehension. NV intact distally.  Left knee: No gross deformity, ecchymoses, swelling. No TTP. FROM. Negative ant/post drawers. Negative valgus/varus testing. Negative lachmanns. Negative mcmurrays, apleys, patellar apprehension. NV intact distally.  Assessment & Plan:  1. Bilateral knee pain - left knee doing well from probable medial meniscus  tear.  Likely with underlying mild DJD causing current pain.  Discussed tylenol, voltaren gel (given rx), glucosamine.  Shown home exercises to do daily.  Ice/heat if needed.  F/u prn.

## 2016-03-20 ENCOUNTER — Ambulatory Visit (INDEPENDENT_AMBULATORY_CARE_PROVIDER_SITE_OTHER): Payer: Medicare Other | Admitting: Adult Health

## 2016-03-20 ENCOUNTER — Other Ambulatory Visit (INDEPENDENT_AMBULATORY_CARE_PROVIDER_SITE_OTHER): Payer: Medicare Other

## 2016-03-20 ENCOUNTER — Encounter: Payer: Self-pay | Admitting: Adult Health

## 2016-03-20 ENCOUNTER — Ambulatory Visit (HOSPITAL_BASED_OUTPATIENT_CLINIC_OR_DEPARTMENT_OTHER)
Admission: RE | Admit: 2016-03-20 | Discharge: 2016-03-20 | Disposition: A | Discharge status: Home / Self Care | Payer: Medicare Other | Source: Ambulatory Visit | Attending: Adult Health | Admitting: Adult Health

## 2016-03-20 ENCOUNTER — Telehealth: Payer: Self-pay | Admitting: Emergency Medicine

## 2016-03-20 VITALS — BP 132/86 | HR 91 | Ht 63.5 in | Wt 131.0 lb

## 2016-03-20 DIAGNOSIS — R0602 Shortness of breath: Secondary | ICD-10-CM

## 2016-03-20 DIAGNOSIS — R42 Dizziness and giddiness: Secondary | ICD-10-CM | POA: Diagnosis not present

## 2016-03-20 DIAGNOSIS — J449 Chronic obstructive pulmonary disease, unspecified: Secondary | ICD-10-CM

## 2016-03-20 LAB — CBC WITH DIFFERENTIAL/PLATELET
Basophils Absolute: 78 cells/uL (ref 0–200)
Basophils Relative: 1 %
EOS PCT: 3 %
Eosinophils Absolute: 234 cells/uL (ref 15–500)
HCT: 44.7 % (ref 35.0–45.0)
Hemoglobin: 15.1 g/dL (ref 11.7–15.5)
LYMPHS PCT: 22 %
Lymphs Abs: 1716 cells/uL (ref 850–3900)
MCH: 29.4 pg (ref 27.0–33.0)
MCHC: 33.8 g/dL (ref 32.0–36.0)
MCV: 87 fL (ref 80.0–100.0)
MONOS PCT: 10 %
MPV: 8.7 fL (ref 7.5–12.5)
Monocytes Absolute: 780 cells/uL (ref 200–950)
NEUTROS PCT: 64 %
Neutro Abs: 4992 cells/uL (ref 1500–7800)
Platelets: 360 10*3/uL (ref 140–400)
RBC: 5.14 MIL/uL — AB (ref 3.80–5.10)
RDW: 13.3 % (ref 11.0–15.0)
WBC: 7.8 10*3/uL (ref 3.8–10.8)

## 2016-03-20 LAB — BASIC METABOLIC PANEL
BUN: 12 mg/dL (ref 7–25)
CALCIUM: 9.7 mg/dL (ref 8.6–10.4)
CO2: 23 mmol/L (ref 20–31)
Chloride: 105 mmol/L (ref 98–110)
Creat: 0.7 mg/dL (ref 0.60–0.93)
GLUCOSE: 93 mg/dL (ref 65–99)
POTASSIUM: 4.5 mmol/L (ref 3.5–5.3)
SODIUM: 140 mmol/L (ref 135–146)

## 2016-03-20 LAB — BRAIN NATRIURETIC PEPTIDE: BRAIN NATRIURETIC PEPTIDE: 9.6 pg/mL (ref <100)

## 2016-03-20 NOTE — Progress Notes (Signed)
Subjective:    Patient ID: Mary Jordan, female    DOB: 1934/09/27, 81 y.o.   MRN: [id number]  81 yo female former smoker  With COPD -GOLD C   TEST  Lung function is at 54% 11/2015  03/20/2016 Follow up COPD  Returns for 4 month follow up .  Pt was changed from Symbicort to [place] last ov .  Says her arthritis in her hands limited her with the stiolto device.  She went back to Symbicort . Cost is an issue at times,would like samples if we have any.  Discussed nebs but she declines.  She says her breathing is about the same without flare of cough or wheezing  She gets winded if she bends over, walks long distance or incline.  Does complain last 6 weeks when she is outside in heat she has had "swooning " episodes which sounds like lightheadeness on occasion . Resolves when she gets inside and sits down. No syncope, chest pain, palipations, jaw or shoulder pain, n/v/d, extremity weakness, visual/speech changes . No recent labs..  Feels etoh may make worse but not sure.  She remains active . Does water exercises at [facility] 4-5 days a week for 1 hr .  Never gets sob in water.   No hemoptysis , chest pain,orthopnea, edema or fever.  No n/v/d or blood stools.  Prevnar and Pneumovax are utd.   Past Medical History:  Diagnosis Date  . Allergic rhinitis   . Anemia   . B12 deficiency   . COPD (chronic obstructive pulmonary disease) ([place])   . Hyperlipidemia   . Insomnia   . Lung mass    s/p excision of RUL benign 2007  . Menopause   . Osteoarthritis   . Osteoporosis    Current Outpatient Prescriptions on File Prior to Visit  Medication Sig Dispense Refill  . albuterol (PROVENTIL HFA;VENTOLIN HFA) 108 (90 Base) MCG/ACT inhaler Inhale 1-2 puffs into the lungs every 6 (six) hours as needed for wheezing. 18 g 5  . budesonide-formoterol (SYMBICORT) 160-4.5 MCG/ACT inhaler Inhale 2 puffs into the lungs 2 (two) times daily. 1 Inhaler 6  . clorazepate (TRANXENE) 3.75 MG tablet Take 1  tablet (3.75 mg total) by mouth at bedtime as needed for sleep. 30 tablet 4  . diclofenac sodium (VOLTAREN) 1 % GEL Apply 4 g topically 4 (four) times daily. 5 Tube 1  . simvastatin (ZOCOR) 10 MG tablet Take 1 tablet (10 mg total) by mouth at bedtime. 30 tablet 3   No current facility-administered medications on file prior to visit.     Review of Systems Constitutional:   No  weight loss, night sweats,  Fevers, chills, fatigue, lassitude. HEENT:   No headaches,  Difficulty swallowing,  Tooth/dental problems,  Sore throat,                No sneezing, itching, ear ache, nasal congestion, post nasal drip,   CV:  No chest pain,  Orthopnea, PND, swelling in lower extremities, anasarca, dizziness, palpitations  GI  No heartburn, indigestion, abdominal pain, nausea, vomiting, diarrhea, change in bowel habits, loss of appetite  Resp: Notes shortness of breath with exertion   .  No wheezing.  No chest wall deformity  Skin: no rash or lesions.  GU: no dysuria, change in color of urine, no urgency or frequency.  No flank pain.  MS:  No joint pain or swelling.  No decreased range of motion.  No back pain.  Psych:  No  change in mood or affect. No depression or anxiety.  No memory loss.     Objective:   Physical Exam Vitals:   03/20/16 1002  BP: 132/86  Pulse: 91  SpO2: 94%  Weight: 131 lb (59.4 kg)  Height: 5' 3.5" (1.613 m)     Gen: Pleasant, elderly and frail in no distress,  normal affect   ENT: No lesions,  mouth clear,  oropharynx clear, no postnasal drip  Neck: No JVD, no TMG, no carotid bruits  Lungs: No use of accessory muscles, no dullness to percussion, distant breath sounds  Cardiovascular: RRR, heart sounds normal, no murmur or gallops, no peripheral edema  Abdomen: soft and NT, no HSM,  BS normal  Musculoskeletal: No deformities, no cyanosis or clubbing  Neuro: alert, non focal, nml gait.   Skin: Warm, no lesions or rashes    CXR 01/18/2015  Resolved pNA    Chronic changes    Patrice Moates NP-C  [facility]  03/20/2016

## 2016-03-20 NOTE — Assessment & Plan Note (Signed)
Intermittent episodes of lightheadedness ? Etiology  Appears mild with no sign associated sx (cp, dyspnea , etc)  Advised to see PCP for further eval Will check labs with cbc, bmet and bnp and cxr  Advised to change positions slowly , fluids and avoid extreme heat.

## 2016-03-20 NOTE — Assessment & Plan Note (Signed)
Breathing appears stable  Does not appear lightheaded episodes are related to her lung issues /copd Will check cxr today for completion .   Plan  Continue on current regimen  Chest xray and labs today .  Follow up with Dr. Larose Kells regarding lightheadness.  Follow up in 3 months with Dr. Hubbard Hartshorn

## 2016-03-20 NOTE — Telephone Encounter (Signed)
[facility] called with Critical/High RBC results 5.14 for this pt at 3:10pm....KMP

## 2016-03-20 NOTE — Patient Instructions (Addendum)
Continue on current regimen  Chest xray and labs today .  Follow up with Dr. Larose Kells regarding lightheadness.  Follow up in 3 months with Dr. Hubbard Hartshorn

## 2016-03-24 ENCOUNTER — Encounter: Payer: Self-pay | Admitting: Internal Medicine

## 2016-03-24 NOTE — Telephone Encounter (Signed)
error 

## 2016-03-25 NOTE — Telephone Encounter (Signed)
Appointment was made

## 2016-04-07 ENCOUNTER — Ambulatory Visit (HOSPITAL_BASED_OUTPATIENT_CLINIC_OR_DEPARTMENT_OTHER): Payer: Medicare Other

## 2016-04-10 ENCOUNTER — Ambulatory Visit (HOSPITAL_BASED_OUTPATIENT_CLINIC_OR_DEPARTMENT_OTHER): Payer: Medicare Other

## 2016-04-11 ENCOUNTER — Ambulatory Visit (HOSPITAL_BASED_OUTPATIENT_CLINIC_OR_DEPARTMENT_OTHER): Payer: Medicare Other

## 2016-04-24 ENCOUNTER — Ambulatory Visit (HOSPITAL_BASED_OUTPATIENT_CLINIC_OR_DEPARTMENT_OTHER)
Admission: RE | Admit: 2016-04-24 | Discharge: 2016-04-24 | Disposition: A | Discharge status: Home / Self Care | Payer: Medicare Other | Source: Ambulatory Visit | Attending: Internal Medicine | Admitting: Internal Medicine

## 2016-04-24 DIAGNOSIS — Z1231 Encounter for screening mammogram for malignant neoplasm of breast: Secondary | ICD-10-CM | POA: Diagnosis not present

## 2016-05-08 DIAGNOSIS — D4981 Neoplasm of unspecified behavior of retina and choroid: Secondary | ICD-10-CM | POA: Diagnosis not present

## 2016-05-08 DIAGNOSIS — H524 Presbyopia: Secondary | ICD-10-CM | POA: Diagnosis not present

## 2016-05-08 DIAGNOSIS — H26493 Other secondary cataract, bilateral: Secondary | ICD-10-CM | POA: Diagnosis not present

## 2016-05-08 DIAGNOSIS — Z961 Presence of intraocular lens: Secondary | ICD-10-CM | POA: Diagnosis not present

## 2016-05-13 DIAGNOSIS — H524 Presbyopia: Secondary | ICD-10-CM | POA: Diagnosis not present

## 2016-05-13 DIAGNOSIS — H26492 Other secondary cataract, left eye: Secondary | ICD-10-CM | POA: Diagnosis not present

## 2016-05-13 DIAGNOSIS — D4981 Neoplasm of unspecified behavior of retina and choroid: Secondary | ICD-10-CM | POA: Diagnosis not present

## 2016-05-13 DIAGNOSIS — Z961 Presence of intraocular lens: Secondary | ICD-10-CM | POA: Diagnosis not present

## 2016-05-23 ENCOUNTER — Other Ambulatory Visit: Payer: Self-pay

## 2016-05-23 MED ORDER — SIMVASTATIN 10 MG PO TABS: 10.0000 mg | ORAL_TABLET | Freq: Every day | ORAL | 2 refills | Status: DC

## 2016-06-10 DIAGNOSIS — Z23 Encounter for immunization: Secondary | ICD-10-CM | POA: Diagnosis not present

## 2016-06-26 ENCOUNTER — Ambulatory Visit (INDEPENDENT_AMBULATORY_CARE_PROVIDER_SITE_OTHER): Payer: Medicare Other | Admitting: Adult Health

## 2016-06-26 ENCOUNTER — Encounter: Payer: Self-pay | Admitting: Adult Health

## 2016-06-26 ENCOUNTER — Telehealth: Payer: Self-pay | Admitting: Adult Health

## 2016-06-26 DIAGNOSIS — J449 Chronic obstructive pulmonary disease, unspecified: Secondary | ICD-10-CM | POA: Diagnosis not present

## 2016-06-26 DIAGNOSIS — J302 Other seasonal allergic rhinitis: Secondary | ICD-10-CM

## 2016-06-26 MED ORDER — ALBUTEROL SULFATE HFA 108 (90 BASE) MCG/ACT IN AERS: 1.0000 | INHALATION_SPRAY | Freq: Four times a day (QID) | RESPIRATORY_TRACT | 5 refills | Status: DC | PRN

## 2016-06-26 MED ORDER — BUDESONIDE-FORMOTEROL FUMARATE 160-4.5 MCG/ACT IN AERO: 2.0000 | INHALATION_SPRAY | Freq: Two times a day (BID) | RESPIRATORY_TRACT | 0 refills | Status: DC

## 2016-06-26 MED ORDER — BUDESONIDE-FORMOTEROL FUMARATE 160-4.5 MCG/ACT IN AERO: 2.0000 | INHALATION_SPRAY | Freq: Two times a day (BID) | RESPIRATORY_TRACT | 6 refills | Status: DC

## 2016-06-26 MED ORDER — FIRST-DUKES MOUTHWASH MT SUSP: 15.0000 mL | Freq: Three times a day (TID) | OROMUCOSAL | 0 refills | Status: DC | PRN

## 2016-06-26 MED ORDER — AZELASTINE-FLUTICASONE 137-50 MCG/ACT NA SUSP: 1.0000 | Freq: Every day | NASAL | 0 refills | Status: DC

## 2016-06-26 NOTE — Telephone Encounter (Signed)
Called and spoke with pt and she stated that she was seen by TP today and med that was sent in was the wrong med.  She stated that TP sent in ventolin HFA for her and she wanted the proventil HFA.  I advised the pt that these are the same and that they are both albuterol. She stated that she felt that the proventil worked better for her.    She would like her med list updated so the next time she will get the proventil hfa refilled.  TP please advise. thanks

## 2016-06-26 NOTE — Telephone Encounter (Signed)
Per visit with TP today, pt specifically requested Proventil but Ventolin was mistakenly indicated in the prescription [facility] and spoke with pharmacist Sam - pt already picked up the Rx but if she brings it back, he will gladly change that out to a Proventil.  LMOM TCB x1 to discuss with patient

## 2016-06-26 NOTE — Telephone Encounter (Signed)
Pt returned call and I let he know that she could take med back and exchange it.Hillery Hunter

## 2016-06-26 NOTE — Assessment & Plan Note (Signed)
Flare   Plan Patient Instructions  Saline nasal rinses As needed  Twice daily   Begin Dymista Nasal 2 puffs .daily until sample is gone.  Begin Zyrtec 10mg  At bedtime  For 1 week then As needed  For drainage.  Majic Mouthwash 1 tsp, gargle and spit Three times a day  As needed  For sore throat .  Continue on Symbicort 2 puffs Twice daily  , rinse after use  Follow up in 4 months with Dr. Elsworth Soho  And As needed   Please contact office for sooner follow up if symptoms do not improve or worsen or seek emergency care

## 2016-06-26 NOTE — Patient Instructions (Addendum)
Saline nasal rinses As needed  Twice daily   Begin Dymista Nasal 2 puffs .daily until sample is gone.  Begin Zyrtec 10mg  At bedtime  For 1 week then As needed  For drainage.  Majic Mouthwash 1 tsp, gargle and spit Three times a day  As needed  For sore throat .  Continue on Symbicort 2 puffs Twice daily  , rinse after use  Follow up in 4 months with Dr. Elsworth Soho  And As needed   Please contact office for sooner follow up if symptoms do not improve or worsen or seek emergency care

## 2016-06-26 NOTE — Assessment & Plan Note (Signed)
Stable without flare . Suspect rhinitis flare , tx drainage.   Plan  Patient Instructions  Saline nasal rinses As needed  Twice daily   Begin Dymista Nasal 2 puffs .daily until sample is gone.  Begin Zyrtec 10mg  At bedtime  For 1 week then As needed  For drainage.  Majic Mouthwash 1 tsp, gargle and spit Three times a day  As needed  For sore throat .  Continue on Symbicort 2 puffs Twice daily  , rinse after use  Follow up in 4 months with Dr. Elsworth Soho  And As needed   Please contact office for sooner follow up if symptoms do not improve or worsen or seek emergency care

## 2016-06-26 NOTE — Progress Notes (Signed)
Subjective:    Patient ID: BABY EAGLES, female    DOB: Aug 30, 1934, 81 y.o.   MRN: [id number]  81 yo female former smoker  With COPD -GOLD C   TEST  Lung function is at 54% 11/2015  06/26/2016 Follow up COPD  Returns for 4 month follow up .  Remains of Symbicort , feels breathing is stable .  She says her breathing is about the same without flare of cough or wheezing  She gets winded if she bends over, walks long distance or incline.   Does complains of 2 weeks of runny nose, drainage, sore throat and hoarseness. Has tried mucinex, salt water gargles, claritin . Does feel some better but not going away totally. No dysphagia, gerd, chest pain, fever or discolored mucus. Good appetite and no n/v/d She remains active . Does water exercises at [facility] 4-5 days a week for 1 hr .   No hemoptysis , chest pain,orthopnea, edema or fever.  No n/v/d or blood stools.  Flu and Prevnar and Pneumovax are utd.   Past Medical History:  Diagnosis Date  . Allergic rhinitis   . Anemia   . B12 deficiency   . COPD (chronic obstructive pulmonary disease) ([place])   . Hyperlipidemia   . Insomnia   . Lung mass    s/p excision of RUL benign 2007  . Menopause   . Osteoarthritis   . Osteoporosis    Current Outpatient Prescriptions on File Prior to Visit  Medication Sig Dispense Refill  . albuterol (PROVENTIL HFA;VENTOLIN HFA) 108 (90 Base) MCG/ACT inhaler Inhale 1-2 puffs into the lungs every 6 (six) hours as needed for wheezing. 18 g 5  . budesonide-formoterol (SYMBICORT) 160-4.5 MCG/ACT inhaler Inhale 2 puffs into the lungs 2 (two) times daily. 1 Inhaler 6  . clorazepate (TRANXENE) 3.75 MG tablet Take 1 tablet (3.75 mg total) by mouth at bedtime as needed for sleep. 30 tablet 4  . simvastatin (ZOCOR) 10 MG tablet Take 1 tablet (10 mg total) by mouth at bedtime. 30 tablet 2  . vitamin E (VITAMIN E) 400 UNIT capsule Take 400 Units by mouth daily.     No current facility-administered medications on  file prior to visit.     Review of Systems Constitutional:   No  weight loss, night sweats,  Fevers, chills, fatigue, lassitude. HEENT:   No headaches,  Difficulty swallowing,  Tooth/dental problems,  Sore throat,                No sneezing, itching, ear ache, + nasal congestion, post nasal drip,   CV:  No chest pain,  Orthopnea, PND, swelling in lower extremities, anasarca, dizziness, palpitations  GI  No heartburn, indigestion, abdominal pain, nausea, vomiting, diarrhea, change in bowel habits, loss of appetite  Resp: Notes shortness of breath with exertion   .  No wheezing.  No chest wall deformity  Skin: no rash or lesions.  GU: no dysuria, change in color of urine, no urgency or frequency.  No flank pain.  MS:  No joint pain or swelling.  No decreased range of motion.  No back pain.  Psych:  No change in mood or affect. No depression or anxiety.  No memory loss.     Objective:   Physical Exam Vitals:   06/26/16 1016  BP: 118/72  Pulse: 81  SpO2: 95%  Weight: 132 lb (59.9 kg)  Height: 5' 3.5" (1.613 m)     Gen: Pleasant, elderly and frail in no  distress,  normal affect   ENT: No lesions,  mouth clear,  oropharynx clear, no postnasal drip, no exudate .   Neck: No JVD, no TMG, no carotid bruits  Lungs: No use of accessory muscles, no dullness to percussion, distant breath sounds  Cardiovascular: RRR, heart sounds normal, no murmur or gallops, no peripheral edema  Abdomen: soft and NT, no HSM,  BS normal  Musculoskeletal: No deformities, no cyanosis or clubbing  Neuro: alert, non focal, nml gait.   Skin: Warm, no lesions or rashes     CXR 03/2016 >nad.   Elizabth Palka NP-C   [facility]  06/26/2016

## 2016-06-27 NOTE — Telephone Encounter (Signed)
Pt aware of below message. Pt voiced understanding & had no further questions. Nothing further needed.

## 2016-07-02 NOTE — Progress Notes (Signed)
Reviewed & agree with plan  

## 2016-07-14 DIAGNOSIS — Z08 Encounter for follow-up examination after completed treatment for malignant neoplasm: Secondary | ICD-10-CM | POA: Diagnosis not present

## 2016-07-14 DIAGNOSIS — Z85828 Personal history of other malignant neoplasm of skin: Secondary | ICD-10-CM | POA: Diagnosis not present

## 2016-07-14 DIAGNOSIS — L570 Actinic keratosis: Secondary | ICD-10-CM | POA: Diagnosis not present

## 2016-07-24 ENCOUNTER — Telehealth: Payer: Self-pay | Admitting: Adult Health

## 2016-07-24 DIAGNOSIS — J312 Chronic pharyngitis: Secondary | ICD-10-CM

## 2016-07-24 NOTE — Telephone Encounter (Signed)
[phone] pt calling back

## 2016-07-24 NOTE — Telephone Encounter (Signed)
Spoke with pt. States that her sore throat has returned. Reports that she saw TP back in November for the same issue. She is still taking all of medications that TP recommended then. Pt would like to know if TP has any other recommendations for her.  TP - please advise. Thanks.

## 2016-07-24 NOTE — Telephone Encounter (Signed)
Make sure still taking zyrtec daily As needed   If still present and not going away can refer to ENT for evaluation  Please contact office for sooner follow up if symptoms do not improve or worsen or seek emergency care

## 2016-07-24 NOTE — Telephone Encounter (Signed)
Called and spoke with pt and she is aware of referral that has been placed. Nothing further is needed

## 2016-07-24 NOTE — Telephone Encounter (Signed)
LM x 1 

## 2016-07-25 ENCOUNTER — Ambulatory Visit (INDEPENDENT_AMBULATORY_CARE_PROVIDER_SITE_OTHER): Payer: Medicare Other | Admitting: Internal Medicine

## 2016-07-25 ENCOUNTER — Encounter: Payer: Self-pay | Admitting: Internal Medicine

## 2016-07-25 VITALS — BP 122/68 | HR 68 | Temp 97.8°F | Resp 14 | Ht 63.5 in | Wt 132.5 lb

## 2016-07-25 DIAGNOSIS — J441 Chronic obstructive pulmonary disease with (acute) exacerbation: Secondary | ICD-10-CM

## 2016-07-25 MED ORDER — AZELASTINE HCL 0.1 % NA SOLN: 2.0000 | Freq: Two times a day (BID) | NASAL | 6 refills | Status: DC

## 2016-07-25 MED ORDER — PREDNISONE 10 MG PO TABS: ORAL_TABLET | ORAL | 0 refills | Status: DC

## 2016-07-25 MED ORDER — AZITHROMYCIN 250 MG PO TABS: ORAL_TABLET | ORAL | 0 refills | Status: DC

## 2016-07-25 NOTE — Patient Instructions (Signed)
  For cough:  Take Mucinex DM twice a day as needed until better  For nasal congestion: Use OTC Nasocort or Flonase : 2 nasal sprays on each side of the nose in the morning until you feel better Use ASTELIN a prescribed spray : 2 nasal sprays on each side of the nose  Twice a day   Zyrtec OTC every day  Avoid decongestants such as  Pseudoephedrine or phenylephrine   Take the antibiotic as prescribed  : Zithromax  Take prednisone for 5 days as prescribed  Call if not gradually better over the next  10 days  Call anytime if the symptoms are severe, you have high fever, short of breath, chest pain

## 2016-07-25 NOTE — Progress Notes (Signed)
Pre visit review using our clinic review tool, if applicable. No additional management support is needed unless otherwise documented below in the visit note. 

## 2016-07-25 NOTE — Progress Notes (Signed)
Subjective:    Patient ID: Mary Jordan, female    DOB: 1935/05/23, 81 y.o.   MRN: [id number]  DOS:  07/25/2016 Type of visit - description : acute Interval history: Her chief complaint today is sore throat. Symptoms started approximately 4 weeks ago, s/p dymista, on Zyrtec. Sore throat persists along with a dry cough which is above baseline. She has some right ear discomfort, mild headache, she sneezes frequently.   Review of Systems Denies fever chills Had a flu shot Denies itchy eyes or nose. No GERD symptoms  Past Medical History:  Diagnosis Date  . Allergic rhinitis   . Anemia   . B12 deficiency   . COPD (chronic obstructive pulmonary disease) ([place])   . Hyperlipidemia   . Insomnia   . Lung mass    s/p excision of RUL benign 2007  . Menopause   . Osteoarthritis   . Osteoporosis     Past Surgical History:  Procedure Laterality Date  . ABDOMINAL HYSTERECTOMY    . BREAST BIOPSY     remotely, neg  . CARDIOVASCULAR STRESS TEST  2007   Cardiolite negative  . CATARACT EXTRACTION, BILATERAL    . lesion from lung excised, benign  05-2006    Social History   Social History  . Marital status: Divorced    Spouse name: N/A  . Number of children: 2  . Years of education: N/A   Occupational History  . Retired Retired    Network engineer   Social History Main Topics  . Smoking status: Former Smoker    Packs/day: 1.00    Years: 39.00    Types: Cigarettes    Quit date: 08/11/1996  . Smokeless tobacco: Never Used     Comment:    . Alcohol use 3.6 oz/week    3 Cans of beer, 3 Glasses of wine per week     Comment: three times weekly  . Drug use: No  . Sexual activity: Not on file   Other Topics Concern  . Not on file   Social History Narrative   Lives by herself    Has 2 children, 4 gkids all boys              Allergies as of 07/25/2016      Reactions   Levofloxacin Hives      Medication List       Accurate as of 07/25/16 11:59 PM. Always use your  most recent med list.          albuterol 108 (90 Base) MCG/ACT inhaler Commonly known as:  PROVENTIL HFA;VENTOLIN HFA Inhale 1-2 puffs into the lungs every 6 (six) hours as needed for wheezing. Patient prefers Ventolin brand inhaler.   azelastine 0.1 % nasal spray Commonly known as:  ASTELIN Place 2 sprays into both nostrils 2 (two) times daily.   azithromycin 250 MG tablet Commonly known as:  ZITHROMAX Z-PAK 2 tabs a day the first day, then 1 tab a day x 4 days   budesonide-formoterol 160-4.5 MCG/ACT inhaler Commonly known as:  SYMBICORT Inhale 2 puffs into the lungs 2 (two) times daily.   calcium-vitamin D 250-100 MG-UNIT tablet Take 1 tablet by mouth 2 (two) times daily.   cetirizine 10 MG tablet Commonly known as:  ZYRTEC Take 10 mg by mouth daily.   clorazepate 3.75 MG tablet Commonly known as:  TRANXENE Take 1 tablet (3.75 mg total) by mouth at bedtime as needed for sleep.   predniSONE 10 MG tablet Commonly  known as:  DELTASONE 2 tabs a day x 5 days   simvastatin 10 MG tablet Commonly known as:  ZOCOR Take 1 tablet (10 mg total) by mouth at bedtime.   vitamin E 400 UNIT capsule Generic drug:  vitamin E Take 400 Units by mouth daily.          Objective:   Physical Exam BP 122/68 (BP Location: Left Arm, Patient Position: Sitting, Cuff Size: Small)   Pulse 68   Temp 97.8 F (36.6 C) (Oral)   Resp 14   Ht 5' 3.5" (1.613 m)   Wt 132 lb 8 oz (60.1 kg)   SpO2 96%   BMI 23.10 kg/m  General:   Well developed, well nourished . NAD.  HEENT:  Normocephalic . Face symmetric, atraumatic. Nose congested, sinuses no TTP. TMs normal. Throat symmetric, no red Lungs:  CTA B, only few rhonchi with cough. Normal respiratory effort, no intercostal retractions, no accessory muscle use. Heart: RRR,  no murmur.  No pretibial edema bilaterally  Skin: Not pale. Not jaundice Neurologic:  alert & oriented X3.  Speech normal, gait appropriate for age and  unassisted Psych--  Cognition and judgment appear intact.  Cooperative with normal attention span and concentration.  Behavior appropriate. No anxious or depressed appearing.      Assessment & Plan:   Assessment Hyperlipidemia Insomnia: Chronic, mild COPD B12 deficiency- B12 shots elsewhere Osteoporosis : h/o wrist Fx, T score -2.2  (2013), -2.1 (03-2015) Intolerant to fosamax, actonel $$, took reclast before ~2013 DJD Menopausal Lung mass RUL, s/p excision benign 2007  PLAN Allergies versus COPD exacerbation: Symptoms could be explained by allergies or a mild COPD exacerbation. Plan: Prednisone for a few days, Z-Pak, Astelin, Flonase, continue Zyrtec. Mucinex DM. Call if no better. Has a follow-up here 08/07/2016

## 2016-07-27 NOTE — Assessment & Plan Note (Signed)
Allergies versus COPD exacerbation: Symptoms could be explained by allergies or a mild COPD exacerbation. Plan: Prednisone for a few days, Z-Pak, Astelin, Flonase, continue Zyrtec. Mucinex DM. Call if no better. Has a follow-up here 08/07/2016

## 2016-07-28 ENCOUNTER — Telehealth: Payer: Self-pay | Admitting: *Deleted

## 2016-07-28 NOTE — Telephone Encounter (Signed)
Called patient and left message to return call

## 2016-07-30 ENCOUNTER — Telehealth: Payer: Self-pay | Admitting: *Deleted

## 2016-07-30 NOTE — Telephone Encounter (Signed)
Schedules AWV 08/07/16 @11 .

## 2016-08-04 ENCOUNTER — Encounter (HOSPITAL_COMMUNITY): Payer: Self-pay | Admitting: Vascular Surgery

## 2016-08-04 ENCOUNTER — Emergency Department (HOSPITAL_COMMUNITY)
Admission: EM | Admit: 2016-08-04 | Discharge: 2016-08-04 | Disposition: A | Discharge status: Home / Self Care | Payer: Medicare Other | Attending: Emergency Medicine | Admitting: Emergency Medicine

## 2016-08-04 ENCOUNTER — Emergency Department (HOSPITAL_COMMUNITY): Payer: Medicare Other

## 2016-08-04 DIAGNOSIS — F10120 Alcohol abuse with intoxication, uncomplicated: Secondary | ICD-10-CM | POA: Diagnosis not present

## 2016-08-04 DIAGNOSIS — S0101XA Laceration without foreign body of scalp, initial encounter: Secondary | ICD-10-CM | POA: Diagnosis not present

## 2016-08-04 DIAGNOSIS — W19XXXA Unspecified fall, initial encounter: Secondary | ICD-10-CM

## 2016-08-04 DIAGNOSIS — S064X1A Epidural hemorrhage with loss of consciousness of 30 minutes or less, initial encounter: Secondary | ICD-10-CM | POA: Diagnosis not present

## 2016-08-04 DIAGNOSIS — R410 Disorientation, unspecified: Secondary | ICD-10-CM | POA: Diagnosis not present

## 2016-08-04 DIAGNOSIS — S098XXA Other specified injuries of head, initial encounter: Secondary | ICD-10-CM | POA: Diagnosis not present

## 2016-08-04 DIAGNOSIS — Y9389 Activity, other specified: Secondary | ICD-10-CM | POA: Insufficient documentation

## 2016-08-04 DIAGNOSIS — Y999 Unspecified external cause status: Secondary | ICD-10-CM | POA: Diagnosis not present

## 2016-08-04 DIAGNOSIS — Z87891 Personal history of nicotine dependence: Secondary | ICD-10-CM | POA: Diagnosis not present

## 2016-08-04 DIAGNOSIS — S0990XA Unspecified injury of head, initial encounter: Secondary | ICD-10-CM | POA: Diagnosis not present

## 2016-08-04 DIAGNOSIS — W1839XA Other fall on same level, initial encounter: Secondary | ICD-10-CM | POA: Insufficient documentation

## 2016-08-04 DIAGNOSIS — J449 Chronic obstructive pulmonary disease, unspecified: Secondary | ICD-10-CM | POA: Insufficient documentation

## 2016-08-04 DIAGNOSIS — Z23 Encounter for immunization: Secondary | ICD-10-CM | POA: Insufficient documentation

## 2016-08-04 DIAGNOSIS — F10920 Alcohol use, unspecified with intoxication, uncomplicated: Secondary | ICD-10-CM

## 2016-08-04 DIAGNOSIS — Y92009 Unspecified place in unspecified non-institutional (private) residence as the place of occurrence of the external cause: Secondary | ICD-10-CM | POA: Diagnosis not present

## 2016-08-04 DIAGNOSIS — S199XXA Unspecified injury of neck, initial encounter: Secondary | ICD-10-CM | POA: Diagnosis not present

## 2016-08-04 LAB — CBC WITH DIFFERENTIAL/PLATELET
BASOS ABS: 0.1 10*3/uL (ref 0.0–0.1)
BASOS PCT: 1 %
EOS ABS: 0.1 10*3/uL (ref 0.0–0.7)
EOS PCT: 1 %
HEMATOCRIT: 46.4 % — AB (ref 36.0–46.0)
Hemoglobin: 15.3 g/dL — ABNORMAL HIGH (ref 12.0–15.0)
Lymphocytes Relative: 17 %
Lymphs Abs: 1.6 10*3/uL (ref 0.7–4.0)
MCH: 29.2 pg (ref 26.0–34.0)
MCHC: 33 g/dL (ref 30.0–36.0)
MCV: 88.5 fL (ref 78.0–100.0)
MONO ABS: 0.5 10*3/uL (ref 0.1–1.0)
MONOS PCT: 5 %
Neutro Abs: 7.6 10*3/uL (ref 1.7–7.7)
Neutrophils Relative %: 76 %
PLATELETS: 401 10*3/uL — AB (ref 150–400)
RBC: 5.24 MIL/uL — ABNORMAL HIGH (ref 3.87–5.11)
RDW: 13.3 % (ref 11.5–15.5)
WBC: 9.8 10*3/uL (ref 4.0–10.5)

## 2016-08-04 LAB — ETHANOL: ALCOHOL ETHYL (B): 216 mg/dL — AB (ref <5)

## 2016-08-04 LAB — BASIC METABOLIC PANEL
Anion gap: 7 (ref 5–15)
BUN: 9 mg/dL (ref 6–20)
CALCIUM: 8.8 mg/dL — AB (ref 8.9–10.3)
CO2: 25 mmol/L (ref 22–32)
CREATININE: 0.68 mg/dL (ref 0.44–1.00)
Chloride: 110 mmol/L (ref 101–111)
GFR calc Af Amer: >60 mL/min (ref >60)
GLUCOSE: 100 mg/dL — AB (ref 65–99)
POTASSIUM: 3.7 mmol/L (ref 3.5–5.1)
SODIUM: 142 mmol/L (ref 135–145)

## 2016-08-04 MED ORDER — TETANUS-DIPHTH-ACELL PERTUSSIS 5-2.5-18.5 LF-MCG/0.5 IM SUSP
0.5000 mL | Freq: Once | INTRAMUSCULAR | Status: AC
  Administered 2016-08-04: 0.5 mL via INTRAMUSCULAR
  Filled 2016-08-04: qty 0.5

## 2016-08-04 NOTE — ED Notes (Signed)
On arrival to ED to ED pt had c-collar in place.

## 2016-08-04 NOTE — ED Notes (Signed)
Patient transported to CT 

## 2016-08-04 NOTE — ED Notes (Signed)
Pt to ED via [facility] after reported falling at home.  Pt was combative enroute to hosp and EMS gave pt Ativan for same.  Pt admits to drinking  [place].

## 2016-08-04 NOTE — Discharge Instructions (Signed)
Staples should be removed in 7 days. That can be done by your primary care provider, at an urgent care center, or in the emergency department.  Limit alcohol consumption to no more than 1 glass of wine a day.

## 2016-08-04 NOTE — ED Notes (Signed)
Stick pt 2 times both blew. Unable to get all of blood

## 2016-08-04 NOTE — ED Triage Notes (Signed)
Pt reports to the ED via [facility] for eval of AMS and fall. Per EMS she called 911 and it appeared that the patient was . Pt was lying supine in the kitchen/dining room area. There was approx 50 ccs of blood that appears to have come from a laceration to her posterior head. Bleeding controlled. Pt does report some possible ETOH today. Pt was extremely altered/combative en route and was not answering questions appropriately. Pt's son arrived and stated that she was not herself at all. She is normally oriented and lives independently. Pt had to be given 5 mg of Midazolam en route for the agitation. Pt has several skim tears to her right hand. Unknown LSN.

## 2016-08-04 NOTE — ED Provider Notes (Signed)
[facility] DEPT Provider Note   CSN: [id number] Arrival date & time: 08/04/16  1754     History   Chief Complaint Chief Complaint  Patient presents with  . Fall    HPI Mary Jordan is a 81 y.o. female.  She apparently had some champagne at home and fell striking the back of her head. She does have some bleeding from her scalp. EMS arrived and noted that patient was agitated and they gave her some lorazepam. Following that, she did become hypoxic and they started her on some nasal oxygen. She is disoriented and does not know why she is here. Her son states that she is normally alert and conversant. She lives alone. EMS applied a stiff cervical collar and transported her here.   The history is provided by the patient, the EMS personnel and a relative. The history is limited by the condition of the patient (Altered mental status).  Fall     Past Medical History:  Diagnosis Date  . Allergic rhinitis   . Anemia   . B12 deficiency   . COPD (chronic obstructive pulmonary disease) ([place])   . Hyperlipidemia   . Insomnia   . Lung mass    s/p excision of RUL benign 2007  . Menopause   . Osteoarthritis   . Osteoporosis     Patient Active Problem List   Diagnosis Date Noted  . Lightheaded 03/20/2016  . PCP NOTES >>>> 06/30/2015  . COPD (chronic obstructive pulmonary disease) ([place]) 05/31/2015  . Left knee injury 05/26/2014  . Left shoulder pain 05/26/2014  . Elevated BP 11/16/2013  . Annual physical exam 03/26/2011  . CERVICAL RADICULOPATHY, LEFT 10/14/2010  . SPRAIN&STRAIN OTH SPEC SITES SHOULDER&UPPER ARM 10/11/2010  . COPD exacerbation ([place]) 09/05/2010  . Allergic rhinitis 05/29/2010  . ANEMIA, B12 DEFICIENCY 11/05/2007  . Dyslipidemia 10/06/2007  . DJD (degenerative joint disease) 10/06/2007  . Osteoporosis 06/07/2007  . INSOMNIA, CHRONIC, MILD 04/05/2007  . Nonspecific (abnormal) findings on radiological and other examination of body structure 04/05/2007     Past Surgical History:  Procedure Laterality Date  . ABDOMINAL HYSTERECTOMY    . BREAST BIOPSY     remotely, neg  . CARDIOVASCULAR STRESS TEST  2007   Cardiolite negative  . CATARACT EXTRACTION, BILATERAL    . lesion from lung excised, benign  05-2006    OB History    No data available       Home Medications    Prior to Admission medications   Medication Sig Start Date End Date Taking? Authorizing Provider  albuterol (PROVENTIL HFA;VENTOLIN HFA) 108 (90 Base) MCG/ACT inhaler Inhale 1-2 puffs into the lungs every 6 (six) hours as needed for wheezing. Patient prefers Ventolin brand inhaler. 06/26/16   Tammy S Parrett, NP  azelastine (ASTELIN) 0.1 % nasal spray Place 2 sprays into both nostrils 2 (two) times daily. 07/25/16   Colon Branch, MD  azithromycin (ZITHROMAX Z-PAK) 250 MG tablet 2 tabs a day the first day, then 1 tab a day x 4 days 07/25/16   Colon Branch, MD  budesonide-formoterol [facility]) 160-4.5 MCG/ACT inhaler Inhale 2 puffs into the lungs 2 (two) times daily. 06/26/16   Tammy S Parrett, NP  calcium-vitamin D 250-100 MG-UNIT tablet Take 1 tablet by mouth 2 (two) times daily.    Historical Provider, MD  cetirizine (ZYRTEC) 10 MG tablet Take 10 mg by mouth daily.    Historical Provider, MD  clorazepate (TRANXENE) 3.75 MG tablet Take 1 tablet (3.75  mg total) by mouth at bedtime as needed for sleep. 02/25/16   Colon Branch, MD  predniSONE (DELTASONE) 10 MG tablet 2 tabs a day x 5 days 07/25/16   Colon Branch, MD  simvastatin (ZOCOR) 10 MG tablet Take 1 tablet (10 mg total) by mouth at bedtime. 05/23/16   Colon Branch, MD  vitamin E (VITAMIN E) 400 UNIT capsule Take 400 Units by mouth daily.    Historical Provider, MD    Family History Family History  Problem Relation Age of Onset  . Heart attack Father 99  . Dementia Mother   . Breast cancer Mother   . Asthma Mother   . Hyperlipidemia Neg Hx   . Diabetes Neg Hx   . Stroke Neg Hx   . Colon cancer Neg Hx     Social  History Social History  Substance Use Topics  . Smoking status: Former Smoker    Packs/day: 1.00    Years: 39.00    Types: Cigarettes    Quit date: 08/11/1996  . Smokeless tobacco: Never Used     Comment:    . Alcohol use 3.6 oz/week    3 Cans of beer, 3 Glasses of wine per week     Comment: three times weekly     Allergies   Levofloxacin   Review of Systems Review of Systems  All other systems reviewed and are negative.    Physical Exam Updated Vital Signs BP 115/72   Pulse 104   Temp 97.7 F (36.5 C) (Oral)   Resp 19   SpO2 95%   Physical Exam  Nursing note and vitals reviewed.  81 year old female, resting comfortably and in no acute distress. Vital signs are Significant for borderline tachycardia. Oxygen saturation is 95%, which is normal. Head is normocephalic. Dried blood is present in the right occipital area but I am unable to evaluate that area due to presence of a stiff cervical collar. PERRLA, EOMI. Oropharynx is clear. Neck is immobilized in a stiff cervical collar and is nontender. There is no adenopathy or JVD. Back is nontender and there is no CVA tenderness. Lungs are clear without rales, wheezes, or rhonchi. Chest is nontender. Heart has regular rate and rhythm without murmur. Abdomen is soft, flat, nontender without masses or hepatosplenomegaly and peristalsis is normoactive. Extremities have no cyanosis or edema, full range of motion is present. Skin is warm and dry without rash. Neurologic: She is awake and oriented to person but not place or time, cranial nerves are intact, there are no motor or sensory deficits.  ED Treatments / Results  Labs (all labs ordered are listed, but only abnormal results are displayed) Labs Reviewed  BASIC METABOLIC PANEL - Abnormal; Notable for the following:       Result Value   Glucose, Bld 100 (*)    Calcium 8.8 (*)    All other components within normal limits  ETHANOL - Abnormal; Notable for the following:      Alcohol, Ethyl (B) 216 (*)    All other components within normal limits  CBC WITH DIFFERENTIAL/PLATELET - Abnormal; Notable for the following:    RBC 5.24 (*)    Hemoglobin 15.3 (*)    HCT 46.4 (*)    Platelets 401 (*)    All other components within normal limits    Radiology Ct Head Wo Contrast  Result Date: 08/04/2016 CLINICAL DATA:  Patient fell and hit back of head on sidewalk. Large gash to back  of head. EXAM: CT HEAD WITHOUT CONTRAST CT CERVICAL SPINE WITHOUT CONTRAST TECHNIQUE: Multidetector CT imaging of the head and cervical spine was performed following the standard protocol without intravenous contrast. Multiplanar CT image reconstructions of the cervical spine were also generated. COMPARISON:  None. FINDINGS: BRAIN: There is sulcal and ventricular prominence consistent with superficial and central atrophy. No intraparenchymal hemorrhage, mass effect nor midline shift. There are patchy periventricular and subcortical white matter hypodensities consistent with chronic small vessel ischemic disease. No acute large vascular territory infarcts. No abnormal extra-axial fluid collections. Basal cisterns are patent. VASCULAR: Moderate calcific atherosclerosis of the carotid siphons. SKULL: No skull fracture. No significant scalp soft tissue swelling. SINUSES/ORBITS: The mastoid air-cells are relatively clear. Small air-fluid levels noted in both maxillary sinuses. Concha bullosa of the right middle turbinate. Mild left nasal septal deviation with left-sided nasal septal spur. Mild ethmoid sinus mucosal thickening.The included ocular globes and orbital contents are non-suspicious. OTHER: None. CT CERVICAL SPINE FINDINGS Alignment: Maintained cervical lordosis Skull base and vertebrae: The craniocervical relationship is intact. Osteoarthritis of the atlantodental interval. No vertebral body fracture. Facet joints are aligned. Soft tissues and spinal canal: No prevertebral soft tissue swelling. No  spinal canal hemorrhage. Disc levels: Degenerate disc disease from C4 through C7 with small posterior marginal osteophytes. C2-C3: Mild central disc bulge. No significant neural foraminal encroachment. C3-C4: Mild-to-moderate central to slightly left-sided disc bulge touching upon the thecal sac slightly impressing upon. C4-C5: Disc space narrowing with moderate right and mild left neural foraminal encroachment from osteophytes. C5-C6: Disc space narrowing with mild disc-osteophyte complex. Moderate neural foraminal stenosis from osteophytes. C6-C7: Mild central disc bulge. No significant neural foraminal encroachment . C7-T1:  Negative Upper chest: Upper lobe emphysema. Other: Ectatic right jugular vein. Extracranial carotid arteriosclerosis. IMPRESSION: Chronic small vessel ischemic disease of periventricular white matter with cerebral atrophy. No acute intracranial abnormality. Small air-fluid levels in the maxillary sinuses, query acute sinusitis. No acute osseous abnormality. No acute cervical spinal abnormality. Degenerative disc disease C4 through C7. Electronically Signed   By: Ashley Royalty M.D.   On: 08/04/2016 20:55   Ct Cervical Spine Wo Contrast  Result Date: 08/04/2016 CLINICAL DATA:  Patient fell and hit back of head on sidewalk. Large gash to back of head. EXAM: CT HEAD WITHOUT CONTRAST CT CERVICAL SPINE WITHOUT CONTRAST TECHNIQUE: Multidetector CT imaging of the head and cervical spine was performed following the standard protocol without intravenous contrast. Multiplanar CT image reconstructions of the cervical spine were also generated. COMPARISON:  None. FINDINGS: BRAIN: There is sulcal and ventricular prominence consistent with superficial and central atrophy. No intraparenchymal hemorrhage, mass effect nor midline shift. There are patchy periventricular and subcortical white matter hypodensities consistent with chronic small vessel ischemic disease. No acute large vascular territory  infarcts. No abnormal extra-axial fluid collections. Basal cisterns are patent. VASCULAR: Moderate calcific atherosclerosis of the carotid siphons. SKULL: No skull fracture. No significant scalp soft tissue swelling. SINUSES/ORBITS: The mastoid air-cells are relatively clear. Small air-fluid levels noted in both maxillary sinuses. Concha bullosa of the right middle turbinate. Mild left nasal septal deviation with left-sided nasal septal spur. Mild ethmoid sinus mucosal thickening.The included ocular globes and orbital contents are non-suspicious. OTHER: None. CT CERVICAL SPINE FINDINGS Alignment: Maintained cervical lordosis Skull base and vertebrae: The craniocervical relationship is intact. Osteoarthritis of the atlantodental interval. No vertebral body fracture. Facet joints are aligned. Soft tissues and spinal canal: No prevertebral soft tissue swelling. No spinal canal hemorrhage. Disc levels: Degenerate disc disease  from C4 through C7 with small posterior marginal osteophytes. C2-C3: Mild central disc bulge. No significant neural foraminal encroachment. C3-C4: Mild-to-moderate central to slightly left-sided disc bulge touching upon the thecal sac slightly impressing upon. C4-C5: Disc space narrowing with moderate right and mild left neural foraminal encroachment from osteophytes. C5-C6: Disc space narrowing with mild disc-osteophyte complex. Moderate neural foraminal stenosis from osteophytes. C6-C7: Mild central disc bulge. No significant neural foraminal encroachment . C7-T1:  Negative Upper chest: Upper lobe emphysema. Other: Ectatic right jugular vein. Extracranial carotid arteriosclerosis. IMPRESSION: Chronic small vessel ischemic disease of periventricular white matter with cerebral atrophy. No acute intracranial abnormality. Small air-fluid levels in the maxillary sinuses, query acute sinusitis. No acute osseous abnormality. No acute cervical spinal abnormality. Degenerative disc disease C4 through C7.  Electronically Signed   By: Ashley Royalty M.D.   On: 08/04/2016 20:55    Procedures Procedures (including critical care time) LACERATION REPAIR Performed by: [id number] Authorized by: [id number] Consent: Verbal consent obtained. Risks and benefits: risks, benefits and alternatives were discussed Consent given by: patient Patient identity confirmed: provided demographic data Prepped and Draped in normal sterile fashion Wound explored  Laceration Location: Scalp  Laceration Length: 1 cm  No Foreign Bodies seen or palpated  Anesthesia: None   Amount of cleaning: standard  Skin closure: Close   Number of staples: 2   Technique: Surgical stapling   Patient tolerance: Patient tolerated the procedure well with no immediate complications.   Medications Ordered in ED Medications  Tdap (BOOSTRIX) injection 0.5 mL (not administered)     Initial Impression / Assessment and Plan / ED Course  I have reviewed the triage vital signs and the nursing notes.  Pertinent labs & imaging results that were available during my care of the patient were reviewed by me and considered in my medical decision making (see chart for details).  Clinical Course    History injury with the confusion that is no. Patient had an unknown amount of alcohol and that may be contributing to her confusion. She is being sent for CT of head and will check ethanol level. Old records are reviewed, and last tetanus immunization was in 2018. Since she is just about due for routine immunization,Tdap booster is given.   CT scans were unremarkable. Alcohol level is come back significant elevated at 216 which probably accounts for her fall. Laceration is noted on the occiput proximally 1 cm and is closed with staples. She is discharged with routine wound care instructions and advised to limit her alcohol consumption to one normal-sized glass of wine a day.  Final Clinical Impressions(s) / ED Diagnoses   Final  diagnoses:  Fall at home, initial encounter  Alcohol intoxication, uncomplicated ([place])  Scalp laceration, initial encounter    New Prescriptions New Prescriptions   No medications on file     Delora Fuel, MD [phone]

## 2016-08-05 NOTE — Progress Notes (Deleted)
Subjective:   Mary Jordan is a 81 y.o. female who presents for Medicare Annual (Subsequent) preventive examination.  Review of Systems:  No ROS.  Medicare Wellness Visit.   Sleep patterns: {SX; SLEEP PATTERNS:18802::"feels rested on waking","does not get up to void","gets up *** times nightly to void","*** hours nightly"}.   Home Safety/Smoke Alarms:   Living environment; residence and Firearm Safety: {Rehab home environment / accessibility:30080::"no firearms","firearms stored safely"}. Seat Belt Safety/Bike Helmet:   Counseling:   Eye Exam-  Dental-  Female:   Pap- Hysterectomy      Mammo- Last 04/24/16: BI-RADS CATEGORY  1: Negative.      Dexa scan- Last on 03/13/15:Osteopenia.  Calcium, Vit D, and other treatment options suggested.      CCS- Last on 06/25/12. Polyp removed from colon was adenomatous. F/u recommended for 5 years per report.     Objective:     Vitals: There were no vitals taken for this visit.  There is no height or weight on file to calculate BMI.   Tobacco History  Smoking Status  . Former Smoker  . Packs/day: 1.00  . Years: 39.00  . Types: Cigarettes  . Quit date: 08/11/1996  Smokeless Tobacco  . Never Used    Comment:       Counseling given: Not Answered   Past Medical History:  Diagnosis Date  . Allergic rhinitis   . Anemia   . B12 deficiency   . COPD (chronic obstructive pulmonary disease) ([place])   . Hyperlipidemia   . Insomnia   . Lung mass    s/p excision of RUL benign 2007  . Menopause   . Osteoarthritis   . Osteoporosis    Past Surgical History:  Procedure Laterality Date  . ABDOMINAL HYSTERECTOMY    . BREAST BIOPSY     remotely, neg  . CARDIOVASCULAR STRESS TEST  2007   Cardiolite negative  . CATARACT EXTRACTION, BILATERAL    . lesion from lung excised, benign  05-2006   Family History  Problem Relation Age of Onset  . Heart attack Father 63  . Dementia Mother   . Breast cancer Mother   . Asthma Mother   .  Hyperlipidemia Neg Hx   . Diabetes Neg Hx   . Stroke Neg Hx   . Colon cancer Neg Hx    History  Sexual Activity  . Sexual activity: Not on file    Outpatient Encounter Prescriptions as of 08/07/2016  Medication Sig  . albuterol (PROVENTIL HFA;VENTOLIN HFA) 108 (90 Base) MCG/ACT inhaler Inhale 1-2 puffs into the lungs every 6 (six) hours as needed for wheezing. Patient prefers Ventolin brand inhaler.  Marland Kitchen azelastine (ASTELIN) 0.1 % nasal spray Place 2 sprays into both nostrils 2 (two) times daily.  Marland Kitchen azithromycin (ZITHROMAX Z-PAK) 250 MG tablet 2 tabs a day the first day, then 1 tab a day x 4 days  . budesonide-formoterol (SYMBICORT) 160-4.5 MCG/ACT inhaler Inhale 2 puffs into the lungs 2 (two) times daily.  . calcium-vitamin D 250-100 MG-UNIT tablet Take 1 tablet by mouth 2 (two) times daily.  . cetirizine (ZYRTEC) 10 MG tablet Take 10 mg by mouth daily.  . clorazepate (TRANXENE) 3.75 MG tablet Take 1 tablet (3.75 mg total) by mouth at bedtime as needed for sleep.  . predniSONE (DELTASONE) 10 MG tablet 2 tabs a day x 5 days  . simvastatin (ZOCOR) 10 MG tablet Take 1 tablet (10 mg total) by mouth at bedtime.  . vitamin E (VITAMIN  E) 400 UNIT capsule Take 400 Units by mouth daily.   No facility-administered encounter medications on file as of 08/07/2016.     Activities of Daily Living In your present state of health, do you have any difficulty performing the following activities: 02/25/2016  Hearing? N  Vision? N  Difficulty concentrating or making decisions? N  Walking or climbing stairs? N  Dressing or bathing? N  Doing errands, shopping? N  Some recent data might be hidden    Patient Care Team: Colon Branch, MD as PCP - General Elsie Stain, MD as Attending Physician (Pulmonary Disease) Melvenia Needles, NP as Nurse Practitioner (Pulmonary Disease)    Assessment:    Physical assessment deferred to PCP.  Exercise Activities and Dietary recommendations   Diet (meal  preparation, eat out, water intake, caffeinated beverages, dairy products, fruits and vegetables): {Desc; diets:16563} Breakfast: Lunch:  Dinner:      Goals    None     Fall Risk Fall Risk  02/25/2016 03/02/2015 11/16/2013 08/02/2012  Falls in the past year? No No No No   Depression Screen PHQ 2/9 Scores 02/25/2016 03/02/2015 11/16/2013 08/02/2012  PHQ - 2 Score 0 0 0 0     Cognitive Function        Immunization History  Administered Date(s) Administered  . Influenza Split 05/12/2011, 04/15/2012  . Influenza Whole 06/07/2007, 04/28/2008, 05/25/2009, 06/28/2010  . Influenza, High Dose Seasonal PF 06/12/2016  . Influenza,inj,Quad PF,36+ Mos 05/30/2013, 04/20/2014, 05/31/2015  . Pneumococcal Conjugate-13 04/20/2014  . Pneumococcal Polysaccharide-23 04/05/2007, 04/15/2012  . Td 04/05/2007  . Tdap 08/04/2016  . Zoster 11/05/2007   Screening Tests Health Maintenance  Topic Date Due  . COLONOSCOPY  06/25/2017  . TETANUS/TDAP  08/04/2026  . INFLUENZA VACCINE  Completed  . DEXA SCAN  Completed  . ZOSTAVAX  Completed  . PNA vac Low Risk Adult  Completed      Plan:   *** During the course of the visit the patient was educated and counseled about the following appropriate screening and preventive services:   Vaccines to include Pneumoccal, Influenza, Hepatitis B, Td, Zostavax, HCV  Electrocardiogram  Cardiovascular Disease  Colorectal cancer screening  Bone density screening  Diabetes screening  Glaucoma screening  Mammography/PAP  Nutrition counseling   Patient Instructions (the written plan) was given to the patient.   Shela Nevin, [place]  08/05/2016

## 2016-08-05 NOTE — Progress Notes (Deleted)
Pre visit review using our clinic review tool, if applicable. No additional management support is needed unless otherwise documented below in the visit note. 

## 2016-08-07 ENCOUNTER — Encounter: Payer: Medicare Other | Admitting: Internal Medicine

## 2016-08-12 ENCOUNTER — Encounter: Payer: Self-pay | Admitting: Internal Medicine

## 2016-08-12 ENCOUNTER — Ambulatory Visit (INDEPENDENT_AMBULATORY_CARE_PROVIDER_SITE_OTHER): Payer: Medicare Other | Admitting: Internal Medicine

## 2016-08-12 VITALS — BP 128/78 | HR 82 | Temp 98.0°F | Resp 14 | Ht 64.0 in | Wt 130.0 lb

## 2016-08-12 DIAGNOSIS — J441 Chronic obstructive pulmonary disease with (acute) exacerbation: Secondary | ICD-10-CM

## 2016-08-12 DIAGNOSIS — S0990XD Unspecified injury of head, subsequent encounter: Secondary | ICD-10-CM | POA: Diagnosis not present

## 2016-08-12 MED ORDER — PREDNISONE 10 MG PO TABS: ORAL_TABLET | ORAL | 0 refills | Status: DC

## 2016-08-12 NOTE — Assessment & Plan Note (Signed)
Fall: s/p  a fall and scalp laceration in the context of EtOH intoxication. Patient reports she does not drink daily or heavy, I don't believe she has a EtOH problem. She is advised to avoid more episode like this one. Scalp laceration: Staples removed without problems, recommend to keep the area clean and dry, apply pressure if bleeding and call if signs of infections. COPD exacerbation: See last OV, s/p Z-Pak, not feeling completely well. Denies fever chills. Will recommend a second round of steroids, continue Astelin, Flonase and Mucinex DM. RTC for a CPX at her earliest convenience

## 2016-08-12 NOTE — Patient Instructions (Addendum)
Continue Flonase, Astelin, Mucinex DM  Take a second round of prednisone  Call if you are not back to normal within 10 days  Please scheduled a complete physical exam at your earliest convenience

## 2016-08-12 NOTE — Progress Notes (Signed)
Pre visit review using our clinic review tool, if applicable. No additional management support is needed unless otherwise documented below in the visit note. 

## 2016-08-12 NOTE — Progress Notes (Signed)
Subjective:    Patient ID: Mary Jordan, female    DOB: 08-10-1935, 82 y.o.   MRN: [id number]  DOS:  08/12/2016 Type of visit - description : ER f/u Interval history: Went to the ER 08/04/2016. She had a fall, injured her head. EMS arrived, she was somewhat agitated, received lorazepam. She was drinking alcohol before the incident. Labs: EtOH +, electrolytes and CBC okay, CT head and neck: No acute changes   Review of Systems Since he left the ER, she is feeling better. Was a slightly dizzy for 2 days but that's gone. No headache per se, still sore at the side of the head impact. No nausea vomiting or diplopia. No neck pain Also, was seen with a COPD exacerbation 07-25-16, overall is better but she continue with cough and nasal discharge with white sputum. No fever chills. No fever chills    Past Medical History:  Diagnosis Date  . Allergic rhinitis   . Anemia   . B12 deficiency   . COPD (chronic obstructive pulmonary disease) ([place])   . Hyperlipidemia   . Insomnia   . Lung mass    s/p excision of RUL benign 2007  . Menopause   . Osteoarthritis   . Osteoporosis     Past Surgical History:  Procedure Laterality Date  . ABDOMINAL HYSTERECTOMY    . BREAST BIOPSY     remotely, neg  . CARDIOVASCULAR STRESS TEST  2007   Cardiolite negative  . CATARACT EXTRACTION, BILATERAL    . lesion from lung excised, benign  05-2006    Social History   Social History  . Marital status: Divorced    Spouse name: N/A  . Number of children: 2  . Years of education: N/A   Occupational History  . Retired Retired    Network engineer   Social History Main Topics  . Smoking status: Former Smoker    Packs/day: 1.00    Years: 39.00    Types: Cigarettes    Quit date: 08/11/1996  . Smokeless tobacco: Never Used     Comment:    . Alcohol use 3.6 oz/week    3 Cans of beer, 3 Glasses of wine per week     Comment: three times weekly  . Drug use: No  . Sexual activity: Not on file   Other  Topics Concern  . Not on file   Social History Narrative   Lives by herself    Has 2 children, 4 gkids all boys              Allergies as of 08/12/2016      Reactions   Levofloxacin Hives      Medication List       Accurate as of 08/12/16  5:02 PM. Always use your most recent med list.          albuterol 108 (90 Base) MCG/ACT inhaler Commonly known as:  PROVENTIL HFA;VENTOLIN HFA Inhale 1-2 puffs into the lungs every 6 (six) hours as needed for wheezing. Patient prefers Ventolin brand inhaler.   azelastine 0.1 % nasal spray Commonly known as:  ASTELIN Place 2 sprays into both nostrils 2 (two) times daily.   budesonide-formoterol 160-4.5 MCG/ACT inhaler Commonly known as:  SYMBICORT Inhale 2 puffs into the lungs 2 (two) times daily.   calcium-vitamin D 250-100 MG-UNIT tablet Take 1 tablet by mouth 2 (two) times daily.   cetirizine 10 MG tablet Commonly known as:  ZYRTEC Take 10 mg by mouth daily.  clorazepate 3.75 MG tablet Commonly known as:  TRANXENE Take 1 tablet (3.75 mg total) by mouth at bedtime as needed for sleep.   predniSONE 10 MG tablet Commonly known as:  DELTASONE 3 tabs x 3 days, 2 tabs x 3 days, 1 tab x 3 days   simvastatin 10 MG tablet Commonly known as:  ZOCOR Take 1 tablet (10 mg total) by mouth at bedtime.   vitamin E 400 UNIT capsule Generic drug:  vitamin E Take 400 Units by mouth daily.          Objective:   Physical Exam BP 128/78 (BP Location: Left Arm, Patient Position: Sitting, Cuff Size: Small)   Pulse 82   Temp 98 F (36.7 C) (Oral)   Resp 14   Ht 5\' 4"  (1.626 m)   Wt 130 lb (59 kg)   SpO2 95%   BMI 22.31 kg/m  General:   Well developed, well nourished . NAD.  HEENT:  Normocephalic . Face symmetric, atraumatic. Nose is slightly congested, sinuses no TTP. Throat symmetric Neck: ROM Wnl Lungs:  Increase breath sounds otherwise clear B Normal respiratory effort, no intercostal retractions, no accessory muscle  use. Heart: RRR,  no murmur.  No pretibial edema bilaterally  Skin: Scalp with a healed laceration, 2 staples in place Neurologic:  alert & oriented X3.  Speech normal, gait appropriate for age and unassisted Psych--  Cognition and judgment appear intact.  Cooperative with normal attention span and concentration.  Behavior appropriate. No anxious or depressed appearing.      Assessment & Plan:   Assessment Hyperlipidemia Insomnia: Chronic, mild COPD B12 deficiency- B12 shots elsewhere Osteoporosis : h/o wrist Fx, T score -2.2  (2013), -2.1 (03-2015) Intolerant to fosamax, actonel $$, took reclast before ~2013 DJD Menopausal Lung mass RUL, s/p excision benign 2007  PLAN Fall: s/p  a fall and scalp laceration in the context of EtOH intoxication. Patient reports she does not drink daily or heavy, I don't believe she has a EtOH problem. She is advised to avoid more episode like this one. Scalp laceration: Staples removed without problems, recommend to keep the area clean and dry, apply pressure if bleeding and call if signs of infections. COPD exacerbation: See last OV, s/p Z-Pak, not feeling completely well. Denies fever chills. Will recommend a second round of steroids, continue Astelin, Flonase and Mucinex DM. RTC for a CPX at her earliest convenience

## 2016-10-07 ENCOUNTER — Telehealth: Payer: Self-pay | Admitting: Internal Medicine

## 2016-10-07 NOTE — Telephone Encounter (Signed)
Patient scheduled AWV with PCP 11/13/16.

## 2016-10-10 ENCOUNTER — Telehealth: Payer: Self-pay

## 2016-10-10 MED ORDER — CLORAZEPATE DIPOTASSIUM 3.75 MG PO TABS: 3.7500 mg | ORAL_TABLET | Freq: Every evening | ORAL | 4 refills | Status: DC | PRN

## 2016-10-10 MED ORDER — SIMVASTATIN 10 MG PO TABS: 10.0000 mg | ORAL_TABLET | Freq: Every day | ORAL | 5 refills | Status: DC

## 2016-10-10 NOTE — Telephone Encounter (Signed)
Pt is requesting refill on Clorazepate (Tranxene).  Last OV: 08/12/2016 Last Fill: 02/25/2016 #30 and 4RF UDS: Unable to test for Clorazepate  Please advise.

## 2016-10-10 NOTE — Telephone Encounter (Signed)
Rx printed, awaiting MD signature.  

## 2016-10-10 NOTE — Telephone Encounter (Signed)
Okay #30 and 4 refills 

## 2016-10-10 NOTE — Telephone Encounter (Signed)
Rx faxed to [facility].  

## 2016-11-13 ENCOUNTER — Encounter: Payer: Self-pay | Admitting: Internal Medicine

## 2016-11-13 ENCOUNTER — Ambulatory Visit (INDEPENDENT_AMBULATORY_CARE_PROVIDER_SITE_OTHER): Payer: Medicare Other | Admitting: Internal Medicine

## 2016-11-13 VITALS — BP 126/66 | HR 67 | Temp 98.1°F | Resp 14 | Ht 64.0 in | Wt 133.1 lb

## 2016-11-13 DIAGNOSIS — E785 Hyperlipidemia, unspecified: Secondary | ICD-10-CM | POA: Diagnosis not present

## 2016-11-13 DIAGNOSIS — J449 Chronic obstructive pulmonary disease, unspecified: Secondary | ICD-10-CM

## 2016-11-13 DIAGNOSIS — Z0000 Encounter for general adult medical examination without abnormal findings: Secondary | ICD-10-CM

## 2016-11-13 DIAGNOSIS — J358 Other chronic diseases of tonsils and adenoids: Secondary | ICD-10-CM

## 2016-11-13 DIAGNOSIS — M810 Age-related osteoporosis without current pathological fracture: Secondary | ICD-10-CM | POA: Diagnosis not present

## 2016-11-13 LAB — COMPREHENSIVE METABOLIC PANEL
ALT: 18 U/L (ref 0–35)
AST: 19 U/L (ref 0–37)
Albumin: 4.4 g/dL (ref 3.5–5.2)
Alkaline Phosphatase: 59 U/L (ref 39–117)
BILIRUBIN TOTAL: 0.3 mg/dL (ref 0.2–1.2)
BUN: 10 mg/dL (ref 6–23)
CALCIUM: 9.5 mg/dL (ref 8.4–10.5)
CHLORIDE: 104 meq/L (ref 96–112)
CO2: 28 meq/L (ref 19–32)
Creatinine, Ser: 0.59 mg/dL (ref 0.40–1.20)
GFR: 104.35 mL/min (ref >60.00)
Glucose, Bld: 91 mg/dL (ref 70–99)
Potassium: 3.7 mEq/L (ref 3.5–5.1)
Sodium: 138 mEq/L (ref 135–145)
Total Protein: 7.3 g/dL (ref 6.0–8.3)

## 2016-11-13 LAB — CBC WITH DIFFERENTIAL/PLATELET
BASOS ABS: 0.1 10*3/uL (ref 0.0–0.1)
Basophils Relative: 1.2 % (ref 0.0–3.0)
Eosinophils Absolute: 0.2 10*3/uL (ref 0.0–0.7)
Eosinophils Relative: 2.9 % (ref 0.0–5.0)
HCT: 44.7 % (ref 36.0–46.0)
Hemoglobin: 14.9 g/dL (ref 12.0–15.0)
LYMPHS ABS: 1.4 10*3/uL (ref 0.7–4.0)
Lymphocytes Relative: 20.5 % (ref 12.0–46.0)
MCHC: 33.2 g/dL (ref 30.0–36.0)
MCV: 89.7 fl (ref 78.0–100.0)
MONOS PCT: 8.2 % (ref 3.0–12.0)
Monocytes Absolute: 0.6 10*3/uL (ref 0.1–1.0)
NEUTROS ABS: 4.7 10*3/uL (ref 1.4–7.7)
Neutrophils Relative %: 67.2 % (ref 43.0–77.0)
PLATELETS: 366 10*3/uL (ref 150.0–400.0)
RBC: 4.99 Mil/uL (ref 3.87–5.11)
RDW: 14 % (ref 11.5–15.5)
WBC: 7 10*3/uL (ref 4.0–10.5)

## 2016-11-13 LAB — LIPID PANEL
CHOL/HDL RATIO: 3
Cholesterol: 204 mg/dL — ABNORMAL HIGH (ref 0–200)
HDL: 64.6 mg/dL (ref >39.00)
LDL Cholesterol: 122 mg/dL — ABNORMAL HIGH (ref 0–99)
NonHDL: 139.88
TRIGLYCERIDES: 89 mg/dL (ref 0.0–149.0)
VLDL: 17.8 mg/dL (ref 0.0–40.0)

## 2016-11-13 LAB — TSH: TSH: 2.18 u[IU]/mL (ref 0.35–4.50)

## 2016-11-13 NOTE — Progress Notes (Signed)
Subjective:    Patient ID: Mary Jordan, female    DOB: March 22, 1935, 82 y.o.   MRN: [id number]  DOS:  11/13/2016 Type of visit - description : rov Interval history: High cholesterol: Good med compliance   Copd : Slightly worse, has noted increased difficulty breathing. Uses rescue inhaler infrequently, cough is mild and at baseline. No much mucus production. Insomnia: Well-controlled with medications as needed. We discussed also breast and colon  Cancer screening  Review of Systems No fever chills. No chest pain or hemoptysis No nausea, vomiting, diarrhea. She has a history of allergies,  denies any itchy nose or eyes.  Past Medical History:  Diagnosis Date  . Allergic rhinitis   . Anemia   . B12 deficiency   . COPD (chronic obstructive pulmonary disease) ([place])   . Hyperlipidemia   . Insomnia   . Lung mass    s/p excision of RUL benign 2007  . Menopause   . Osteoarthritis   . Osteoporosis     Past Surgical History:  Procedure Laterality Date  . ABDOMINAL HYSTERECTOMY    . BREAST BIOPSY     remotely, neg  . CARDIOVASCULAR STRESS TEST  2007   Cardiolite negative  . CATARACT EXTRACTION, BILATERAL    . lesion from lung excised, benign  05-2006    Social History   Social History  . Marital status: Divorced    Spouse name: N/A  . Number of children: 2  . Years of education: N/A   Occupational History  . Retired Retired    Network engineer   Social History Main Topics  . Smoking status: Former Smoker    Packs/day: 1.00    Years: 39.00    Types: Cigarettes    Quit date: 08/11/1996  . Smokeless tobacco: Never Used     Comment:    . Alcohol use 3.6 oz/week    3 Cans of beer, 3 Glasses of wine per week     Comment: three times weekly  . Drug use: No  . Sexual activity: Not on file   Other Topics Concern  . Not on file   Social History Narrative   Lives by herself    Has 2 children, 4 gkids all boys              Allergies as of 11/13/2016      Reactions     Levofloxacin Hives      Medication List       Accurate as of 11/13/16 11:59 PM. Always use your most recent med list.          albuterol 108 (90 Base) MCG/ACT inhaler Commonly known as:  PROVENTIL HFA;VENTOLIN HFA Inhale 1-2 puffs into the lungs every 6 (six) hours as needed for wheezing. Patient prefers Ventolin brand inhaler.   azelastine 0.1 % nasal spray Commonly known as:  ASTELIN Place 2 sprays into both nostrils 2 (two) times daily.   budesonide-formoterol 160-4.5 MCG/ACT inhaler Commonly known as:  SYMBICORT Inhale 2 puffs into the lungs 2 (two) times daily.   calcium-vitamin D 250-100 MG-UNIT tablet Take 1 tablet by mouth 2 (two) times daily.   cetirizine 10 MG tablet Commonly known as:  ZYRTEC Take 10 mg by mouth daily.   clorazepate 3.75 MG tablet Commonly known as:  TRANXENE Take 1 tablet (3.75 mg total) by mouth at bedtime as needed for sleep.   simvastatin 10 MG tablet Commonly known as:  ZOCOR Take 1 tablet (10 mg total) by mouth  at bedtime.   vitamin E 400 UNIT capsule Generic drug:  vitamin E Take 400 Units by mouth daily.          Objective:   Physical Exam BP 126/66 (BP Location: Left Arm, Patient Position: Sitting, Cuff Size: Small)   Pulse 67   Temp 98.1 F (36.7 C) (Oral)   Resp 14   Ht 5\' 4"  (1.626 m)   Wt 133 lb 2 oz (60.4 kg)   SpO2 97%   BMI 22.85 kg/m  General:   Well developed, well nourished . NAD Neck: No thyromegaly, no supraclavicular mass.  HEENT:  Normocephalic . Face symmetric, atraumatic Lungs:  Increase breath sounds Normal respiratory effort, no intercostal retractions, no accessory muscle use. Heart: RRR,  no murmur.  no pretibial edema bilaterally  Abdomen:  Not distended, soft, non-tender. No rebound or rigidity.  Skin: Not pale. Not jaundice Neurologic:  alert & oriented X3.  Speech normal, gait appropriate for age and unassisted Psych--  Cognition and judgment appear intact.  Cooperative with  normal attention span and concentration.  Behavior appropriate. No anxious or depressed appearing.    Assessment & Plan:  Assessment Hyperlipidemia Insomnia: Chronic, mild COPD B12 deficiency- B12 shots elsewhere Osteoporosis : h/o wrist Fx, T score -2.2  (2013), -2.1 (03-2015) Intolerant to fosamax, actonel $$, took reclast before ~2013 DJD Menopausal Lung mass RUL, s/p excision benign 2007  PLAN Hyperlipidemia: On simvastatin, check a CMP, TSH and FLP. COPD: no much much cough, sputum production or wheezing just SOB. Refer to pulmonary, likely needs a long acting anticholinergic. Osteoporosis: Declined further treatment Insomnia: Refill medications as needed At the end of the visit asked me to check her throat, has a small tonsil on the L, h/o remote tonsillectomy, refer to ENT RTC 6 months

## 2016-11-13 NOTE — Patient Instructions (Addendum)
GO TO THE LAB : Get the blood work     GO TO THE FRONT DESK Schedule your next appointment for a  checkup in 6 months  We refer you to the pulmonologist  Please schedule Medicare wellness with one of our are RNs at your convenience

## 2016-11-13 NOTE — Progress Notes (Signed)
Pre visit review using our clinic review tool, if applicable. No additional management support is needed unless otherwise documented below in the visit note. 

## 2016-11-13 NOTE — Assessment & Plan Note (Addendum)
Recommend a medicare wellness today, we did discussed the following: Colon ca screening Cscope 03-2008 Dr Olevia Perches 2 polyps (Hyperplastic and tubular adenoma) , colonoscopy again 11-15- 2013, 1 polyp.  Does not desire more cscopes Breast Ca screening: mother had breast ca in her 32, pt never had a abnormal mmg-bx; ok to d/c screening if so desire

## 2016-11-14 NOTE — Assessment & Plan Note (Signed)
  Hyperlipidemia: On simvastatin, check a CMP, TSH and FLP. COPD: no much much cough, sputum production or wheezing just SOB. Refer to pulmonary, likely needs a long acting anticholinergic. Osteoporosis: Declined further treatment Insomnia: Refill medications as needed At the end of the visit asked me to check her throat, has a small tonsil on the L, h/o remote tonsillectomy, refer to ENT RTC 6 months

## 2016-11-26 DIAGNOSIS — K219 Gastro-esophageal reflux disease without esophagitis: Secondary | ICD-10-CM | POA: Insufficient documentation

## 2016-12-18 ENCOUNTER — Encounter: Payer: Self-pay | Admitting: Pulmonary Disease

## 2016-12-18 ENCOUNTER — Ambulatory Visit (INDEPENDENT_AMBULATORY_CARE_PROVIDER_SITE_OTHER): Payer: Medicare Other | Admitting: Pulmonary Disease

## 2016-12-18 DIAGNOSIS — J449 Chronic obstructive pulmonary disease, unspecified: Secondary | ICD-10-CM | POA: Diagnosis not present

## 2016-12-18 MED ORDER — BUDESONIDE-FORMOTEROL FUMARATE 160-4.5 MCG/ACT IN AERO: 2.0000 | INHALATION_SPRAY | Freq: Two times a day (BID) | RESPIRATORY_TRACT | 0 refills | Status: DC

## 2016-12-18 NOTE — Patient Instructions (Signed)
Sample of Symbicort Call us if breathing gets worse or wheezing or if her using her rescue inhaler more than 4 times per day  Continue to stay active. Recheck blood pressure

## 2016-12-18 NOTE — Assessment & Plan Note (Signed)
Sample of Symbicort Call us if breathing gets worse or wheezing or if her using her rescue inhaler more than 4 times per day  Continue to stay active.

## 2016-12-18 NOTE — Assessment & Plan Note (Signed)
Related to exertion today. Recheck blood pressure

## 2016-12-18 NOTE — Addendum Note (Signed)
Addended by: Valerie Salts on: 12/18/2016 03:27 PM   Modules accepted: Orders

## 2016-12-18 NOTE — Progress Notes (Signed)
   Subjective:    Patient ID: Mary Jordan, female    DOB: 1935-06-07, 82 y.o.   MRN: [id number]  HPI  82 yo female former smoker  With COPD -GOLD C    Chief Complaint  Patient presents with  . Follow-up    6 month f/u. Denies any breathing issues.     Breathing has been stable, no hospital visits or year visits. Her friend required a nebulizer machine and she inquires about that. She took the stairs up and her blood pressure is high, she is mildly tachycardic to 105 Denies sputum production or wheezing. Depending on the weather sometimes she needs her rescue inhaler more frequently. In the past, we give her a trial of anoro which she did not persist with this and is back to using Symbicort sample of Symbicort   Significant tests/ events reviewed  FeV1 57% 04/2012 11/2015 Spirometry fev1 54%   Review of Systems Patient denies significant dyspnea,cough, hemoptysis,  chest pain, palpitations, pedal edema, orthopnea, paroxysmal nocturnal dyspnea, lightheadedness, nausea, vomiting, abdominal or  leg pains      Objective:   Physical Exam  Gen. Pleasant, well-nourished, in no distress ENT - no thrush, no post nasal drip Neck: No JVD, no thyromegaly, no carotid bruits Lungs: no use of accessory muscles, no dullness to percussion, decreased without rales or rhonchi  Cardiovascular: Rhythm regular, heart sounds  normal, no murmurs or gallops, no peripheral edema Musculoskeletal: No deformities, no cyanosis or clubbing        Assessment & Plan:

## 2017-03-19 ENCOUNTER — Other Ambulatory Visit: Payer: Self-pay

## 2017-03-19 MED ORDER — BUDESONIDE-FORMOTEROL FUMARATE 160-4.5 MCG/ACT IN AERO: 2.0000 | INHALATION_SPRAY | Freq: Two times a day (BID) | RESPIRATORY_TRACT | 5 refills | Status: DC

## 2017-05-04 ENCOUNTER — Other Ambulatory Visit: Payer: Self-pay | Admitting: Internal Medicine

## 2017-05-04 DIAGNOSIS — Z1231 Encounter for screening mammogram for malignant neoplasm of breast: Secondary | ICD-10-CM

## 2017-05-04 DIAGNOSIS — Z23 Encounter for immunization: Secondary | ICD-10-CM | POA: Diagnosis not present

## 2017-05-04 NOTE — Telephone Encounter (Signed)
Pt is requesting refill on clorazepate (Tranxene) 3.75mg .  Last OV: 11/13/2016 Last Fill: 10/10/2016 #30 and 4RF  Okay to refill?

## 2017-05-04 NOTE — Telephone Encounter (Signed)
Rx printed, awaiting MD signature.  

## 2017-05-04 NOTE — Telephone Encounter (Signed)
Okay #30 and one refill 

## 2017-05-04 NOTE — Telephone Encounter (Signed)
Rx faxed to [facility].

## 2017-05-11 ENCOUNTER — Ambulatory Visit (HOSPITAL_BASED_OUTPATIENT_CLINIC_OR_DEPARTMENT_OTHER)
Admission: RE | Admit: 2017-05-11 | Discharge: 2017-05-11 | Disposition: A | Discharge status: Home / Self Care | Payer: Medicare Other | Source: Ambulatory Visit | Attending: Internal Medicine | Admitting: Internal Medicine

## 2017-05-11 DIAGNOSIS — Z1231 Encounter for screening mammogram for malignant neoplasm of breast: Secondary | ICD-10-CM | POA: Diagnosis not present

## 2017-05-14 ENCOUNTER — Other Ambulatory Visit: Payer: Self-pay | Admitting: Internal Medicine

## 2017-05-14 ENCOUNTER — Encounter: Payer: Self-pay | Admitting: Internal Medicine

## 2017-05-14 ENCOUNTER — Ambulatory Visit (INDEPENDENT_AMBULATORY_CARE_PROVIDER_SITE_OTHER): Payer: Medicare Other | Admitting: Internal Medicine

## 2017-05-14 ENCOUNTER — Telehealth: Payer: Self-pay | Admitting: Internal Medicine

## 2017-05-14 VITALS — BP 116/74 | HR 76 | Temp 97.4°F | Resp 14 | Ht 64.0 in | Wt 130.5 lb

## 2017-05-14 DIAGNOSIS — J449 Chronic obstructive pulmonary disease, unspecified: Secondary | ICD-10-CM

## 2017-05-14 DIAGNOSIS — R928 Other abnormal and inconclusive findings on diagnostic imaging of breast: Secondary | ICD-10-CM

## 2017-05-14 DIAGNOSIS — K5900 Constipation, unspecified: Secondary | ICD-10-CM

## 2017-05-14 MED ORDER — ZOSTER VAC RECOMB ADJUVANTED 50 MCG/0.5ML IM SUSR: 0.5000 mL | Freq: Once | INTRAMUSCULAR | 1 refills | Status: AC

## 2017-05-14 NOTE — Patient Instructions (Signed)
GO TO THE FRONT DESK Schedule your next appointment for a  yearly checkup by April 2019. Also consider Medicare wellness visit with one of our nurses  For constipation  Drink plenty of water  MiraLAX 17 g mixed with water every day for 2 weeks, then as needed  Okay to   milk of magnesia or use a glycerin suppository  from time to time  Avoid calcium supplements for several days  Call if not improving, call if severe symptoms, nausea, vomiting, blood in the stools

## 2017-05-14 NOTE — Progress Notes (Signed)
Subjective:    Patient ID: Mary Jordan, female    DOB: 22-Feb-1935, 82 y.o.   MRN: [id number]  DOS:  05/14/2017 Type of visit - description : f/u Interval history: In general feeling well. Good compliance of medication. She did report constipation for the last 2 weeks. To have a bowel movement she needs to use milk of magnesia or a suppository, even with those measures, BMs are not  satisfactory. She is not taking any new medicines however she is taking a different brand of calcium supplements Respiratory sx at baseline   Review of Systems  no fever, chills No nausea, vomiting, blood in stools   Past Medical History:  Diagnosis Date  . Allergic rhinitis   . Anemia   . B12 deficiency   . COPD (chronic obstructive pulmonary disease) ([place])   . Hyperlipidemia   . Insomnia   . Laryngopharyngeal reflux (LPR)   . Lung mass    s/p excision of RUL benign 2007  . Menopause   . Osteoarthritis   . Osteoporosis     Past Surgical History:  Procedure Laterality Date  . ABDOMINAL HYSTERECTOMY    . BREAST BIOPSY     remotely, neg  . CARDIOVASCULAR STRESS TEST  2007   Cardiolite negative  . CATARACT EXTRACTION, BILATERAL    . lesion from lung excised, benign  05-2006    Social History   Social History  . Marital status: Divorced    Spouse name: N/A  . Number of children: 2  . Years of education: N/A   Occupational History  . Retired Retired    Network engineer   Social History Main Topics  . Smoking status: Former Smoker    Packs/day: 1.00    Years: 39.00    Types: Cigarettes    Quit date: 08/11/1996  . Smokeless tobacco: Never Used     Comment:    . Alcohol use 3.6 oz/week    3 Cans of beer, 3 Glasses of wine per week     Comment: three times weekly  . Drug use: No  . Sexual activity: Not on file   Other Topics Concern  . Not on file   Social History Narrative   Lives by herself    Has 2 children, 4 gkids all boys              Allergies as of 05/14/2017      Reactions   Levofloxacin Hives      Medication List       Accurate as of 05/14/17 11:59 PM. Always use your most recent med list.          albuterol 108 (90 Base) MCG/ACT inhaler Commonly known as:  PROVENTIL HFA;VENTOLIN HFA Inhale 1-2 puffs into the lungs every 6 (six) hours as needed for wheezing. Patient prefers Ventolin brand inhaler.   budesonide-formoterol 160-4.5 MCG/ACT inhaler Commonly known as:  SYMBICORT Inhale 2 puffs into the lungs 2 (two) times daily.   calcium-vitamin D 250-100 MG-UNIT tablet Take 1 tablet by mouth 2 (two) times daily.   cetirizine 10 MG tablet Commonly known as:  ZYRTEC Take 10 mg by mouth daily.   clorazepate 3.75 MG tablet Commonly known as:  TRANXENE Take 1 tablet (3.75 mg total) by mouth at bedtime as needed for sleep.   simvastatin 10 MG tablet Commonly known as:  ZOCOR Take 1 tablet (10 mg total) by mouth at bedtime.   vitamin E 400 UNIT capsule Generic drug:  vitamin E  Take 400 Units by mouth daily.   Zoster Vac Recomb Adjuvanted injection Commonly known as:  SHINGRIX Inject 0.5 mLs into the muscle once.          Objective:   Physical Exam BP 116/74 (BP Location: Left Arm, Patient Position: Sitting, Cuff Size: Small)   Pulse 76   Temp (!) 97.4 F (36.3 C) (Oral)   Resp 14   Ht 5\' 4"  (1.626 m)   Wt 130 lb 8 oz (59.2 kg)   SpO2 96%   BMI 22.40 kg/m  General:   Well developed, well nourished . NAD.  HEENT:  Normocephalic . Face symmetric, atraumatic Lungs:   decreased breath sounds but clear Normal respiratory effort, no intercostal retractions, no accessory muscle use. Heart: RRR,  no murmur.  No pretibial edema bilaterally  Abdomen: Soft, nontender, good bowel sounds. No mass or rebound Skin: Not pale. Not jaundice Neurologic:  alert & oriented X3.  Speech normal, gait appropriate for age and unassisted Psych--  Cognition and judgment appear intact.  Cooperative with normal attention span and  concentration.  Behavior appropriate. No anxious or depressed appearing.      Assessment & Plan:   Assessment Hyperlipidemia Insomnia: Chronic, mild COPD B12 deficiency- B12 shots elsewhere Osteoporosis : h/o wrist Fx, T score -2.2  (2013), -2.1 (03-2015) Intolerant to fosamax, actonel $$, took reclast before ~2013 DJD Menopausal Lung mass RUL, s/p excision benign 2007  PLAN Constipation: No red flag symptoms, recommend MiraLAX daily for 2 weeks then prn, okay MOM or glycerine suppositories prn. Sxs started after changed her calcium supplement brand. Suggest to go back to the old brand or simply take vitamin D without calcium. See instructions. COPD: Saw pulmonary 12/2016, no change recommend. At this point she is satisfied with Symbicort. Had a flu shot already Request a shingles prescription RTC CPX 11/2017

## 2017-05-14 NOTE — Telephone Encounter (Signed)
Pt called in, she said that she was just seen by provider and have a quick question for assistant.   CB: [phone]

## 2017-05-14 NOTE — Telephone Encounter (Signed)
Tried calling Pt, line busy. Will try again later.  

## 2017-05-14 NOTE — Progress Notes (Signed)
Pre visit review using our clinic review tool, if applicable. No additional management support is needed unless otherwise documented below in the visit note. 

## 2017-05-15 NOTE — Assessment & Plan Note (Signed)
Constipation: No red flag symptoms, recommend MiraLAX daily for 2 weeks then prn, okay MOM or glycerine suppositories prn. Sxs started after changed her calcium supplement brand. Suggest to go back to the old brand or simply take vitamin D without calcium. See instructions. COPD: Saw pulmonary 12/2016, no change recommend. At this point she is satisfied with Symbicort. Had a flu shot already Request a shingles prescription RTC CPX 11/2017

## 2017-05-18 ENCOUNTER — Telehealth: Payer: Self-pay | Admitting: *Deleted

## 2017-05-18 NOTE — Telephone Encounter (Signed)
Received Physician Orders from [place]; forwarded to provider/SLS 10/08

## 2017-05-20 NOTE — Telephone Encounter (Signed)
Orders signed and faxed to [facility] at [phone]. Form sent for scanning.

## 2017-06-01 ENCOUNTER — Ambulatory Visit
Admission: RE | Admit: 2017-06-01 | Discharge: 2017-06-01 | Disposition: A | Discharge status: Home / Self Care | Payer: Medicare Other | Source: Ambulatory Visit | Attending: Internal Medicine | Admitting: Internal Medicine

## 2017-06-01 ENCOUNTER — Ambulatory Visit: Admission: RE | Admit: 2017-06-01 | Payer: Medicare Other | Source: Ambulatory Visit

## 2017-06-01 DIAGNOSIS — R928 Other abnormal and inconclusive findings on diagnostic imaging of breast: Secondary | ICD-10-CM | POA: Diagnosis not present

## 2017-06-18 ENCOUNTER — Encounter: Payer: Self-pay | Admitting: Pulmonary Disease

## 2017-06-18 ENCOUNTER — Ambulatory Visit (INDEPENDENT_AMBULATORY_CARE_PROVIDER_SITE_OTHER): Payer: Medicare Other | Admitting: Pulmonary Disease

## 2017-06-18 VITALS — BP 144/76 | HR 83 | Ht 64.0 in | Wt 130.0 lb

## 2017-06-18 DIAGNOSIS — J312 Chronic pharyngitis: Secondary | ICD-10-CM | POA: Diagnosis not present

## 2017-06-18 DIAGNOSIS — J449 Chronic obstructive pulmonary disease, unspecified: Secondary | ICD-10-CM | POA: Diagnosis not present

## 2017-06-18 NOTE — Patient Instructions (Signed)
COPD: Continue Symbicort 2 puffs twice a day no matter how you feel Use albuterol as needed for chest tightness wheezing or shortness of breath I am glad he had a flu shot Stay active, exercise regularly Follow-up with [place] in 6 months  Sore throat: This is probably either related to the dry throat you have been having or it could be related to the Symbicort My recommendation is that you start taking Flonase 2 puffs each nostril daily, the generic form is fine After 2 weeks of taking Flonase stop taking Zyrtec but continue taking Flonase If you are still having trouble with a sore throat in a month call us and we can call in a prescription for an antifungal medicine to see if this helps (sometimes people will get yeast on the throat related to Symbicort)  We will see you back in 6 months or sooner if needed

## 2017-06-18 NOTE — Progress Notes (Signed)
Subjective:    Patient ID: Mary Jordan, female    DOB: [birth year], 82 y.o.   MRN: [id number]  Synopsis: Followed by Dr. Elsworth Soho for COPD. She used to smoke cigarettes, quit in her 71's  HPI Chief Complaint  Patient presents with  . Follow-up    RA pt seeing BQ d/t location- 6 month rov for COPD. pt has good and bad days, some SOB with exertion.  CAT: 8.    Mary Jordan has COPD > she has good days and bad days > sometimes if the allergies are flaring  > vacuuming makes her feel more short of breath > she says taht heavy exertion makes her more short of breath > she took two flights of stairs today and it made her short of breath > she uses symbicort regularly > she rinses her mouth frequently after using her Symbicort.   She exercises regularly, going to the [facility]. > she does water aerobics  She notes allergies: > sinus congestion  She says that she frequently gets the flu > the last episode was last year > she had a flu shot this year from a pharmacy  Past Medical History:  Diagnosis Date  . Allergic rhinitis   . Anemia   . B12 deficiency   . COPD (chronic obstructive pulmonary disease) ([place])   . Hyperlipidemia   . Insomnia   . Laryngopharyngeal reflux (LPR)   . Lung mass    s/p excision of RUL benign 2007  . Menopause   . Osteoarthritis   . Osteoporosis      Family History  Problem Relation Age of Onset  . Heart attack Father 26  . Dementia Mother   . Breast cancer Mother   . Asthma Mother   . Hyperlipidemia Neg Hx   . Diabetes Neg Hx   . Stroke Neg Hx   . Colon cancer Neg Hx      Social History   Socioeconomic History  . Marital status: Divorced    Spouse name: Not on file  . Number of children: 2  . Years of education: Not on file  . Highest education level: Not on file  Social Needs  . Financial resource strain: Not on file  . Food insecurity - worry: Not on file  . Food insecurity - inability: Not on file  . Transportation needs - medical:  Not on file  . Transportation needs - non-medical: Not on file  Occupational History  . Occupation: Retired    Fish farm manager: RETIRED    CommentChiropodist  Tobacco Use  . Smoking status: Former Smoker    Packs/day: 1.00    Years: 39.00    Pack years: 39.00    Types: Cigarettes    Last attempt to quit: 08/11/1996    Years since quitting: 20.8  . Smokeless tobacco: Never Used  . Tobacco comment:    Substance and Sexual Activity  . Alcohol use: Yes    Alcohol/week: 3.6 oz    Types: 3 Cans of beer, 3 Glasses of wine per week    Comment: three times weekly  . Drug use: No  . Sexual activity: Not on file  Other Topics Concern  . Not on file  Social History Narrative   Lives by herself    Has 2 children, 4 gkids all boys          Allergies  Allergen Reactions  . Levofloxacin Hives     Outpatient Medications Prior to Visit  Medication Sig Dispense  Refill  . albuterol (PROVENTIL HFA;VENTOLIN HFA) 108 (90 Base) MCG/ACT inhaler Inhale 1-2 puffs into the lungs every 6 (six) hours as needed for wheezing. Patient prefers Ventolin brand inhaler. 18 g 5  . budesonide-formoterol (SYMBICORT) 160-4.5 MCG/ACT inhaler Inhale 2 puffs into the lungs 2 (two) times daily. 1 Inhaler 5  . cetirizine (ZYRTEC) 10 MG tablet Take 10 mg by mouth daily.    . clorazepate (TRANXENE) 3.75 MG tablet Take 1 tablet (3.75 mg total) by mouth at bedtime as needed for sleep. 30 tablet 1  . simvastatin (ZOCOR) 10 MG tablet Take 1 tablet (10 mg total) by mouth at bedtime. 30 tablet 5  . calcium-vitamin D 250-100 MG-UNIT tablet Take 1 tablet by mouth 2 (two) times daily.    . vitamin E (VITAMIN E) 400 UNIT capsule Take 400 Units by mouth daily.     No facility-administered medications prior to visit.       Review of Systems  Constitutional: Negative for appetite change, chills, diaphoresis, fatigue and fever.  HENT: Positive for sore throat. Negative for congestion, hearing loss, nosebleeds, postnasal drip,  rhinorrhea, sinus pressure and trouble swallowing.   Eyes: Negative for discharge, redness and visual disturbance.  Respiratory: Positive for shortness of breath. Negative for cough, choking, chest tightness and wheezing.   Cardiovascular: Negative for chest pain and leg swelling.  Gastrointestinal: Negative for abdominal pain, blood in stool, constipation, diarrhea and nausea.  Genitourinary: Negative for dysuria, frequency and hematuria.  Musculoskeletal: Negative for arthralgias, joint swelling, myalgias and neck stiffness.  Skin: Negative for color change, pallor and rash.  Neurological: Positive for facial asymmetry. Negative for dizziness, seizures, speech difficulty, light-headedness, numbness and headaches.  Hematological: Negative for adenopathy. Does not bruise/bleed easily.       Objective:   Physical Exam Vitals:   06/18/17 1403  BP: (!) 144/76  Pulse: 83  SpO2: 96%  Weight: 130 lb (59 kg)  Height: 5\' 4"  (1.626 m)    RA  Gen: well appearing HENT: OP clear, TM's clear, neck supple PULM: CTA B, normal percussion CV: RRR, no mgr, trace edema GI: BS+, soft, nontender Derm: no cyanosis or rash Psyche: normal mood and affect    Record review: last visit with pulmonary May 2018, stable, continued on Symbicort  PFT: FeV1 57% 04/2012 11/2015 Spirometry fev1 54%  Chest imaging: August 2017 chest x-ray showed hyperinflation but no infiltrate   CBC    Component Value Date/Time   WBC 7.0 11/13/2016 1053   RBC 4.99 11/13/2016 1053   HGB 14.9 11/13/2016 1053   HCT 44.7 11/13/2016 1053   PLT 366.0 11/13/2016 1053   MCV 89.7 11/13/2016 1053   MCH 29.2 08/04/2016 2005   MCHC 33.2 11/13/2016 1053   RDW 14.0 11/13/2016 1053   LYMPHSABS 1.4 11/13/2016 1053   MONOABS 0.6 11/13/2016 1053   EOSABS 0.2 11/13/2016 1053   BASOSABS 0.1 11/13/2016 1053        Assessment & Plan:   Chronic obstructive pulmonary disease, unspecified COPD type ([place])  Chronic sore  throat  Discussion: This is a pleasant 82 year old female who has COPD secondary to tobacco use (quit while in her 31s) who comes to our clinic today to continue her management of COPD.  This is been a stable interval for her.  She says that she gets a bad case of bronchitis every year.  I am pleased that she had a flu shot.  She is encouraged to continue practicing good hand hygiene during the  wintertime.  She should continue taking Symbicort.  She does have a complaint of sore throat today.  On physical exam her oropharynx is within normal limits and there is no masses.  I wonder if this is related to the dry mouth she experiences.  I suggested that this could either be related to the clorazepate or her Zyrtec.  I am going to suggest that she try to wean off of the Zyrtec by using nasal fluticasone.  However, if that is not helpful we can try a dose of fluconazole to empirically treat for thrush around the vocal cords.  Plan: COPD: Continue Symbicort 2 puffs twice a day no matter how you feel Use albuterol as needed for chest tightness wheezing or shortness of breath I am glad he had a flu shot Stay active, exercise regularly Follow-up with [place] in 6 months  Sore throat: This is probably either related to the dry throat you have been having or it could be related to the Symbicort My recommendation is that you start taking Flonase 2 puffs each nostril daily, the generic form is fine After 2 weeks of taking Flonase stop taking Zyrtec but continue taking Flonase If you are still having trouble with a sore throat in a month call us and we can call in a prescription for an antifungal medicine to see if this helps (sometimes people will get yeast on the throat related to Symbicort)  We will see you back in 6 months or sooner if needed    Current Outpatient Medications:  .  albuterol (PROVENTIL HFA;VENTOLIN HFA) 108 (90 Base) MCG/ACT inhaler, Inhale 1-2 puffs into the lungs every 6 (six) hours  as needed for wheezing. Patient prefers Ventolin brand inhaler., Disp: 18 g, Rfl: 5 .  budesonide-formoterol (SYMBICORT) 160-4.5 MCG/ACT inhaler, Inhale 2 puffs into the lungs 2 (two) times daily., Disp: 1 Inhaler, Rfl: 5 .  cetirizine (ZYRTEC) 10 MG tablet, Take 10 mg by mouth daily., Disp: , Rfl:  .  clorazepate (TRANXENE) 3.75 MG tablet, Take 1 tablet (3.75 mg total) by mouth at bedtime as needed for sleep., Disp: 30 tablet, Rfl: 1 .  simvastatin (ZOCOR) 10 MG tablet, Take 1 tablet (10 mg total) by mouth at bedtime., Disp: 30 tablet, Rfl: 5

## 2017-06-29 ENCOUNTER — Encounter: Payer: Self-pay | Admitting: Gastroenterology

## 2017-07-07 ENCOUNTER — Other Ambulatory Visit: Payer: Self-pay | Admitting: Adult Health

## 2017-07-07 ENCOUNTER — Other Ambulatory Visit: Payer: Self-pay | Admitting: Internal Medicine

## 2017-07-07 NOTE — Telephone Encounter (Signed)
Ok 30 and 1 RF

## 2017-07-07 NOTE — Telephone Encounter (Signed)
Pt is requesting refill on clorazepate (Tranxene) 3.75mg .  Last OV: 05/14/2017 Last Fill: 05/04/2017 #30 and 1RF    NCCR printed; no issues noted  Please advise.

## 2017-07-07 NOTE — Telephone Encounter (Signed)
Rx printed, awaiting MD signature.  

## 2017-07-07 NOTE — Telephone Encounter (Signed)
Rx faxed to [facility].

## 2017-07-16 ENCOUNTER — Other Ambulatory Visit: Payer: Self-pay

## 2017-08-03 ENCOUNTER — Other Ambulatory Visit: Payer: Self-pay | Admitting: Internal Medicine

## 2017-09-07 DIAGNOSIS — H524 Presbyopia: Secondary | ICD-10-CM | POA: Diagnosis not present

## 2017-09-07 DIAGNOSIS — D4981 Neoplasm of unspecified behavior of retina and choroid: Secondary | ICD-10-CM | POA: Diagnosis not present

## 2017-09-07 DIAGNOSIS — H26491 Other secondary cataract, right eye: Secondary | ICD-10-CM | POA: Diagnosis not present

## 2017-09-07 DIAGNOSIS — Z961 Presence of intraocular lens: Secondary | ICD-10-CM | POA: Diagnosis not present

## 2017-09-07 DIAGNOSIS — H52223 Regular astigmatism, bilateral: Secondary | ICD-10-CM | POA: Diagnosis not present

## 2017-10-15 ENCOUNTER — Other Ambulatory Visit: Payer: Self-pay | Admitting: Pulmonary Disease

## 2017-10-15 ENCOUNTER — Telehealth: Payer: Self-pay | Admitting: Internal Medicine

## 2017-10-15 NOTE — Telephone Encounter (Signed)
Pt requesting refill on clorazepate (Tranxene).   Last OV: 05/14/2017 Last Fill:  07/07/2017 #30 and 1RF UDS: Unable to test for Tranxene  NCCR printed- no issues noted  Please advise.

## 2017-10-15 NOTE — Telephone Encounter (Signed)
Sent!

## 2017-12-11 ENCOUNTER — Encounter: Payer: Self-pay | Admitting: Internal Medicine

## 2017-12-11 ENCOUNTER — Ambulatory Visit (INDEPENDENT_AMBULATORY_CARE_PROVIDER_SITE_OTHER): Payer: Medicare Other | Admitting: Internal Medicine

## 2017-12-11 VITALS — BP 116/74 | HR 71 | Temp 97.5°F | Resp 14 | Ht 64.0 in | Wt 126.5 lb

## 2017-12-11 DIAGNOSIS — B079 Viral wart, unspecified: Secondary | ICD-10-CM

## 2017-12-11 DIAGNOSIS — Z79899 Other long term (current) drug therapy: Secondary | ICD-10-CM

## 2017-12-11 DIAGNOSIS — G4700 Insomnia, unspecified: Secondary | ICD-10-CM | POA: Diagnosis not present

## 2017-12-11 DIAGNOSIS — B078 Other viral warts: Secondary | ICD-10-CM | POA: Diagnosis not present

## 2017-12-11 DIAGNOSIS — Z09 Encounter for follow-up examination after completed treatment for conditions other than malignant neoplasm: Secondary | ICD-10-CM

## 2017-12-11 DIAGNOSIS — L918 Other hypertrophic disorders of the skin: Secondary | ICD-10-CM

## 2017-12-11 DIAGNOSIS — E785 Hyperlipidemia, unspecified: Secondary | ICD-10-CM | POA: Diagnosis not present

## 2017-12-11 DIAGNOSIS — M810 Age-related osteoporosis without current pathological fracture: Secondary | ICD-10-CM

## 2017-12-11 DIAGNOSIS — J449 Chronic obstructive pulmonary disease, unspecified: Secondary | ICD-10-CM | POA: Diagnosis not present

## 2017-12-11 LAB — COMPREHENSIVE METABOLIC PANEL
ALT: 15 U/L (ref 0–35)
AST: 20 U/L (ref 0–37)
Albumin: 4.1 g/dL (ref 3.5–5.2)
Alkaline Phosphatase: 59 U/L (ref 39–117)
BILIRUBIN TOTAL: 0.4 mg/dL (ref 0.2–1.2)
BUN: 13 mg/dL (ref 6–23)
CHLORIDE: 106 meq/L (ref 96–112)
CO2: 28 meq/L (ref 19–32)
CREATININE: 0.57 mg/dL (ref 0.40–1.20)
Calcium: 9.4 mg/dL (ref 8.4–10.5)
GFR: 108.29 mL/min (ref >60.00)
GLUCOSE: 91 mg/dL (ref 70–99)
Potassium: 4.9 mEq/L (ref 3.5–5.1)
Sodium: 139 mEq/L (ref 135–145)
Total Protein: 6.8 g/dL (ref 6.0–8.3)

## 2017-12-11 LAB — LIPID PANEL
CHOL/HDL RATIO: 3
Cholesterol: 204 mg/dL — ABNORMAL HIGH (ref 0–200)
HDL: 64.6 mg/dL (ref >39.00)
LDL CALC: 128 mg/dL — AB (ref 0–99)
NonHDL: 139.47
Triglycerides: 57 mg/dL (ref 0.0–149.0)
VLDL: 11.4 mg/dL (ref 0.0–40.0)

## 2017-12-11 NOTE — Progress Notes (Addendum)
Subjective:    Patient ID: Mary Jordan, female    DOB: [birth year], 83 y.o.   MRN: [id number]  DOS:  12/11/2017 Type of visit - description :  f/u Interval history: High cholesterol: Good compliance with medication COPD: Good med compliance. Insomnia: On Tranxene, symptoms controlled. Has a skin lesion for years at the left arm, lately causing discomfort.  Has no change in color or bleed. osteoporosis: No treatment, reluctant to take vitamin D.  Review of Systems Denies major problems with cough or wheezing. She swims, is active, no DOE   Past Medical History:  Diagnosis Date  . Allergic rhinitis   . Anemia   . B12 deficiency   . COPD (chronic obstructive pulmonary disease) ([place])   . Hyperlipidemia   . Insomnia   . Laryngopharyngeal reflux (LPR)   . Lung mass    s/p excision of RUL benign 2007  . Menopause   . Osteoarthritis   . Osteoporosis     Past Surgical History:  Procedure Laterality Date  . ABDOMINAL HYSTERECTOMY    . BREAST BIOPSY     remotely, neg  . BREAST EXCISIONAL BIOPSY Left   . CARDIOVASCULAR STRESS TEST  2007   Cardiolite negative  . CATARACT EXTRACTION, BILATERAL    . lesion from lung excised, benign  05-2006    Social History   Socioeconomic History  . Marital status: Divorced    Spouse name: Not on file  . Number of children: 2  . Years of education: Not on file  . Highest education level: Not on file  Occupational History  . Occupation: Retired    Fish farm manager: RETIRED    CommentChiropodist  Social Needs  . Financial resource strain: Not on file  . Food insecurity:    Worry: Not on file    Inability: Not on file  . Transportation needs:    Medical: Not on file    Non-medical: Not on file  Tobacco Use  . Smoking status: Former Smoker    Packs/day: 1.00    Years: 39.00    Pack years: 39.00    Types: Cigarettes    Last attempt to quit: 08/11/1996    Years since quitting: 21.3  . Smokeless tobacco: Never Used  . Tobacco  comment:    Substance and Sexual Activity  . Alcohol use: Yes    Alcohol/week: 3.6 oz    Types: 3 Cans of beer, 3 Glasses of wine per week    Comment: three times weekly  . Drug use: No  . Sexual activity: Not on file  Lifestyle  . Physical activity:    Days per week: Not on file    Minutes per session: Not on file  . Stress: Not on file  Relationships  . Social connections:    Talks on phone: Not on file    Gets together: Not on file    Attends religious service: Not on file    Active member of club or organization: Not on file    Attends meetings of clubs or organizations: Not on file    Relationship status: Not on file  . Intimate partner violence:    Fear of current or ex partner: Not on file    Emotionally abused: Not on file    Physically abused: Not on file    Forced sexual activity: Not on file  Other Topics Concern  . Not on file  Social History Narrative   Lives by herself    Has  2 children, 4 gkids all boys           Allergies as of 12/11/2017      Reactions   Levofloxacin Hives      Medication List        Accurate as of 12/11/17 11:59 PM. Always use your most recent med list.          clorazepate 3.75 MG tablet Commonly known as:  TRANXENE TAKE 1 TABLET AT BEDTIME AS NEEDED FOR SLEEP   simvastatin 10 MG tablet Commonly known as:  ZOCOR Take 1 tablet (10 mg total) by mouth at bedtime.   SYMBICORT 160-4.5 MCG/ACT inhaler Generic drug:  budesonide-formoterol INHALE 2 PUFFS INTO THE LUNGS TWICE DAILY   VENTOLIN HFA 108 (90 Base) MCG/ACT inhaler Generic drug:  albuterol INHALE 1 TO 2 PUFFS INTO THE LUNGS EVERY6 HOURS AS NEEDED FOR WHEEZING          Objective:   Physical Exam  Skin:      BP 116/74 (BP Location: Left Arm, Patient Position: Sitting, Cuff Size: Small)   Pulse 71   Temp (!) 97.5 F (36.4 C) (Oral)   Resp 14   Ht 5\' 4"  (1.626 m)   Wt 126 lb 8 oz (57.4 kg)   SpO2 96%   BMI 21.71 kg/m  General:   Well developed, well  nourished . NAD.  HEENT:  Normocephalic . Face symmetric, atraumatic Lungs:  Slightly decreased breath sounds Normal respiratory effort, no intercostal retractions, no accessory muscle use. Heart: RRR,  no murmur.  No pretibial edema bilaterally  Skin: Not pale. Not jaundice Neurologic:  alert & oriented X3.  Speech normal, gait appropriate for age and unassisted Psych--  Cognition and judgment appear intact.  Cooperative with normal attention span and concentration.  Behavior appropriate. No anxious or depressed appearing.      Assessment & Plan:    Assessment Hyperlipidemia Insomnia: Chronic, mild COPD B12 deficiency- B12 shots elsewhere Osteoporosis : h/o wrist Fx, T score -2.2  (2013), -2.1 (03-2015) Intolerant to fosamax, actonel $$, took reclast before ~2013 DJD Menopausal Lung mass RUL, s/p excision benign 2007  PLAN Dyslipidemia, on simvastatin, checking labs Insomnia: Refill medications as needed, UDS today COPD: Doing very well on inhalers, essentially asymptomatic. Osteoporosis: Risk of fractures discussed, currently not receiving any treatment.  We talked about newer medication such as Prolia, she is reluctant to take any medication.  We agreed to continue being active and take vitamin D3 1000 units daily.  No history of vitamin D deficiency Skin lesions: Request excision. Procedure note: In a sterile fashion, with scissors, we did the elliptical incision, the whole lesion -  full thickness was removed, ~ 8 mm, sent for pathology.  She declined   local anesthesia She bled a little, silver nitrate use to stop the bleeding. Steri-Strips applied. We will call if signs of infection. If she bleeds, she will apply pressure for 10 minutes and if that is not helping she will either call me or go to a urgent care. RTC 6 months

## 2017-12-11 NOTE — Progress Notes (Signed)
Pre visit review using our clinic review tool, if applicable. No additional management support is needed unless otherwise documented below in the visit note. 

## 2017-12-11 NOTE — Patient Instructions (Addendum)
GO TO THE LAB : Get the blood work     GO TO THE FRONT DESK Schedule your next appointment for a checkup in 6 months  Take OTC vitamin D3: 1000 units daily  Keep the area clean and dry  If you have redness, swelling, discharge, fever, chills: Call the office immediately, that means you are developing infection  If you have bleeding, apply pressure for 10 or 15 minutes, call  or go to urgent care if problems

## 2017-12-12 NOTE — Assessment & Plan Note (Addendum)
Dyslipidemia, on simvastatin, checking labs Insomnia: Refill medications as needed, UDS today COPD: Doing very well on inhalers, essentially asymptomatic. Osteoporosis: Risk of fractures discussed, currently not receiving any treatment.  We talked about newer medication such as Prolia, she is reluctant to take any medication.  We agreed to continue being active and take vitamin D3 1000 units daily.  No history of vitamin D deficiency Skin lesions: Request excision. Procedure note: In a sterile fashion, with scissors, we did the elliptical incision, the whole lesion -  full thickness was removed, ~ 8 mm, sent for pathology.  She declined   local anesthesia She bled a little, silver nitrate use to stop the bleeding. Steri-Strips applied. We will call if signs of infection. If she bleeds, she will apply pressure for 10 minutes and if that is not helping she will either call me or go to a urgent care. RTC 6 months

## 2017-12-14 LAB — PAIN MGMT, PROFILE 8 W/CONF, U
6 Acetylmorphine: NEGATIVE ng/mL (ref <10)
ALPHAHYDROXYALPRAZOLAM: NEGATIVE ng/mL (ref <25)
AMINOCLONAZEPAM: NEGATIVE ng/mL (ref <25)
AMPHETAMINES: NEGATIVE ng/mL (ref <500)
Alcohol Metabolites: NEGATIVE ng/mL (ref <500)
Alphahydroxymidazolam: NEGATIVE ng/mL (ref <50)
Alphahydroxytriazolam: NEGATIVE ng/mL (ref <50)
BENZODIAZEPINES: POSITIVE ng/mL — AB (ref <100)
BUPRENORPHINE, URINE: NEGATIVE ng/mL (ref <5)
Cocaine Metabolite: NEGATIVE ng/mL (ref <150)
Creatinine: 102.9 mg/dL
HYDROXYETHYLFLURAZEPAM: NEGATIVE ng/mL (ref <50)
Lorazepam: NEGATIVE ng/mL (ref <50)
MARIJUANA METABOLITE: NEGATIVE ng/mL (ref <20)
MDMA: NEGATIVE ng/mL (ref <500)
Nordiazepam: NEGATIVE ng/mL (ref <50)
OXIDANT: NEGATIVE ug/mL (ref <200)
Opiates: NEGATIVE ng/mL (ref <100)
Oxazepam: 176 ng/mL — ABNORMAL HIGH (ref <50)
Oxycodone: NEGATIVE ng/mL (ref <100)
TEMAZEPAM: NEGATIVE ng/mL (ref <50)
pH: 5.37 (ref 4.5–9.0)

## 2017-12-22 ENCOUNTER — Telehealth: Payer: Self-pay | Admitting: Internal Medicine

## 2017-12-22 NOTE — Telephone Encounter (Signed)
Copied from [place] [id number]. Topic: Quick Communication - Lab Results >> Dec 21, 2017  1:22 PM Damita Dunnings, [place] wrote: Called patient to inform them of lab results from 12/11/2017.  When patient returns call, triage nurse may disclose results. [phone]

## 2017-12-22 NOTE — Telephone Encounter (Signed)
Contacted pt.  By phone. See result note.

## 2018-01-11 ENCOUNTER — Ambulatory Visit (INDEPENDENT_AMBULATORY_CARE_PROVIDER_SITE_OTHER): Payer: Medicare Other | Admitting: Pulmonary Disease

## 2018-01-11 ENCOUNTER — Encounter: Payer: Self-pay | Admitting: Pulmonary Disease

## 2018-01-11 VITALS — BP 132/72 | HR 96 | Ht 63.0 in | Wt 126.1 lb

## 2018-01-11 DIAGNOSIS — J449 Chronic obstructive pulmonary disease, unspecified: Secondary | ICD-10-CM | POA: Diagnosis not present

## 2018-01-11 MED ORDER — BUDESONIDE-FORMOTEROL FUMARATE 160-4.5 MCG/ACT IN AERO: 2.0000 | INHALATION_SPRAY | Freq: Two times a day (BID) | RESPIRATORY_TRACT | 0 refills | Status: DC

## 2018-01-11 NOTE — Progress Notes (Signed)
Subjective:    Patient ID: Mary Jordan, female    DOB: 10/27/1934, 83 y.o.   MRN: [id number]  Synopsis: Followed by Dr. Elsworth Soho for COPD. She used to smoke cigarettes, quit in her 28's  HPI Chief Complaint  Patient presents with  . Follow-up    no complaints from patient   [name] says she is doing OK.  She says that winter was OK, no illnesses.  She is remains active but does'nt really feel limted by her dyspnea. Not much cough.  She says that her copay for the Symbicort is high $300 for the first one of the year and she would like a sample.  She lost her emergency inhaler recently.  She uses it for her exercise classes.   Past Medical History:  Diagnosis Date  . Allergic rhinitis   . Anemia   . B12 deficiency   . COPD (chronic obstructive pulmonary disease) ([place])   . Hyperlipidemia   . Insomnia   . Laryngopharyngeal reflux (LPR)   . Lung mass    s/p excision of RUL benign 2007  . Menopause   . Osteoarthritis   . Osteoporosis       Review of Systems  Constitutional: Negative for appetite change, chills, diaphoresis, fatigue and fever.  HENT: Positive for sore throat. Negative for congestion, hearing loss, nosebleeds, postnasal drip, rhinorrhea, sinus pressure and trouble swallowing.   Respiratory: Positive for shortness of breath. Negative for cough, choking, chest tightness and wheezing.   Cardiovascular: Negative for chest pain and leg swelling.       Objective:   Physical Exam Vitals:   01/11/18 1416  BP: 132/72  Pulse: 96  SpO2: 95%  Weight: 126 lb 1.9 oz (57.2 kg)  Height: 5\' 3"  (1.6 m)    RA  Gen: well appearing HENT: OP clear, TM's clear, neck supple PULM: Poor air movement B, normal percussion CV: RRR, no mgr, trace edema GI: BS+, soft, nontender Derm: no cyanosis or rash Psyche: normal mood and affect     PFT: FeV1 57% 04/2012 11/2015 Spirometry fev1 54%  Chest imaging: August 2017 chest x-ray showed hyperinflation but no  infiltrate   CBC    Component Value Date/Time   WBC 7.0 11/13/2016 1053   RBC 4.99 11/13/2016 1053   HGB 14.9 11/13/2016 1053   HCT 44.7 11/13/2016 1053   PLT 366.0 11/13/2016 1053   MCV 89.7 11/13/2016 1053   MCH 29.2 08/04/2016 2005   MCHC 33.2 11/13/2016 1053   RDW 14.0 11/13/2016 1053   LYMPHSABS 1.4 11/13/2016 1053   MONOABS 0.6 11/13/2016 1053   EOSABS 0.2 11/13/2016 1053   BASOSABS 0.1 11/13/2016 1053        Assessment & Plan:   Chronic obstructive pulmonary disease, unspecified COPD type ([place])  Discussion: This has been a stable interval for [name].  She has not had an exacerbation and she remains compliant with exercise.  She is also compliant with her inhalers.  Plan: COPD: Continue Symbicort 2 puffs twice a day no matter how you feel Continue albuterol as needed Stay active, exercise regularly I recommend that you get the high-dose influenza vaccine when it becomes available  later this year  We will plan on seeing you back in 6 months or sooner if needed   Current Outpatient Medications:  .  clorazepate (TRANXENE) 3.75 MG tablet, TAKE 1 TABLET AT BEDTIME AS NEEDED FOR SLEEP, Disp: 30 tablet, Rfl: 3 .  simvastatin (ZOCOR) 10 MG tablet, Take  1 tablet (10 mg total) by mouth at bedtime., Disp: 30 tablet, Rfl: 5 .  SYMBICORT 160-4.5 MCG/ACT inhaler, INHALE 2 PUFFS INTO THE LUNGS TWICE DAILY, Disp: 10.2 g, Rfl: 3 .  VENTOLIN HFA 108 (90 Base) MCG/ACT inhaler, INHALE 1 TO 2 PUFFS INTO THE LUNGS EVERY6 HOURS AS NEEDED FOR WHEEZING, Disp: 18 g, Rfl: 5

## 2018-01-11 NOTE — Patient Instructions (Signed)
COPD: Continue Symbicort 2 puffs twice a day no matter how you feel Continue albuterol as needed Stay active, exercise regularly I recommend that you get the high-dose influenza vaccine when it becomes available  later this year  We will plan on seeing you back in 6 months or sooner if needed

## 2018-01-11 NOTE — Addendum Note (Signed)
Addended by: Della Goo C on: 01/11/2018 03:18 PM   Modules accepted: Orders

## 2018-01-12 ENCOUNTER — Telehealth: Payer: Self-pay | Admitting: Internal Medicine

## 2018-01-12 DIAGNOSIS — M810 Age-related osteoporosis without current pathological fracture: Secondary | ICD-10-CM

## 2018-01-12 NOTE — Telephone Encounter (Signed)
Order placed

## 2018-01-12 NOTE — Telephone Encounter (Signed)
Please advise- last bone density 2016.

## 2018-01-12 NOTE — Telephone Encounter (Signed)
Okay to schedule a bone density test, DX osteoporosis

## 2018-01-12 NOTE — Telephone Encounter (Signed)
Copied from [place] [id number]. Topic: Quick Communication - See Telephone Encounter >> Jan 12, 2018  9:11 AM Cleaster Corin, [place] wrote: CRM for notification. See Telephone encounter for: 01/12/18.  Pt. Would like to have an order to have a bone density test done.

## 2018-01-14 ENCOUNTER — Ambulatory Visit (HOSPITAL_BASED_OUTPATIENT_CLINIC_OR_DEPARTMENT_OTHER)
Admission: RE | Admit: 2018-01-14 | Discharge: 2018-01-14 | Disposition: A | Discharge status: Home / Self Care | Payer: Medicare Other | Source: Ambulatory Visit | Attending: Internal Medicine | Admitting: Internal Medicine

## 2018-01-14 DIAGNOSIS — Z780 Asymptomatic menopausal state: Secondary | ICD-10-CM | POA: Diagnosis not present

## 2018-01-14 DIAGNOSIS — M85852 Other specified disorders of bone density and structure, left thigh: Secondary | ICD-10-CM | POA: Diagnosis not present

## 2018-01-14 DIAGNOSIS — M810 Age-related osteoporosis without current pathological fracture: Secondary | ICD-10-CM | POA: Diagnosis present

## 2018-04-03 ENCOUNTER — Other Ambulatory Visit: Payer: Self-pay

## 2018-04-03 ENCOUNTER — Emergency Department (HOSPITAL_BASED_OUTPATIENT_CLINIC_OR_DEPARTMENT_OTHER)
Admission: EM | Admit: 2018-04-03 | Discharge: 2018-04-03 | Disposition: A | Discharge status: Home / Self Care | Payer: Medicare Other | Attending: Emergency Medicine | Admitting: Emergency Medicine

## 2018-04-03 ENCOUNTER — Encounter (HOSPITAL_BASED_OUTPATIENT_CLINIC_OR_DEPARTMENT_OTHER): Payer: Self-pay | Admitting: Emergency Medicine

## 2018-04-03 DIAGNOSIS — M25572 Pain in left ankle and joints of left foot: Secondary | ICD-10-CM | POA: Diagnosis not present

## 2018-04-03 DIAGNOSIS — J449 Chronic obstructive pulmonary disease, unspecified: Secondary | ICD-10-CM | POA: Insufficient documentation

## 2018-04-03 DIAGNOSIS — Z79899 Other long term (current) drug therapy: Secondary | ICD-10-CM | POA: Insufficient documentation

## 2018-04-03 DIAGNOSIS — Z87891 Personal history of nicotine dependence: Secondary | ICD-10-CM | POA: Insufficient documentation

## 2018-04-03 MED ORDER — CEPHALEXIN 500 MG PO CAPS: ORAL_CAPSULE | ORAL | 0 refills | Status: DC

## 2018-04-03 NOTE — ED Notes (Signed)
MD at bedside. 

## 2018-04-03 NOTE — Discharge Instructions (Signed)
You appear to have an inflammatory condition of your left ankle called bursitis.  It is important that you give your ankle rest.  Wear the boot every time you are up and walking.  You may take it off for bathing and resting. Try to ice your ankle up to 5 times a day.  Apply ice over a towel to the ankle for about 20 minutes at a time.  You may also apply the Voltaren gel.  Sure to call Dr. Barbaraann Barthel first thing Monday's to set up a follow-up appointment.  Also giving you a prescription for Keflex.  Hold the Keflex and do not take it unless you experience the following symptoms: 1) red streaking up the leg and expanding redness up the leg from the left ankle 2) flulike symptoms such as shaking chills, fever or feeling generally that you are getting sick.  You experience these symptoms please begin taking her antibiotic right away and get him to see a doctor within 24 hours or less.  Please read the attached information about bursitis to you have a better understanding of the process. The specific diagnosis I feel you have is called retrocalcaneal bursitis.  You may look this diagnosis up on the Internet for specific information.

## 2018-04-03 NOTE — ED Triage Notes (Signed)
Red swollen area to L ankle x 3 days.

## 2018-04-03 NOTE — ED Notes (Signed)
PT REFUSED CAM WALKER, STATES IT IS TOO LARGE TO WEAR AND SHE IS NOT GOING TO TAKE IT.

## 2018-04-03 NOTE — ED Notes (Signed)
ED Provider at bedside. 

## 2018-04-03 NOTE — ED Provider Notes (Signed)
[facility] DEPARTMENT Provider Note   CSN: [id number] Arrival date & time: 04/03/18  2993     History   Chief Complaint Chief Complaint  Patient presents with  . Ankle Pain    HPI Mary Jordan is a 83 y.o. female who presents emergency department chief complaint of left ankle pain.  Patient states that she woke 2 days ago with redness and swelling over the left lateral malleolus.  She denies any known injury, history of gout, fever or chills.  She describes the pain as aching, worse with movement and ambulation, better with rest.  She states that the course has been consistent since onset of symptoms and has not worsened or improved.  She is able to ambulate.  She has no known injuries or previous episodes that are similar.  The patient is very active and does daily exercise including water aerobics and weightlifting.  She states that she thought she might have a spider bite or an infection and her friends encouraged her to come to the ER.  She has tried applying topical Voltaren, she tried part peroxide over her leg and also applying Campho-Phenique in case it was a bite.    HPI  Past Medical History:  Diagnosis Date  . Allergic rhinitis   . Anemia   . B12 deficiency   . COPD (chronic obstructive pulmonary disease) ([place])   . Hyperlipidemia   . Insomnia   . Laryngopharyngeal reflux (LPR)   . Lung mass    s/p excision of RUL benign 2007  . Menopause   . Osteoarthritis   . Osteoporosis     Patient Active Problem List   Diagnosis Date Noted  . Laryngopharyngeal reflux (LPR) 11/26/2016  . Lightheaded 03/20/2016  . PCP NOTES >>>> 06/30/2015  . COPD (chronic obstructive pulmonary disease) ([place]) 05/31/2015  . Elevated BP 11/16/2013  . Annual physical exam 03/26/2011  . CERVICAL RADICULOPATHY, LEFT 10/14/2010  . COPD exacerbation ([place]) 09/05/2010  . Allergic rhinitis 05/29/2010  . ANEMIA, B12 DEFICIENCY 11/05/2007  . Dyslipidemia 10/06/2007  . DJD  (degenerative joint disease) 10/06/2007  . Osteoporosis 06/07/2007  . INSOMNIA, CHRONIC, MILD 04/05/2007    Past Surgical History:  Procedure Laterality Date  . ABDOMINAL HYSTERECTOMY    . BREAST BIOPSY     remotely, neg  . BREAST EXCISIONAL BIOPSY Left   . CARDIOVASCULAR STRESS TEST  2007   Cardiolite negative  . CATARACT EXTRACTION, BILATERAL    . lesion from lung excised, benign  05-2006     OB History   None      Home Medications    Prior to Admission medications   Medication Sig Start Date End Date Taking? Authorizing Provider  budesonide-formoterol (SYMBICORT) 160-4.5 MCG/ACT inhaler Inhale 2 puffs into the lungs 2 (two) times daily. 01/11/18   Juanito Doom, MD  clorazepate (TRANXENE) 3.75 MG tablet TAKE 1 TABLET AT BEDTIME AS NEEDED FOR SLEEP 10/15/17   Colon Branch, MD  simvastatin (ZOCOR) 10 MG tablet Take 1 tablet (10 mg total) by mouth at bedtime. 08/03/17   Colon Branch, MD  SYMBICORT 160-4.5 MCG/ACT inhaler INHALE 2 PUFFS INTO THE LUNGS TWICE DAILY 10/15/17   Rigoberto Noel, MD  VENTOLIN HFA 108 (90 Base) MCG/ACT inhaler INHALE 1 TO 2 PUFFS INTO THE LUNGS EVERY6 HOURS AS NEEDED FOR WHEEZING 07/07/17   Parrett, Fonnie Mu, NP    Family History Family History  Problem Relation Age of Onset  . Heart attack Father  12  . Dementia Mother   . Breast cancer Mother   . Asthma Mother   . Hyperlipidemia Neg Hx   . Diabetes Neg Hx   . Stroke Neg Hx   . Colon cancer Neg Hx     Social History Social History   Tobacco Use  . Smoking status: Former Smoker    Packs/day: 1.00    Years: 39.00    Pack years: 39.00    Types: Cigarettes    Last attempt to quit: 08/11/1996    Years since quitting: 21.6  . Smokeless tobacco: Never Used  . Tobacco comment:    Substance Use Topics  . Alcohol use: Yes    Alcohol/week: 6.0 standard drinks    Types: 3 Cans of beer, 3 Glasses of wine per week    Comment: three times weekly  . Drug use: No     Allergies    Levofloxacin   Review of Systems Review of Systems  Ten systems reviewed and are negative for acute change, except as noted in the HPI.   Physical Exam Updated Vital Signs BP (!) 159/90 (BP Location: Left Arm)   Pulse 92   Temp (!) 97.4 F (36.3 C) (Oral)   Resp 18   Ht 5' 3.5" (1.613 m)   Wt 57.2 kg   SpO2 97%   BMI 21.97 kg/m   Physical Exam  Constitutional: She is oriented to person, place, and time. She appears well-developed and well-nourished. No distress.  HENT:  Head: Normocephalic and atraumatic.  Eyes: Conjunctivae are normal. No scleral icterus.  Neck: Normal range of motion.  Cardiovascular: Normal rate, regular rhythm and normal heart sounds. Exam reveals no gallop and no friction rub.  No murmur heard. Pulmonary/Chest: Effort normal and breath sounds normal. No respiratory distress.  Abdominal: Soft. Bowel sounds are normal. She exhibits no distension and no mass. There is no tenderness. There is no guarding.  Musculoskeletal:  The left ankle reveals erythema and tenderness over and just posterior to the left lateral malleolus.  The patient has near full active range of motion however there is pain over the left lateral malleolus.  It is better with passive range of motion.  She has normal DP and PT pulses.  She is point tender over the lateral malleolus.  She has normal sensation in the foot.  There is no abrasion or skin break.  No streaking, mild warmth to palpation.  Neurological: She is alert and oriented to person, place, and time.  Skin: Skin is warm and dry. She is not diaphoretic.  Psychiatric: Her behavior is normal.  Nursing note and vitals reviewed.    ED Treatments / Results  Labs (all labs ordered are listed, but only abnormal results are displayed) Labs Reviewed - No data to display  EKG None  Radiology No results found.  Procedures Procedures (including critical care time)  Medications Ordered in ED Medications - No data to  display   Initial Impression / Assessment and Plan / ED Course  I have reviewed the triage vital signs and the nursing notes.  Pertinent labs & imaging results that were available during my care of the patient were reviewed by me and considered in my medical decision making (see chart for details).        This is an 83 year old female with no history of gout or immunocompromise who presents the emergency department with chief complaint of left ankle pain.I have gathered information from the patient  as well as  EMR.  She had no known injury and is ambulatory I do not think that imaging would benefit her work-up today.  The patient has pain with palpation and active range of motion however is pain-free with passive range of motion of the ankle which is most suggestive of retrocalcaneal bursitis on examination. I did not aspirate the bursa and therefore cannot make a definitive diagnosis however the patient is asked to follow very closely with her sports medicine physician Dr. Barbaraann Barthel.  We will plan to discharge the patient with a cam walker and I have given her very specific directions of use.  She is also advised to continue with her Voltaren gel and ice up to 5 times daily.  Patient will be given a prescription for Keflex to hold should she see signs or symptoms of infection or cellulitis.  This is currently low on my differential.  The patient was seen and shared visit with Dr. Regenia Skeeter who agrees with work-up and plan of care.  Patient appears appropriate for discharge at this time with strong return precautions. Final Clinical Impressions(s) / ED Diagnoses   Final diagnoses:  None    ED Discharge Orders    None       Margarita Mail, PA-C 04/03/18 [place], MD 04/04/18 [phone]

## 2018-04-06 ENCOUNTER — Encounter: Payer: Self-pay | Admitting: Family Medicine

## 2018-04-06 ENCOUNTER — Ambulatory Visit (INDEPENDENT_AMBULATORY_CARE_PROVIDER_SITE_OTHER): Payer: Medicare Other | Admitting: Family Medicine

## 2018-04-06 VITALS — BP 155/87 | HR 87 | Ht 64.0 in | Wt 126.0 lb

## 2018-04-06 DIAGNOSIS — M25572 Pain in left ankle and joints of left foot: Secondary | ICD-10-CM

## 2018-04-06 MED ORDER — DICLOFENAC SODIUM 1 % TD GEL: 4.0000 g | Freq: Four times a day (QID) | TRANSDERMAL | 1 refills | Status: DC

## 2018-04-06 NOTE — Patient Instructions (Signed)
Continue icing this for a couple more days. Use voltaren gel for this if needed but also for your knees as needed. Follow up with me as needed.

## 2018-04-07 ENCOUNTER — Encounter: Payer: Self-pay | Admitting: Family Medicine

## 2018-04-07 NOTE — Progress Notes (Signed)
PCP: Colon Branch, MD  Subjective:   HPI: Patient is a 83 y.o. female here for left ankle pain.  Patient reports on August 22 she woke up with pretty severe swelling and redness over the lateral aspect of left ankle lateral malleolus. No increase in activity level prior to this and no known injury. Pain level is down to 0 out of 10 now as she has been icing it.  She was also using Voltaren gel. No obvious bug bites.  No other skin changes.  Past Medical History:  Diagnosis Date  . Allergic rhinitis   . Anemia   . B12 deficiency   . COPD (chronic obstructive pulmonary disease) ([place])   . Hyperlipidemia   . Insomnia   . Laryngopharyngeal reflux (LPR)   . Lung mass    s/p excision of RUL benign 2007  . Menopause   . Osteoarthritis   . Osteoporosis     Current Outpatient Medications on File Prior to Visit  Medication Sig Dispense Refill  . budesonide-formoterol (SYMBICORT) 160-4.5 MCG/ACT inhaler Inhale 2 puffs into the lungs 2 (two) times daily. 1 Inhaler 0  . cephALEXin (KEFLEX) 500 MG capsule 2 caps po bid x 7 days 28 capsule 0  . clorazepate (TRANXENE) 3.75 MG tablet TAKE 1 TABLET AT BEDTIME AS NEEDED FOR SLEEP 30 tablet 3  . simvastatin (ZOCOR) 10 MG tablet Take 1 tablet (10 mg total) by mouth at bedtime. 30 tablet 5  . SYMBICORT 160-4.5 MCG/ACT inhaler INHALE 2 PUFFS INTO THE LUNGS TWICE DAILY 10.2 g 3  . VENTOLIN HFA 108 (90 Base) MCG/ACT inhaler INHALE 1 TO 2 PUFFS INTO THE LUNGS EVERY6 HOURS AS NEEDED FOR WHEEZING 18 g 5   No current facility-administered medications on file prior to visit.     Past Surgical History:  Procedure Laterality Date  . ABDOMINAL HYSTERECTOMY    . BREAST BIOPSY     remotely, neg  . BREAST EXCISIONAL BIOPSY Left   . CARDIOVASCULAR STRESS TEST  2007   Cardiolite negative  . CATARACT EXTRACTION, BILATERAL    . lesion from lung excised, benign  05-2006    Allergies  Allergen Reactions  . Levofloxacin Hives    Social History    Socioeconomic History  . Marital status: Divorced    Spouse name: Not on file  . Number of children: 2  . Years of education: Not on file  . Highest education level: Not on file  Occupational History  . Occupation: Retired    Fish farm manager: RETIRED    CommentChiropodist  Social Needs  . Financial resource strain: Not on file  . Food insecurity:    Worry: Not on file    Inability: Not on file  . Transportation needs:    Medical: Not on file    Non-medical: Not on file  Tobacco Use  . Smoking status: Former Smoker    Packs/day: 1.00    Years: 39.00    Pack years: 39.00    Types: Cigarettes    Last attempt to quit: 08/11/1996    Years since quitting: 21.6  . Smokeless tobacco: Never Used  . Tobacco comment:    Substance and Sexual Activity  . Alcohol use: Yes    Alcohol/week: 6.0 standard drinks    Types: 3 Cans of beer, 3 Glasses of wine per week    Comment: three times weekly  . Drug use: No  . Sexual activity: Not on file  Lifestyle  . Physical activity:  Days per week: Not on file    Minutes per session: Not on file  . Stress: Not on file  Relationships  . Social connections:    Talks on phone: Not on file    Gets together: Not on file    Attends religious service: Not on file    Active member of club or organization: Not on file    Attends meetings of clubs or organizations: Not on file    Relationship status: Not on file  . Intimate partner violence:    Fear of current or ex partner: Not on file    Emotionally abused: Not on file    Physically abused: Not on file    Forced sexual activity: Not on file  Other Topics Concern  . Not on file  Social History Narrative   Lives by herself    Has 2 children, 4 gkids all boys         Family History  Problem Relation Age of Onset  . Heart attack Father 79  . Dementia Mother   . Breast cancer Mother   . Asthma Mother   . Hyperlipidemia Neg Hx   . Diabetes Neg Hx   . Stroke Neg Hx   . Colon cancer Neg Hx      BP (!) 155/87   Pulse 87   Ht 5\' 4"  (1.626 m)   Wt 126 lb (57.2 kg)   BMI 21.63 kg/m   Review of Systems: See HPI above.     Objective:  Physical Exam:  Gen: NAD, comfortable in exam room  Left ankle: No gross deformity, swelling, ecchymoses FROM with 5/5 strength. TTP minimally lateral malleolus. Negative ant drawer and talar tilt.   Negative syndesmotic compression. Thompsons test negative. NV intact distally.   Assessment & Plan:  1. Left ankle pain - significant improvement over past few days with icing.  Did not come on with injury - would suggest possible insect bite but no puncture wounds visible.  Encouraged voltaren gel if needed.  F/u prn.

## 2018-04-13 ENCOUNTER — Other Ambulatory Visit: Payer: Self-pay | Admitting: Pulmonary Disease

## 2018-05-04 ENCOUNTER — Telehealth: Payer: Self-pay | Admitting: Internal Medicine

## 2018-05-04 NOTE — Telephone Encounter (Signed)
Pt is requesting refill on clorazepate.   Last OV: 12/11/2017 Last Fill: 10/15/2017 #30 and 3RF UDS: 12/11/2017 Low risk  NCCR printed- no discrepacnies noted- sent for scanning

## 2018-05-04 NOTE — Telephone Encounter (Signed)
Sent!

## 2018-05-05 DIAGNOSIS — Z23 Encounter for immunization: Secondary | ICD-10-CM | POA: Diagnosis not present

## 2018-05-18 DIAGNOSIS — L821 Other seborrheic keratosis: Secondary | ICD-10-CM | POA: Diagnosis not present

## 2018-05-18 DIAGNOSIS — D225 Melanocytic nevi of trunk: Secondary | ICD-10-CM | POA: Diagnosis not present

## 2018-05-18 DIAGNOSIS — L820 Inflamed seborrheic keratosis: Secondary | ICD-10-CM | POA: Diagnosis not present

## 2018-05-18 DIAGNOSIS — L720 Epidermal cyst: Secondary | ICD-10-CM | POA: Diagnosis not present

## 2018-05-18 DIAGNOSIS — D485 Neoplasm of uncertain behavior of skin: Secondary | ICD-10-CM | POA: Diagnosis not present

## 2018-06-14 ENCOUNTER — Ambulatory Visit: Payer: Medicare Other | Admitting: Internal Medicine

## 2018-06-18 ENCOUNTER — Encounter: Payer: Self-pay | Admitting: Internal Medicine

## 2018-06-18 ENCOUNTER — Telehealth: Payer: Self-pay

## 2018-06-18 ENCOUNTER — Ambulatory Visit (INDEPENDENT_AMBULATORY_CARE_PROVIDER_SITE_OTHER): Payer: Medicare Other | Admitting: Internal Medicine

## 2018-06-18 ENCOUNTER — Other Ambulatory Visit: Payer: Self-pay

## 2018-06-18 VITALS — BP 118/68 | HR 79 | Temp 97.9°F | Resp 16 | Ht 64.0 in | Wt 130.0 lb

## 2018-06-18 DIAGNOSIS — M810 Age-related osteoporosis without current pathological fracture: Secondary | ICD-10-CM | POA: Diagnosis not present

## 2018-06-18 DIAGNOSIS — J449 Chronic obstructive pulmonary disease, unspecified: Secondary | ICD-10-CM

## 2018-06-18 DIAGNOSIS — G4700 Insomnia, unspecified: Secondary | ICD-10-CM | POA: Diagnosis not present

## 2018-06-18 DIAGNOSIS — E785 Hyperlipidemia, unspecified: Secondary | ICD-10-CM | POA: Diagnosis not present

## 2018-06-18 DIAGNOSIS — M170 Bilateral primary osteoarthritis of knee: Secondary | ICD-10-CM

## 2018-06-18 LAB — CBC WITH DIFFERENTIAL/PLATELET
BASOS ABS: 0.1 10*3/uL (ref 0.0–0.1)
Basophils Relative: 1 % (ref 0.0–3.0)
EOS ABS: 0.1 10*3/uL (ref 0.0–0.7)
Eosinophils Relative: 1.3 % (ref 0.0–5.0)
HEMATOCRIT: 42.3 % (ref 36.0–46.0)
HEMOGLOBIN: 14 g/dL (ref 12.0–15.0)
LYMPHS PCT: 17 % (ref 12.0–46.0)
Lymphs Abs: 1.1 10*3/uL (ref 0.7–4.0)
MCHC: 33.2 g/dL (ref 30.0–36.0)
MCV: 90.1 fl (ref 78.0–100.0)
Monocytes Absolute: 0.4 10*3/uL (ref 0.1–1.0)
Monocytes Relative: 6.3 % (ref 3.0–12.0)
Neutro Abs: 4.8 10*3/uL (ref 1.4–7.7)
Neutrophils Relative %: 74.4 % (ref 43.0–77.0)
Platelets: 356 10*3/uL (ref 150.0–400.0)
RBC: 4.69 Mil/uL (ref 3.87–5.11)
RDW: 13.6 % (ref 11.5–15.5)
WBC: 6.5 10*3/uL (ref 4.0–10.5)

## 2018-06-18 LAB — BASIC METABOLIC PANEL
BUN: 13 mg/dL (ref 6–23)
CALCIUM: 9.3 mg/dL (ref 8.4–10.5)
CO2: 29 mEq/L (ref 19–32)
CREATININE: 0.59 mg/dL (ref 0.40–1.20)
Chloride: 103 mEq/L (ref 96–112)
GFR: 103.93 mL/min (ref >60.00)
Glucose, Bld: 95 mg/dL (ref 70–99)
Potassium: 4 mEq/L (ref 3.5–5.1)
Sodium: 138 mEq/L (ref 135–145)

## 2018-06-18 MED ORDER — DICLOFENAC SODIUM 1 % TD GEL: 4.0000 g | Freq: Four times a day (QID) | TRANSDERMAL | 1 refills | Status: DC

## 2018-06-18 NOTE — Assessment & Plan Note (Signed)
--  Td 2017; pnm 23:  2008, 2013 ; prevnar 2015; zostavax: 2009; s/p shingrex; had a flu shot  --CCS: Cscope 03-2008 Dr Olevia Perches 2 polyps (Hyperplastic and tubular adenoma) , colonoscopy again 11-15- 2013, 1 polyp.  Does not desire more cscopes Breast Ca screening: mother had breast ca in her 50, pt never had a abnormal mmg-bx; MMG 05-2017 (-)  -- H/o hysterectomy, asx , no further screenings .

## 2018-06-18 NOTE — Progress Notes (Signed)
Pre visit review using our clinic review tool, if applicable. No additional management support is needed unless otherwise documented below in the visit note. 

## 2018-06-18 NOTE — Progress Notes (Signed)
Subjective:    Patient ID: Mary Jordan, female    DOB: 1935/03/27, 83 y.o.   MRN: [id number]  DOS:  06/18/2018 Type of visit - description : rov Interval history: COPD: Pulmonary note reviewed. Insomnia: Good compliance with Tranxene, denies feeling excessively sleepy the next day. DJD: Takes occasional Advil, has not used Tylenol recently.  Having a difficult getting Voltaren gel. Osteoporosis: Currently on no medications except vitamin D.  No recent falls Review of Systems  Denies chest pain, shortness of breath at baseline Denies nausea, vomiting, diarrhea.  No blood in the stools  Past Medical History:  Diagnosis Date  . Allergic rhinitis   . Anemia   . B12 deficiency   . COPD (chronic obstructive pulmonary disease) ([place])   . Hyperlipidemia   . Insomnia   . Laryngopharyngeal reflux (LPR)   . Lung mass    s/p excision of RUL benign 2007  . Menopause   . Osteoarthritis   . Osteoporosis     Past Surgical History:  Procedure Laterality Date  . ABDOMINAL HYSTERECTOMY    . BREAST BIOPSY     remotely, neg  . BREAST EXCISIONAL BIOPSY Left   . CARDIOVASCULAR STRESS TEST  2007   Cardiolite negative  . CATARACT EXTRACTION, BILATERAL    . lesion from lung excised, benign  05-2006    Social History   Socioeconomic History  . Marital status: Divorced    Spouse name: Not on file  . Number of children: 2  . Years of education: Not on file  . Highest education level: Not on file  Occupational History  . Occupation: Retired    Fish farm manager: RETIRED    CommentChiropodist  Social Needs  . Financial resource strain: Not on file  . Food insecurity:    Worry: Not on file    Inability: Not on file  . Transportation needs:    Medical: Not on file    Non-medical: Not on file  Tobacco Use  . Smoking status: Former Smoker    Packs/day: 1.00    Years: 39.00    Pack years: 39.00    Types: Cigarettes    Last attempt to quit: 08/11/1996    Years since quitting: 21.8  .  Smokeless tobacco: Never Used  . Tobacco comment:    Substance and Sexual Activity  . Alcohol use: Yes    Alcohol/week: 6.0 standard drinks    Types: 3 Cans of beer, 3 Glasses of wine per week    Comment: three times weekly  . Drug use: No  . Sexual activity: Not on file  Lifestyle  . Physical activity:    Days per week: Not on file    Minutes per session: Not on file  . Stress: Not on file  Relationships  . Social connections:    Talks on phone: Not on file    Gets together: Not on file    Attends religious service: Not on file    Active member of club or organization: Not on file    Attends meetings of clubs or organizations: Not on file    Relationship status: Not on file  . Intimate partner violence:    Fear of current or ex partner: Not on file    Emotionally abused: Not on file    Physically abused: Not on file    Forced sexual activity: Not on file  Other Topics Concern  . Not on file  Social History Narrative   Lives by herself  Has 2 children, 4 gkids all boys           Allergies as of 06/18/2018      Reactions   Levofloxacin Hives      Medication List        Accurate as of 06/18/18 11:59 PM. Always use your most recent med list.          budesonide-formoterol 160-4.5 MCG/ACT inhaler Commonly known as:  SYMBICORT Inhale 2 puffs into the lungs 2 (two) times daily.   clorazepate 3.75 MG tablet Commonly known as:  TRANXENE TAKE ONE TABLET BY MOUTH AT BEDTIME AS NEEDED FOR SLEEP   diclofenac sodium 1 % Gel Commonly known as:  VOLTAREN Apply 4 g topically 4 (four) times daily.   simvastatin 10 MG tablet Commonly known as:  ZOCOR Take 1 tablet (10 mg total) by mouth at bedtime.   VENTOLIN HFA 108 (90 Base) MCG/ACT inhaler Generic drug:  albuterol INHALE 1 TO 2 PUFFS INTO THE LUNGS EVERY6 HOURS AS NEEDED FOR WHEEZING          Objective:   Physical Exam BP 118/68 (BP Location: Left Arm, Patient Position: Sitting, Cuff Size: Small)   Pulse  79   Temp 97.9 F (36.6 C) (Oral)   Resp 16   Ht 5\' 4"  (1.626 m)   Wt 130 lb (59 kg)   SpO2 95%   BMI 22.31 kg/m  General:   Well developed, well nourished . NAD.  HEENT:  Normocephalic . Face symmetric, atraumatic Lungs:  Decreased breath sounds otherwise clear Heart: RRR,  no murmur.  No pretibial edema bilaterally  Skin: Not pale. Not jaundice Neurologic:  alert & oriented X3.  Speech normal, gait appropriate for age and unassisted Psych--  Cognition and judgment appear intact.  Cooperative with normal attention span and concentration.  Behavior appropriate. No anxious or depressed appearing.     Assessment & Plan:   Assessment Hyperlipidemia Insomnia: Chronic, mild COPD B12 deficiency- B12 shots elsewhere Osteoporosis : h/o wrist Fx, T score -2.2  (2013), -2.1 (03-2015) Intolerant to fosamax, actonel $$, took reclast before ~2013 DJD Menopausal Lung mass RUL, s/p excision benign 2007  PLAN Preventive care reviewed  Hyperlipidemia: Well-controlled, continue simvastatin.  Check a CBC Insomnia: Control it with Tranxene. COPD: Saw pulmonary recently, note reviewed stable. DJD: Having a hard time getting Voltaren gel, new prescription printed. She takes occasionally Advil, I favor Tylenol.  Risk of NSAIDs discussed. Osteoporosis: History of wrist fracture, T score on 01/2018 -2.3.  Risk of  more fractures discussed. She is already taking vitamin D,  we talk about options including Reclast and Prolia.  She took Reclast before without problems, she agreed to try again.  Will set up.  Get a BMP RTC 6 months  Today, I spent more than  25  min with the patient: >50% of the time counseling regards osteoporosis, risk of falls and fractures, treatment options. We also discussed the reason why recommend Tylenol in favor of NSAIDs. Also: -reviewing the chart and noted from other providers  -coordinating his care

## 2018-06-18 NOTE — Patient Instructions (Signed)
GO TO THE LAB : Get the blood work     GO TO THE FRONT DESK Schedule your next appointment for a checkup in 6 months   For pain in your hand, arthritis: Try to use the Voltaren gel, recommend to go to the pharmacy of the first floor and see if you can get it Tylenol  500 mg OTC 2 tabs a day every 8 hours as needed for pain

## 2018-06-18 NOTE — Telephone Encounter (Signed)
Call [facility] to schedule Reclast.

## 2018-06-20 NOTE — Assessment & Plan Note (Signed)
Preventive care reviewed  Hyperlipidemia: Well-controlled, continue simvastatin.  Check a CBC Insomnia: Control it with Tranxene. COPD: Saw pulmonary recently, note reviewed stable. DJD: Having a hard time getting Voltaren gel, new prescription printed. She takes occasionally Advil, I favor Tylenol.  Risk of NSAIDs discussed. Osteoporosis: History of wrist fracture, T score on 01/2018 -2.3.  Risk of  more fractures discussed. She is already taking vitamin D,  we talk about options including Reclast and Prolia.  She took Reclast before without problems, she agreed to try again.  Will set up.  Get a BMP RTC 6 months

## 2018-06-21 NOTE — Telephone Encounter (Signed)
Reclast scheduled for 06/30/2018 at 12:30pm at [facility]. Pt informed.

## 2018-06-30 ENCOUNTER — Encounter (HOSPITAL_COMMUNITY): Payer: Self-pay

## 2018-06-30 ENCOUNTER — Other Ambulatory Visit: Payer: Self-pay

## 2018-06-30 ENCOUNTER — Ambulatory Visit (HOSPITAL_COMMUNITY)
Admission: RE | Admit: 2018-06-30 | Discharge: 2018-06-30 | Disposition: A | Discharge status: Home / Self Care | Payer: Medicare Other | Source: Ambulatory Visit | Attending: Internal Medicine | Admitting: Internal Medicine

## 2018-06-30 DIAGNOSIS — M810 Age-related osteoporosis without current pathological fracture: Secondary | ICD-10-CM

## 2018-06-30 MED ORDER — SODIUM CHLORIDE 0.9 % IV SOLN
Freq: Once | INTRAVENOUS | Status: AC
  Administered 2018-06-30: 12:00:00 via INTRAVENOUS

## 2018-06-30 MED ORDER — ZOLEDRONIC ACID 5 MG/100ML IV SOLN
5.0000 mg | Freq: Once | INTRAVENOUS | Status: AC
  Administered 2018-06-30: 5 mg via INTRAVENOUS
  Filled 2018-06-30: qty 100

## 2018-06-30 NOTE — Discharge Instructions (Signed)

## 2018-07-13 ENCOUNTER — Encounter: Payer: Self-pay | Admitting: Pulmonary Disease

## 2018-07-13 ENCOUNTER — Ambulatory Visit: Payer: Medicare Other | Admitting: Pulmonary Disease

## 2018-07-13 ENCOUNTER — Ambulatory Visit (INDEPENDENT_AMBULATORY_CARE_PROVIDER_SITE_OTHER): Payer: Medicare Other | Admitting: Pulmonary Disease

## 2018-07-13 VITALS — BP 124/80 | HR 95 | Ht 63.0 in | Wt 124.0 lb

## 2018-07-13 DIAGNOSIS — J449 Chronic obstructive pulmonary disease, unspecified: Secondary | ICD-10-CM | POA: Diagnosis not present

## 2018-07-13 MED ORDER — IPRATROPIUM-ALBUTEROL 20-100 MCG/ACT IN AERS: 1.0000 | INHALATION_SPRAY | Freq: Four times a day (QID) | RESPIRATORY_TRACT | 2 refills | Status: DC | PRN

## 2018-07-13 NOTE — Patient Instructions (Signed)
COPD with shortness of breath which is increasing: Stop Ventolin Start using Combivent 1 puff every 6 hours as needed for shortness of breath Continue Symbicort 2 puffs twice a day Practice good hand hygiene I am glad your flu shot is up-to-date Make it a goal to be able to walk 20 minutes a day every single day.  You should also do resistance training (something similar to weight training) at least twice a week.  We will see you back in 6 months or sooner if needed

## 2018-07-13 NOTE — Progress Notes (Signed)
Subjective:    Patient ID: Mary Jordan, female    DOB: [birth year], 83 y.o.   MRN: [id number]  Synopsis: Followed by Dr. Elsworth Soho for COPD. She used to smoke cigarettes, quit in her 43's  HPI Chief Complaint  Patient presents with  . Follow-up    Pt is doing well overall.    Mary Jordan has not been sick lately but is frustrated with some dyspnea. She says that some days are better than others, and on some days she feels like the Ventolin doesn't help.  She says some days she says it will help her shortness of breath.  She says she can walk indefinitely and doesn't have to pace.    Past Medical History:  Diagnosis Date  . Allergic rhinitis   . Anemia   . B12 deficiency   . COPD (chronic obstructive pulmonary disease) ([place])   . Hyperlipidemia   . Insomnia   . Laryngopharyngeal reflux (LPR)   . Lung mass    s/p excision of RUL benign 2007  . Menopause   . Osteoarthritis   . Osteoporosis       Review of Systems  Constitutional: Negative for appetite change, chills, diaphoresis, fatigue and fever.  HENT: Positive for sore throat. Negative for congestion, hearing loss, nosebleeds, postnasal drip, rhinorrhea, sinus pressure and trouble swallowing.   Respiratory: Positive for shortness of breath. Negative for cough, choking, chest tightness and wheezing.   Cardiovascular: Negative for chest pain and leg swelling.       Objective:   Physical Exam Vitals:   07/13/18 1402  BP: 124/80  Pulse: 95  SpO2: 96%  Weight: 124 lb (56.2 kg)  Height: 5\' 3"  (1.6 m)    RA  Gen: well appearing HENT: OP clear, TM's clear, neck supple PULM: Poor air movement B, normal percussion CV: RRR, no mgr, trace edema GI: BS+, soft, nontender Derm: no cyanosis or rash Psyche: normal mood and affect   PFT: FeV1 57% 04/2012 11/2015 Spirometry fev1 54%  Chest imaging: August 2017 chest x-ray showed hyperinflation but no infiltrate   CBC    Component Value Date/Time   WBC 6.5 06/18/2018  1053   RBC 4.69 06/18/2018 1053   HGB 14.0 06/18/2018 1053   HCT 42.3 06/18/2018 1053   PLT 356.0 06/18/2018 1053   MCV 90.1 06/18/2018 1053   MCH 29.2 08/04/2016 2005   MCHC 33.2 06/18/2018 1053   RDW 13.6 06/18/2018 1053   LYMPHSABS 1.1 06/18/2018 1053   MONOABS 0.4 06/18/2018 1053   EOSABS 0.1 06/18/2018 1053   BASOSABS 0.1 06/18/2018 1053        Assessment & Plan:   Chronic obstructive pulmonary disease, unspecified COPD type ([place])  Discussion: This has been a stable interval for Ms. Sharlett Iles.  She has not had an exacerbation since the last visit.  She does experience a little bit more shortness of breath.  She is wondering if there is something stronger than albuterol.  We talked about options of adding Spiriva daily versus changing albuterol to Combivent.  She favors changing to Combivent because she says that some days she feels fine and does not need albuterol.  Plan: COPD with shortness of breath which is increasing: Stop Ventolin Start using Combivent 1 puff every 6 hours as needed for shortness of breath Continue Symbicort 2 puffs twice a day Practice good hand hygiene I am glad your flu shot is up-to-date Make it a goal to be able to walk 20 minutes a day  every single day.  You should also do resistance training (something similar to weight training) at least twice a week.  We will see you back in 6 months or sooner if needed   Current Outpatient Medications:  .  budesonide-formoterol (SYMBICORT) 160-4.5 MCG/ACT inhaler, Inhale 2 puffs into the lungs 2 (two) times daily., Disp: 1 Inhaler, Rfl: 0 .  clorazepate (TRANXENE) 3.75 MG tablet, TAKE ONE TABLET BY MOUTH AT BEDTIME AS NEEDED FOR SLEEP, Disp: 30 tablet, Rfl: 3 .  diclofenac sodium (VOLTAREN) 1 % GEL, Apply 4 g topically 4 (four) times daily., Disp: 5 Tube, Rfl: 1 .  simvastatin (ZOCOR) 10 MG tablet, Take 1 tablet (10 mg total) by mouth at bedtime., Disp: 30 tablet, Rfl: 5 .  VENTOLIN HFA 108 (90 Base)  MCG/ACT inhaler, INHALE 1 TO 2 PUFFS INTO THE LUNGS EVERY6 HOURS AS NEEDED FOR WHEEZING, Disp: 18 g, Rfl: 5 .  Ipratropium-Albuterol (COMBIVENT RESPIMAT) 20-100 MCG/ACT AERS respimat, Inhale 1 puff into the lungs every 6 (six) hours as needed for wheezing or shortness of breath (As needed)., Disp: 1 Inhaler, Rfl: 2

## 2018-07-21 ENCOUNTER — Telehealth: Payer: Self-pay | Admitting: Pulmonary Disease

## 2018-07-21 NOTE — Telephone Encounter (Signed)
PA request received from [facility]: Combivent Respimat CMM Key: [id number]  PA request has been sent to plan, and a determination is expected within 5 business days.   Routing to [employer] for follow-up.

## 2018-07-22 NOTE — Telephone Encounter (Signed)
Called and spoke with patient, she stated she is fine with doing both. Nothing further needed. Will close encounter.

## 2018-07-22 NOTE — Telephone Encounter (Signed)
[employer] [phone]; ;pt denied Combivent; pt should try at least 2 formulary drugs before coverage for a nonformulary drug; otherwise; BQ will have to do an appeal. Formulary drugs are: Atrovent, Anoro, Incruse, or Spiriva.

## 2018-07-22 NOTE — Telephone Encounter (Signed)
Dr Lake Bells  The combivent was denied  She must try and fail 2 of the following first:Atrovent, Anoro, Incruse, or Spiriva Please advise, thanks

## 2018-07-22 NOTE — Telephone Encounter (Signed)
Ask her if she wants to take both albuterol and atrovent at the same time Otherwise we can appeal

## 2018-07-26 ENCOUNTER — Other Ambulatory Visit: Payer: Self-pay | Admitting: Adult Health

## 2018-08-26 ENCOUNTER — Telehealth: Payer: Self-pay | Admitting: Internal Medicine

## 2018-08-26 NOTE — Telephone Encounter (Signed)
sent 

## 2018-08-26 NOTE — Telephone Encounter (Signed)
Pt is requesting refill on clorazepate.   Last OV: 06/18/2018 Last Fill: 05/04/2018 #30 and 3RF UDS: 12/11/2017 Low risk  NCCR printed- no discrepancies noted- sent for scanning

## 2018-09-06 ENCOUNTER — Telehealth: Payer: Self-pay | Admitting: Pulmonary Disease

## 2018-09-06 MED ORDER — BUDESONIDE-FORMOTEROL FUMARATE 160-4.5 MCG/ACT IN AERO: 2.0000 | INHALATION_SPRAY | Freq: Two times a day (BID) | RESPIRATORY_TRACT | 0 refills | Status: DC

## 2018-09-06 NOTE — Telephone Encounter (Signed)
Spoke with pt. She is requesting samples of Symbicort. We only had 1 sample in the office at this time. She would like for this to taken to our [facility] office on Thursday. I have spoken with [facility] and she is going to take this with her on Thursday. Nothing further was needed.

## 2018-09-08 ENCOUNTER — Telehealth: Payer: Self-pay | Admitting: Pulmonary Disease

## 2018-09-08 ENCOUNTER — Telehealth: Payer: Self-pay

## 2018-09-08 NOTE — Telephone Encounter (Signed)
Received PA form from [facility] for Symbicort. Medication last filled by Dr. Lake Bells- form faxed to pulm at [phone].

## 2018-09-08 NOTE — Telephone Encounter (Signed)
Call made to [facility], spoke with Beau Fanny, pharmacy needed more information. Information given. Nothing further is needed at this time.

## 2018-09-10 ENCOUNTER — Telehealth: Payer: Self-pay | Admitting: Pulmonary Disease

## 2018-09-10 NOTE — Telephone Encounter (Signed)
Fax received from [facility] stating that PA for Symbicort was denied d/t needing to try and fail at least 2 of the covered alternatives.  These covered alternatives are: Advair, Breo, Dulera, or fluticasone/salmeterol.  BQ please advise on what covered alternative you prefer.  Thanks!

## 2018-09-13 MED ORDER — FLUTICASONE FUROATE-VILANTEROL 200-25 MCG/INH IN AEPB: 1.0000 | INHALATION_SPRAY | Freq: Every day | RESPIRATORY_TRACT | 5 refills | Status: DC

## 2018-09-13 NOTE — Telephone Encounter (Signed)
Breo 200

## 2018-09-13 NOTE — Telephone Encounter (Signed)
Pt is calling back [phone]

## 2018-09-13 NOTE — Telephone Encounter (Signed)
Spoke with pt, aware of recs.  rx sent to preferred pharmacy.  Nothing further needed at this time- will close encounter.   

## 2018-09-13 NOTE — Telephone Encounter (Signed)
Attempted to call Patient.  Left message to call back. 

## 2018-10-25 ENCOUNTER — Telehealth: Payer: Self-pay | Admitting: Internal Medicine

## 2018-10-25 NOTE — Telephone Encounter (Signed)
PCP out of office sick. Needs visit w/ another provider.

## 2018-10-25 NOTE — Telephone Encounter (Signed)
Copied from [place] [phone]. Topic: Quick Communication - See Telephone Encounter >> Oct 25, 2018  8:52 AM Nils Flack wrote: CRM for notification. See Telephone encounter for: 10/25/18. Pt said she woke up this am wit sore throat.  She denies fever.  She does not want to come in because of virus.  She is asking for something to be called in.  Please call [phone]

## 2018-10-26 NOTE — Telephone Encounter (Signed)
Pt called- she does not want to come into office for visit. Would like recommendations or something called in.

## 2018-10-26 NOTE — Telephone Encounter (Signed)
In addition to continue her normal COPD medications she needs to: Rest, drink plenty fluids, take Tylenol etc. for sore throat. If she develops a fever, increased cough and sputum production from baseline, sinus pressure or others symptoms, needs to call

## 2018-10-26 NOTE — Telephone Encounter (Signed)
Pt refused to come due to her Age and COPD condition. She requested that CMA call her to give her home or over the counter suggestion to heal from home.. CMA Agreed to call pt

## 2018-10-26 NOTE — Telephone Encounter (Signed)
Spoke w/ Pt- informed of recommendations. She will let us know if not improving.

## 2018-10-26 NOTE — Telephone Encounter (Signed)
Will call- waiting for PCP recommendations.

## 2018-11-03 ENCOUNTER — Telehealth (INDEPENDENT_AMBULATORY_CARE_PROVIDER_SITE_OTHER): Payer: Medicare Other | Admitting: Primary Care

## 2018-11-03 ENCOUNTER — Other Ambulatory Visit: Payer: Self-pay

## 2018-11-03 DIAGNOSIS — J069 Acute upper respiratory infection, unspecified: Secondary | ICD-10-CM | POA: Diagnosis not present

## 2018-11-03 DIAGNOSIS — J309 Allergic rhinitis, unspecified: Secondary | ICD-10-CM

## 2018-11-03 MED ORDER — LORATADINE 10 MG PO TABS: 10.0000 mg | ORAL_TABLET | Freq: Every day | ORAL | 11 refills | Status: DC

## 2018-11-03 MED ORDER — FLUTICASONE PROPIONATE 50 MCG/ACT NA SUSP: 2.0000 | Freq: Every day | NASAL | 3 refills | Status: DC

## 2018-11-03 MED ORDER — AZITHROMYCIN 250 MG PO TABS: ORAL_TABLET | ORAL | 0 refills | Status: DC

## 2018-11-03 NOTE — Patient Instructions (Addendum)
Thank you for allowing me to speak with you today regarding your allergy symptoms and sore throat   Recommendations: Continue BREO inhaler once daily Continue flonase nasal spray daily Adding Loratidine (claritin) take daily Zpack sent to pharmacy   Recommendations: - Avoid clearing your throat, rest voice if possible for three days - NO MINT OR MENTHOL PRODUCTS  - USE SUGARLESS CANDY INSTEAD ([place] or Stover's or Life Savers) or even ice chips will also do - The key is to swallow to prevent all throat clearing - NO OIL BASED VITAMINS   Follow-up: Dr. Lake Bells in 3-4 months (June or July)   Allergies, Adult An allergy means that your body reacts to something that bothers it (allergen). It is not a normal reaction. This can happen from something that you:  Eat.  Breathe in.  Touch. You can have an allergy (be allergic) to:  Outdoor things, like: ? Pollen. ? Grass. ? Weeds.  Indoor things, like: ? Dust. ? Smoke. ? Pet dander.  Foods.  Medicines.  Things that bother your skin, like: ? Detergents. ? Chemicals. ? Latex.  Perfume.  Bugs. An allergy cannot spread from person to person (is not contagious). Follow these instructions at home:         Stay away from things that you know you are allergic to.  If you have allergies to things in the air, wash out your nose each day. Do it with one of these: ? A salt-water (saline) spray. ? A container (neti pot).  Take over-the-counter and prescription medicines only as told by your doctor.  Keep all follow-up visits as told by your doctor. This is important.  If you are at risk for a very bad allergy reaction (anaphylaxis), keep an auto-injector with you all the time. This is called an epinephrine injection. ? This is pre-measured medicine with a needle. You can put it into your skin by yourself. ? Right after you have a very bad allergy reaction, you or a person with you must give the medicine in  less than a few minutes. This is an emergency.  If you have ever had a very bad allergy reaction, wear a medical alert bracelet or necklace. Your very bad allergy should be written on it. Contact a health care provider if:  Your symptoms do not get better with treatment. Get help right away if:  You have symptoms of a very bad allergy reaction. These include: ? A swollen mouth, tongue, or throat. ? Pain or tightness in your chest. ? Trouble breathing. ? Being short of breath. ? Dizziness. ? Fainting. ? Very bad pain in your belly (abdomen). ? Throwing up (vomiting). ? Watery poop (diarrhea). Summary  An allergy means that your body reacts to something that bothers it (allergen). It is not a normal reaction.  Stay away from things that make your body react.  Take over-the-counter and prescription medicines only as told by your doctor.  If you are at risk for a very bad allergy reaction, carry an auto-injector (epinephrine injection) all the time. Also, wear a medical alert bracelet or necklace so people know about your allergy. This information is not intended to replace advice given to you by your health care provider. Make sure you discuss any questions you have with your health care provider. Document Released: 11/22/2012 Document Revised: 11/10/2016 Document Reviewed: 11/10/2016 Elsevier Interactive Patient Education  2019 [employer].  Sore Throat When you have a sore throat, your throat may feel:  Tender.  Burning.  Irritated.  Scratchy.  Painful when you swallow.  Painful when you talk. Many things can cause a sore throat, such as:  An infection.  Allergies.  Dry air.  Smoke or pollution.  Radiation treatment.  Gastroesophageal reflux disease (GERD).  A tumor. A sore throat can be the first sign of another sickness. It can happen with other problems, like:  Coughing.  Sneezing.  Fever.  Swelling in the neck. Most sore throats go away  without treatment. Follow these instructions at home:      Take over-the-counter medicines only as told by your doctor. ? If your child has a sore throat, do not give your child aspirin.  Drink enough fluids to keep your pee (urine) pale yellow.  Rest when you feel you need to.  To help with pain: ? Sip warm liquids, such as broth, herbal tea, or warm water. ? Eat or drink cold or frozen liquids, such as frozen ice pops. ? Gargle with a salt-water mixture 3-4 times a day or as needed. To make a salt-water mixture, add -1 tsp (3-6 g) of salt to 1 cup (237 mL) of warm water. Mix it until you cannot see the salt anymore. ? Suck on hard candy or throat lozenges. ? Put a cool-mist humidifier in your bedroom at night. ? Sit in the bathroom with the door closed for 5-10 minutes while you run hot water in the shower.  Do not use any products that contain nicotine or tobacco, such as cigarettes, e-cigarettes, and chewing tobacco. If you need help quitting, ask your doctor.  Wash your hands well and often with soap and water. If soap and water are not available, use hand sanitizer. Contact a doctor if:  You have a fever for more than 2-3 days.  You keep having symptoms for more than 2-3 days.  Your throat does not get better in 7 days.  You have a fever and your symptoms suddenly get worse.  Your child who is 3 months to 55 years old has a temperature of 102.99F (39C) or higher. Get help right away if:  You have trouble breathing.  You cannot swallow fluids, soft foods, or your saliva.  You have swelling in your throat or neck that gets worse.  You keep feeling sick to your stomach (nauseous).  You keep throwing up (vomiting). Summary  A sore throat is pain, burning, irritation, or scratchiness in the throat. Many things can cause a sore throat.  Take over-the-counter medicines only as told by your doctor. Do not give your child aspirin.  Drink plenty of fluids, and rest as  needed.  Contact a doctor if your symptoms get worse or your sore throat does not get better within 7 days. This information is not intended to replace advice given to you by your health care provider. Make sure you discuss any questions you have with your health care provider. Document Released: 05/06/2008 Document Revised: 12/28/2017 Document Reviewed: 12/28/2017 Elsevier Interactive Patient Education  2019 [employer].

## 2018-11-03 NOTE — Progress Notes (Signed)
Virtual Visit via Telephone Note  I connected with Mary Jordan on 11/03/18 at 10:15 AM EDT by telephone and verified that I am speaking with the correct person using two identifiers.    I discussed the limitations, risks, security and privacy concerns of performing an evaluation and management service by telephone and the availability of in person appointments. I also discussed with the patient that there may be a patient responsible charge related to this service. The patient expressed understanding and agreed to proceed.   History of Present Illness: 84 year old female, former smoker. PMH significant for allergic rhinitis, COPD, LPR. Patient of Dr. Lake Bells, last seen in December. Symbicort changed to Breo d/t cost. Combivent was denied.   Patient at home, agreeing to E-[facility]. Myself and Wilson Singer participated in phone call today. My nurse Lattie Haw to set up follow-up visit and mail AVS.   Patient complaints of sore throat, PND and voice hoarseness x 1 month. States that she tried off BREO inhaler twice last week with no improvement. Using flonase and over the counter combination allergy medication only as needed.  States that her breathing is at baseline. No significant cough. Denies thrush symptoms. No fever.     Observations/Objective:  Observations: - Moderate voice hoarseness noticed  - No overt shortness of breath or wheezing heard during phone call  Assessment and Plan:  Allergic rhinitis - Continue flonase nasal spray - Add Claritin daily  URI/acute pharyngitis - Zpack   COPD -Continue Breo 1 puff daily (BREO inhaler unlikely to be causing voice hoarseness as symptoms started before inhaler was changed from Symbicort to Breo) - Continue Ventolin q4-6 hours prn sob/wheezing - Consider adding Incruse or Spiriva if breathing is no better  - Instructed patient to rinse mouth after inhaler use, no thrush symptoms reported   Follow Up Instructions:   3-4 months with  Dr. Lake Bells    I discussed the assessment and treatment plan with the patient. The patient was provided an opportunity to ask questions and all were answered. The patient agreed with the plan and demonstrated an understanding of the instructions.   The patient was advised to call back or seek an in-person evaluation if the symptoms worsen or if the condition fails to improve as anticipated.  I provided 30 minutes of non-face-to-face time during this encounter.   Martyn Ehrich, NP

## 2018-11-06 NOTE — Progress Notes (Signed)
Reviewed, agree 

## 2018-12-14 ENCOUNTER — Ambulatory Visit: Payer: Medicare Other | Admitting: Internal Medicine

## 2018-12-30 ENCOUNTER — Ambulatory Visit: Payer: Medicare Other | Admitting: Pulmonary Disease

## 2019-03-11 ENCOUNTER — Ambulatory Visit (INDEPENDENT_AMBULATORY_CARE_PROVIDER_SITE_OTHER): Payer: Medicare Other | Admitting: Internal Medicine

## 2019-03-11 ENCOUNTER — Other Ambulatory Visit: Payer: Self-pay

## 2019-03-11 ENCOUNTER — Other Ambulatory Visit: Payer: Self-pay | Admitting: Internal Medicine

## 2019-03-11 DIAGNOSIS — Z09 Encounter for follow-up examination after completed treatment for conditions other than malignant neoplasm: Secondary | ICD-10-CM | POA: Diagnosis not present

## 2019-03-11 MED ORDER — SIMVASTATIN 10 MG PO TABS: 10.0000 mg | ORAL_TABLET | Freq: Every day | ORAL | 1 refills | Status: DC

## 2019-03-11 MED ORDER — CLORAZEPATE DIPOTASSIUM 3.75 MG PO TABS: 3.7500 mg | ORAL_TABLET | Freq: Every evening | ORAL | 5 refills | Status: DC | PRN

## 2019-03-11 NOTE — Progress Notes (Signed)
Subjective:    Patient ID: Mary Jordan, female    DOB: [birth year], 84 y.o.   MRN: [id number]  DOS:  03/11/2019 Type of visit - description: Attempted  to make this a video visit, due to technical difficulties from the patient side it was not possible  thus we proceeded with a Virtual Visit via Telephone    I connected with@   by telephone and verified that I am speaking with the correct person using two identifiers.  THIS ENCOUNTER IS A VIRTUAL VISIT DUE TO COVID-19 - PATIENT WAS NOT SEEN IN THE OFFICE. PATIENT HAS CONSENTED TO VIRTUAL VISIT / TELEMEDICINE VISIT   Location of patient: home  Location of provider: office  I discussed the limitations, risks, security and privacy concerns of performing an evaluation and management service by telephone and the availability of in person appointments. I also discussed with the patient that there may be a patient responsible charge related to this service. The patient expressed understanding and agreed to proceed.   History of Present Illness: Routine visit Since the last time she was seen in November, she is doing well. In the last few months has been on strict quarantine. COPD: Well-controlled Insomnia: Needs a refill on Tranxene High cholesterol: Needs a refill on simvastatin  Review of Systems  Denies fever chills. No chest pain, palpitations No lower extremity edema Shortness of breath at baseline, on and off. No cough Past Medical History:  Diagnosis Date  . Allergic rhinitis   . Anemia   . B12 deficiency   . COPD (chronic obstructive pulmonary disease) ([place])   . Hyperlipidemia   . Insomnia   . Laryngopharyngeal reflux (LPR)   . Lung mass    s/p excision of RUL benign 2007  . Menopause   . Osteoarthritis   . Osteoporosis     Past Surgical History:  Procedure Laterality Date  . ABDOMINAL HYSTERECTOMY    . BREAST BIOPSY     remotely, neg  . BREAST EXCISIONAL BIOPSY Left   . CARDIOVASCULAR STRESS TEST  2007   Cardiolite negative  . CATARACT EXTRACTION, BILATERAL    . lesion from lung excised, benign  05-2006    Social History   Socioeconomic History  . Marital status: Divorced    Spouse name: Not on file  . Number of children: 2  . Years of education: Not on file  . Highest education level: Not on file  Occupational History  . Occupation: Retired    Fish farm manager: RETIRED    CommentChiropodist  Social Needs  . Financial resource strain: Not on file  . Food insecurity    Worry: Not on file    Inability: Not on file  . Transportation needs    Medical: Not on file    Non-medical: Not on file  Tobacco Use  . Smoking status: Former Smoker    Packs/day: 1.00    Years: 39.00    Pack years: 39.00    Types: Cigarettes    Quit date: 08/11/1996    Years since quitting: 22.6  . Smokeless tobacco: Never Used  . Tobacco comment:    Substance and Sexual Activity  . Alcohol use: Yes    Alcohol/week: 6.0 standard drinks    Types: 3 Glasses of wine, 3 Cans of beer per week    Comment: three times weekly  . Drug use: No  . Sexual activity: Not on file  Lifestyle  . Physical activity    Days per week: Not  on file    Minutes per session: Not on file  . Stress: Not on file  Relationships  . Social Herbalist on phone: Not on file    Gets together: Not on file    Attends religious service: Not on file    Active member of club or organization: Not on file    Attends meetings of clubs or organizations: Not on file    Relationship status: Not on file  . Intimate partner violence    Fear of current or ex partner: Not on file    Emotionally abused: Not on file    Physically abused: Not on file    Forced sexual activity: Not on file  Other Topics Concern  . Not on file  Social History Narrative   Lives by herself    Has 2 children, 4 gkids all boys           Allergies as of 03/11/2019      Reactions   Levofloxacin Hives      Medication List       Accurate as of March 11, 2019 11:59 PM. If you have any questions, ask your nurse or doctor.        STOP taking these medications   azithromycin 250 MG tablet Commonly known as: ZITHROMAX Stopped by: Kathlene November, MD     TAKE these medications   clorazepate 3.75 MG tablet Commonly known as: TRANXENE Take 1 tablet (3.75 mg total) by mouth at bedtime as needed. for sleep   diclofenac sodium 1 % Gel Commonly known as: VOLTAREN Apply 4 g topically 4 (four) times daily.   fluticasone 50 MCG/ACT nasal spray Commonly known as: FLONASE Place 2 sprays into both nostrils daily.   fluticasone furoate-vilanterol 200-25 MCG/INH Aepb Commonly known as: Breo Ellipta Inhale 1 puff into the lungs daily.   Ipratropium-Albuterol 20-100 MCG/ACT Aers respimat Commonly known as: Combivent Respimat Inhale 1 puff into the lungs every 6 (six) hours as needed for wheezing or shortness of breath (As needed).   loratadine 10 MG tablet Commonly known as: CLARITIN Take 1 tablet (10 mg total) by mouth daily.   simvastatin 10 MG tablet Commonly known as: ZOCOR Take 1 tablet (10 mg total) by mouth at bedtime.   Ventolin HFA 108 (90 Base) MCG/ACT inhaler Generic drug: albuterol INHALE 1 TO 2 PUFFS INTO THE LUNGS EVERY6 HOURS AS NEEDED FOR WHEEZING           Objective:   Physical Exam There were no vitals taken for this visit. This is phone visit, alert oriented x3, no apparent distress, speaking complete sentences    Assessment     Assessment Hyperlipidemia Insomnia: Chronic, mild COPD B12 deficiency- B12 shots elsewhere Osteoporosis : h/o wrist Fx, T score -2.2  (2013), -2.1 (03-2015) Intolerant to fosamax, actonel $$, took reclast before ~2013 DJD Menopausal Lung mass RUL, s/p excision benign 2007  PLAN Hyperlipidemia: On simvastatin, due for FLP, we agreed not to come to the office for labs due to quarantine Insomnia: Controlled, on prn Tranxene, RF sent COPD: Good med compliance. Preventive care:  Recommend a flu shot when available RTC  CPX 06-2019, in person if possible; if not: virtual.

## 2019-03-13 NOTE — Assessment & Plan Note (Signed)
PLAN Hyperlipidemia: On simvastatin, due for FLP, we agreed not to come to the office for labs due to quarantine Insomnia: Controlled, on prn Tranxene, RF sent COPD: Good med compliance. Preventive care: Recommend a flu shot when available RTC  CPX 06-2019, in person if possible; if not: virtual.

## 2019-03-18 ENCOUNTER — Ambulatory Visit: Payer: Medicare Other | Admitting: Internal Medicine

## 2019-03-25 DIAGNOSIS — Z23 Encounter for immunization: Secondary | ICD-10-CM | POA: Diagnosis not present

## 2019-04-13 ENCOUNTER — Telehealth: Payer: Self-pay | Admitting: Pulmonary Disease

## 2019-04-13 MED ORDER — BREO ELLIPTA 200-25 MCG/INH IN AEPB: 1.0000 | INHALATION_SPRAY | Freq: Every day | RESPIRATORY_TRACT | 1 refills | Status: DC

## 2019-04-13 NOTE — Telephone Encounter (Signed)
Returned call to patient and made aware she needs a visit. Pt does not want to come into office despite precaution being taken. Pt did agree to another televisit as she does not have a computer. Refill submitted to her pharmacy She will f/u next week. Nothing further needed.

## 2019-04-20 ENCOUNTER — Encounter: Payer: Self-pay | Admitting: Primary Care

## 2019-04-20 ENCOUNTER — Other Ambulatory Visit: Payer: Self-pay

## 2019-04-20 ENCOUNTER — Ambulatory Visit (INDEPENDENT_AMBULATORY_CARE_PROVIDER_SITE_OTHER): Payer: Medicare Other | Admitting: Primary Care

## 2019-04-20 DIAGNOSIS — J449 Chronic obstructive pulmonary disease, unspecified: Secondary | ICD-10-CM | POA: Diagnosis not present

## 2019-04-20 NOTE — Progress Notes (Signed)
Virtual Visit via Telephone Note  I connected with Mary Jordan on 04/20/19 at  9:30 AM EDT by telephone and verified that I am speaking with the correct person using two identifiers.  Location: Patient: Home Provider: Office   I discussed the limitations, risks, security and privacy concerns of performing an evaluation and management service by telephone and the availability of in person appointments. I also discussed with the patient that there may be a patient responsible charge related to this service. The patient expressed understanding and agreed to proceed.   History of Present Illness: 84 year old female, former smoker. PMH significant for allergic rhinitis, COPD, LPR. Patient of Dr. Lake Bells, last seen in December. Symbicort changed to Breo d/t cost. Combivent was denied.   Previous [facility] pulmonary encounter: 11/03/18 Patient complaints of sore throat, PND and voice hoarseness x 1 month. States that she tried off BREO inhaler twice last week with no improvement. Using flonase and over the counter combination allergy medication only as needed.  States that her breathing is at baseline. No significant cough. Denies thrush symptoms. No fever.   04/20/2019 Patient contacted today for follow-up visit and needing refills. She continues to be very careful with covid and states that she has not been out of her house. She recovered from acute upper respiratory infection back in March after taking azithromycin. She states that her breathing has been pretty good but she does experience some dyspnea with housework. She reports her allergies are worse. Not currently using Flonase or Claritin. She will be seeing her PCP in December for annual exam. Denies cough or wheezing.   Observations/Objective:  -No shortness of breath, wheezing or cough   Assessment and Plan:  COPD: - Stable interval - Continue Breo 1 puff daily; Albuterol 2 puffs every 6 hours for breakthrough shortness of breath -  Advised annual flu shot   Allergic rhinitis - Continue flonase nasal spray and claritin as needed  Follow Up Instructions:   - FU in 4-6 months with   I discussed the assessment and treatment plan with the patient. The patient was provided an opportunity to ask questions and all were answered. The patient agreed with the plan and demonstrated an understanding of the instructions.   The patient was advised to call back or seek an in-person evaluation if the symptoms worsen or if the condition fails to improve as anticipated.  I provided 15 minutes of non-face-to-face time during this encounter.   Martyn Ehrich, NP

## 2019-04-20 NOTE — Progress Notes (Signed)
Reviewed, agree 

## 2019-04-20 NOTE — Patient Instructions (Addendum)
  COPD: - Stable interval - Continue Breo 1 puff daily; Albuterol 2 puffs every 6 hours for breakthrough shortness of breath * Make sure to get your flu shot   Allergic rhinitis - Continue flonase nasal spray and claritin as needed  Follow-up  - 4-6 months with Dr. Lake Bells or NP if not available

## 2019-05-11 ENCOUNTER — Other Ambulatory Visit: Payer: Self-pay | Admitting: Primary Care

## 2019-06-15 DIAGNOSIS — M25775 Osteophyte, left foot: Secondary | ICD-10-CM | POA: Diagnosis not present

## 2019-06-15 DIAGNOSIS — M2042 Other hammer toe(s) (acquired), left foot: Secondary | ICD-10-CM | POA: Diagnosis not present

## 2019-06-15 DIAGNOSIS — M79675 Pain in left toe(s): Secondary | ICD-10-CM | POA: Diagnosis not present

## 2019-06-15 DIAGNOSIS — M25572 Pain in left ankle and joints of left foot: Secondary | ICD-10-CM | POA: Diagnosis not present

## 2019-07-01 ENCOUNTER — Other Ambulatory Visit: Payer: Self-pay

## 2019-07-06 DIAGNOSIS — M25572 Pain in left ankle and joints of left foot: Secondary | ICD-10-CM | POA: Diagnosis not present

## 2019-07-06 DIAGNOSIS — M65872 Other synovitis and tenosynovitis, left ankle and foot: Secondary | ICD-10-CM | POA: Diagnosis not present

## 2019-07-18 ENCOUNTER — Other Ambulatory Visit: Payer: Self-pay

## 2019-07-19 ENCOUNTER — Ambulatory Visit (INDEPENDENT_AMBULATORY_CARE_PROVIDER_SITE_OTHER): Payer: Medicare Other | Admitting: Internal Medicine

## 2019-07-19 ENCOUNTER — Encounter: Payer: Self-pay | Admitting: Internal Medicine

## 2019-07-19 ENCOUNTER — Other Ambulatory Visit: Payer: Self-pay

## 2019-07-19 VITALS — BP 145/99 | HR 85 | Temp 95.8°F | Resp 16 | Ht 63.0 in | Wt 125.1 lb

## 2019-07-19 DIAGNOSIS — J449 Chronic obstructive pulmonary disease, unspecified: Secondary | ICD-10-CM | POA: Diagnosis not present

## 2019-07-19 DIAGNOSIS — E785 Hyperlipidemia, unspecified: Secondary | ICD-10-CM

## 2019-07-19 DIAGNOSIS — M170 Bilateral primary osteoarthritis of knee: Secondary | ICD-10-CM

## 2019-07-19 DIAGNOSIS — G4700 Insomnia, unspecified: Secondary | ICD-10-CM | POA: Diagnosis not present

## 2019-07-19 DIAGNOSIS — D518 Other vitamin B12 deficiency anemias: Secondary | ICD-10-CM

## 2019-07-19 LAB — COMPREHENSIVE METABOLIC PANEL
ALT: 13 U/L (ref 0–35)
AST: 15 U/L (ref 0–37)
Albumin: 4.3 g/dL (ref 3.5–5.2)
Alkaline Phosphatase: 61 U/L (ref 39–117)
BUN: 9 mg/dL (ref 6–23)
CO2: 30 mEq/L (ref 19–32)
Calcium: 9.3 mg/dL (ref 8.4–10.5)
Chloride: 104 mEq/L (ref 96–112)
Creatinine, Ser: 0.6 mg/dL (ref 0.40–1.20)
GFR: 95.65 mL/min (ref >60.00)
Glucose, Bld: 95 mg/dL (ref 70–99)
Potassium: 4.1 mEq/L (ref 3.5–5.1)
Sodium: 139 mEq/L (ref 135–145)
Total Bilirubin: 0.4 mg/dL (ref 0.2–1.2)
Total Protein: 6.6 g/dL (ref 6.0–8.3)

## 2019-07-19 LAB — CBC WITH DIFFERENTIAL/PLATELET
Basophils Absolute: 0.1 10*3/uL (ref 0.0–0.1)
Basophils Relative: 1.3 % (ref 0.0–3.0)
Eosinophils Absolute: 0.1 10*3/uL (ref 0.0–0.7)
Eosinophils Relative: 1.4 % (ref 0.0–5.0)
HCT: 46.3 % — ABNORMAL HIGH (ref 36.0–46.0)
Hemoglobin: 15 g/dL (ref 12.0–15.0)
Lymphocytes Relative: 20.9 % (ref 12.0–46.0)
Lymphs Abs: 1.4 10*3/uL (ref 0.7–4.0)
MCHC: 32.4 g/dL (ref 30.0–36.0)
MCV: 89.8 fl (ref 78.0–100.0)
Monocytes Absolute: 0.5 10*3/uL (ref 0.1–1.0)
Monocytes Relative: 8 % (ref 3.0–12.0)
Neutro Abs: 4.6 10*3/uL (ref 1.4–7.7)
Neutrophils Relative %: 68.4 % (ref 43.0–77.0)
Platelets: 369 10*3/uL (ref 150.0–400.0)
RBC: 5.15 Mil/uL — ABNORMAL HIGH (ref 3.87–5.11)
RDW: 15 % (ref 11.5–15.5)
WBC: 6.7 10*3/uL (ref 4.0–10.5)

## 2019-07-19 LAB — LIPID PANEL
Cholesterol: 210 mg/dL — ABNORMAL HIGH (ref 0–200)
HDL: 66.3 mg/dL (ref >39.00)
LDL Cholesterol: 124 mg/dL — ABNORMAL HIGH (ref 0–99)
NonHDL: 144.18
Total CHOL/HDL Ratio: 3
Triglycerides: 102 mg/dL (ref 0.0–149.0)
VLDL: 20.4 mg/dL (ref 0.0–40.0)

## 2019-07-19 LAB — VITAMIN B12: Vitamin B-12: >1500 pg/mL — ABNORMAL HIGH (ref 211–911)

## 2019-07-19 NOTE — Progress Notes (Signed)
Subjective:    Patient ID: Mary Jordan, female    DOB: [birth year], 84 y.o.   MRN: [id number]  DOS:  07/19/2019 Type of visit - description: Routine office visit COPD: Note from pulmonary reviewed. Elevated BP: Noted today, no ambulatory BPs at home. Osteoporosis: Results discussed. B12 deficiency: Has not getting her shot in many months, afraid to get out due to Covid, taking oral supplements High cholesterol: Good med compliance, due for labs  BP Readings from Last 3 Encounters:  07/19/19 (!) 146/104  07/13/18 124/80  06/30/18 (!) 149/85     Review of Systems No  chest pain, shortness of breath at baseline. Cough is minimal. No nausea, vomiting, diarrhea No headache or dizziness   Past Medical History:  Diagnosis Date  . Allergic rhinitis   . Anemia   . B12 deficiency   . COPD (chronic obstructive pulmonary disease) ([place])   . Hyperlipidemia   . Insomnia   . Laryngopharyngeal reflux (LPR)   . Lung mass    s/p excision of RUL benign 2007  . Menopause   . Osteoarthritis   . Osteoporosis     Past Surgical History:  Procedure Laterality Date  . ABDOMINAL HYSTERECTOMY    . BREAST BIOPSY     remotely, neg  . BREAST EXCISIONAL BIOPSY Left   . CARDIOVASCULAR STRESS TEST  2007   Cardiolite negative  . CATARACT EXTRACTION, BILATERAL    . lesion from lung excised, benign  05-2006    Social History   Socioeconomic History  . Marital status: Divorced    Spouse name: Not on file  . Number of children: 2  . Years of education: Not on file  . Highest education level: Not on file  Occupational History  . Occupation: Retired    Fish farm manager: RETIRED    CommentChiropodist  Social Needs  . Financial resource strain: Not on file  . Food insecurity    Worry: Not on file    Inability: Not on file  . Transportation needs    Medical: Not on file    Non-medical: Not on file  Tobacco Use  . Smoking status: Former Smoker    Packs/day: 1.00    Years: 39.00    Pack  years: 39.00    Types: Cigarettes    Quit date: 08/11/1996    Years since quitting: 22.9  . Smokeless tobacco: Never Used  . Tobacco comment:    Substance and Sexual Activity  . Alcohol use: Yes    Alcohol/week: 6.0 standard drinks    Types: 3 Glasses of wine, 3 Cans of beer per week    Comment: three times weekly  . Drug use: No  . Sexual activity: Not on file  Lifestyle  . Physical activity    Days per week: Not on file    Minutes per session: Not on file  . Stress: Not on file  Relationships  . Social Herbalist on phone: Not on file    Gets together: Not on file    Attends religious service: Not on file    Active member of club or organization: Not on file    Attends meetings of clubs or organizations: Not on file    Relationship status: Not on file  . Intimate partner violence    Fear of current or ex partner: Not on file    Emotionally abused: Not on file    Physically abused: Not on file    Forced sexual  activity: Not on file  Other Topics Concern  . Not on file  Social History Narrative   Lives by herself    Has 2 children, 4 gkids all boys           Allergies as of 07/19/2019      Reactions   Levofloxacin Hives      Medication List       Accurate as of July 19, 2019  9:42 AM. If you have any questions, ask your nurse or doctor.        Breo Ellipta 200-25 MCG/INH Aepb Generic drug: fluticasone furoate-vilanterol INHALE 1 PUFF INTO THE LUNGS DAILY   clorazepate 3.75 MG tablet Commonly known as: TRANXENE Take 1 tablet (3.75 mg total) by mouth at bedtime as needed. for sleep   fluticasone 50 MCG/ACT nasal spray Commonly known as: FLONASE Place 2 sprays into both nostrils daily.   loratadine 10 MG tablet Commonly known as: CLARITIN Take 1 tablet (10 mg total) by mouth daily.   simvastatin 10 MG tablet Commonly known as: ZOCOR Take 1 tablet (10 mg total) by mouth at bedtime.   Ventolin HFA 108 (90 Base) MCG/ACT inhaler Generic  drug: albuterol INHALE 1 TO 2 PUFFS INTO THE LUNGS EVERY6 HOURS AS NEEDED FOR WHEEZING           Objective:   Physical Exam BP (!) 146/104 (BP Location: Left Arm, Patient Position: Sitting, Cuff Size: Small)   Pulse 85   Temp (!) 95.8 F (35.4 C) (Temporal)   Resp 16   Ht 5\' 3"  (1.6 m)   Wt 125 lb 2 oz (56.8 kg)   SpO2 96%   BMI 22.16 kg/m  General:   Well developed, NAD, BMI noted.  HEENT:  Normocephalic . Face symmetric, atraumatic Lungs:  Decreased breath sounds Normal respiratory effort, no intercostal retractions, no accessory muscle use. Heart: RRR,  no murmur.  no pretibial edema bilaterally  Abdomen:  Not distended, soft, non-tender. No rebound or rigidity.   Skin: Not pale. Not jaundice Neurologic:  alert & oriented X3.  Speech normal, gait appropriate for age and unassisted Psych--  Cognition and judgment appear intact.  Cooperative with normal attention span and concentration.  Behavior appropriate. No anxious or depressed appearing.     Assessment    Assessment Hyperlipidemia Insomnia: Chronic, mild COPD B12 deficiency- B12 shots elsewhere Osteoporosis : h/o wrist Fx, T score -2.2  (2013), -2.1 (03-2015) Intolerant to fosamax, actonel $$, took reclast before ~2013 DJD Menopausal Lung mass RUL, s/p excision benign 2007  PLAN Hyperlipidemia: On simvastatin, check FLP, CMP. Insomnia: On Tranxene as needed only COPD: Seems controlled, check a CBC, last visit with pulmonary 04-2019, felt to be stable. Osteoporosis: T score 01/2018 -2.3.  Risk of fractures discussed.  She will only consider Reclast but for now she declines to proceed, is afraid of COVID-19 and does not like to go to the infusion center. She however remains active swimming, walking, taking vitamin D.  Has a good calcium rich diet. B12 deficiency: Not getting shots for several months due to quarantine, on oral supplements, checking labs. Elevated BP: Recheck 144/99, nurse visit in 2  months to recheck DJD: Also, reports pain at the left great toe, seeing a podiatrist but not making much improvement.  Would like a Ortho referral at some point if needed.  She will call. Preventive care: Declines further mammograms Had a flu shot RTC for nurse visit and BP check in 2 months RTC for office  visit 6 to 8 months    This visit occurred during the SARS-CoV-2 public health emergency.  Safety protocols were in place, including screening questions prior to the visit, additional usage of staff PPE, and extensive cleaning of exam room while observing appropriate contact time as indicated for disinfecting solutions.

## 2019-07-19 NOTE — Progress Notes (Signed)
Pre visit review using our clinic review tool, if applicable. No additional management support is needed unless otherwise documented below in the visit note. 

## 2019-07-19 NOTE — Patient Instructions (Addendum)
Please schedule Medicare Wellness with Glenard Haring.   GO TO THE LAB : Get the blood work     GO TO THE FRONT DESK Schedule your next appointment   for a nurse visit in 2 months to recheck your blood pressure  Schedule a visit with me in 6 to 8 months for a routine checkup.

## 2019-07-20 NOTE — Assessment & Plan Note (Signed)
Hyperlipidemia: On simvastatin, check FLP, CMP. Insomnia: On Tranxene as needed only COPD: Seems controlled, check a CBC, last visit with pulmonary 04-2019, felt to be stable. Osteoporosis: T score 01/2018 -2.3.  Risk of fractures discussed.  She will only consider Reclast but for now she declines to proceed, is afraid of COVID-19 and does not like to go to the infusion center. She however remains active swimming, walking, taking vitamin D.  Has a good calcium rich diet. B12 deficiency: Not getting shots for several months due to quarantine, on oral supplements, checking labs. Elevated BP: Recheck 144/99, nurse visit in 2 months to recheck DJD: Also, reports pain at the left great toe, seeing a podiatrist but not making much improvement.  Would like a Ortho referral at some point if needed.  She will call. Preventive care: Declines further mammograms Had a flu shot RTC for nurse visit and BP check in 2 months RTC for office visit 6 to 8 months

## 2019-08-15 ENCOUNTER — Other Ambulatory Visit: Payer: Self-pay | Admitting: Primary Care

## 2019-08-24 DIAGNOSIS — L97522 Non-pressure chronic ulcer of other part of left foot with fat layer exposed: Secondary | ICD-10-CM | POA: Diagnosis not present

## 2019-08-25 ENCOUNTER — Other Ambulatory Visit: Payer: Self-pay

## 2019-08-25 ENCOUNTER — Ambulatory Visit (INDEPENDENT_AMBULATORY_CARE_PROVIDER_SITE_OTHER): Payer: Medicare Other | Admitting: Primary Care

## 2019-08-25 ENCOUNTER — Encounter: Payer: Self-pay | Admitting: Primary Care

## 2019-08-25 DIAGNOSIS — J449 Chronic obstructive pulmonary disease, unspecified: Secondary | ICD-10-CM

## 2019-08-25 MED ORDER — VENTOLIN HFA 108 (90 BASE) MCG/ACT IN AERS: INHALATION_SPRAY | RESPIRATORY_TRACT | 2 refills | Status: DC

## 2019-08-25 MED ORDER — BREO ELLIPTA 200-25 MCG/INH IN AEPB: 1.0000 | INHALATION_SPRAY | Freq: Every day | RESPIRATORY_TRACT | 5 refills | Status: DC

## 2019-08-25 NOTE — Progress Notes (Signed)
Virtual Visit via Telephone Note  I connected with Mary Jordan on 08/25/19 at  9:00 AM EST by telephone and verified that I am speaking with the correct person using two identifiers.  Location: Patient: Home Provider: Office   I discussed the limitations, risks, security and privacy concerns of performing an evaluation and management service by telephone and the availability of in person appointments. I also discussed with the patient that there may be a patient responsible charge related to this service. The patient expressed understanding and agreed to proceed.   History of Present Illness: 85 year old female, former smoker. PMH significant for allergic rhinitis, COPD, LPR. Patient of Dr. Lake Bells, last seen in December. Symbicort changed to Breo d/t cost. Combivent was denied.   Previous [facility] pulmonary encounter: 11/03/18 Patient complaints of sore throat, PND and voice hoarseness x 1 month. States that she tried off BREO inhaler twice last week with no improvement. Using flonase and over the counter combination allergy medication only as needed.  States that her breathing is at baseline. No significant cough. Denies thrush symptoms. No fever.   04/20/2019 Patient contacted today for follow-up visit and needing refills. She continues to be very careful with covid and states that she has not been out of her house. She recovered from acute upper respiratory infection back in March after taking azithromycin. She states that her breathing has been pretty good but she does experience some dyspnea with housework. She reports her allergies are worse. Not currently using Flonase or Claritin. She will be seeing her PCP in December for annual exam. Denies cough or wheezing.   08/25/2019 Patient contacted today for 4 month fu televisit. She is doing about the same as last visit. Her breathing is baseline, she has occasional bad days in terms of shortness of breath. She feels it might be related to her  allergies, states that the Claritin and flonase nasal spray have helped tremendously. She will use her Albuterol rescue inhaler on these days if she has it with her. [facility] She also walks 48mins a day. She needs refills of her medications today. She is interested in getting COVID vaccine, we provided her with a phone number to contact- she does not have access to a computer.    Observations/Objective:  - No observed shortness of breath, wheezing or cough   Spirometry 2017- FVC 2.4 (90%), FEV1 1.1 (54%), ratio 44  Assessment and Plan:  COPD GOLD II - Stable interval; she experiences occasional shortness of breath  - Remains active by going to the gym and walking daily  - Continue Breo 200 one puff daily; prn albuterol hfa 2 puffs every 4-6 hours for sob - Needs repeat full PFTs in 3-4 months (consider adding LAMA or changing to Trelegy)  Allergic rhinitis - Improved - Continue Claritin and flonase nasal spray   Follow Up Instructions:  - 4 months with Dr. Elsworth Soho (former [place])   I discussed the assessment and treatment plan with the patient. The patient was provided an opportunity to ask questions and all were answered. The patient agreed with the plan and demonstrated an understanding of the instructions.   The patient was advised to call back or seek an in-person evaluation if the symptoms worsen or if the condition fails to improve as anticipated.  I provided 18 minutes of non-face-to-face time during this encounter.   Martyn Ehrich, NP

## 2019-08-25 NOTE — Patient Instructions (Addendum)
Recommendations: Continue Breo one puff daily Use albuterol 2 puffs every 4-6 hours for shortness of breath/wheezing Continue Claritin and Flonase nasal spray  Orders: PFTs in 3-4 months   Follow-up: FU in 4 months - Needs to establish with new [facility] pulmonary doctor (former [place])    COPD and Physical Activity Chronic obstructive pulmonary disease (COPD) is a long-term (chronic) condition that affects the lungs. COPD is a general term that can be used to describe many different lung problems that cause lung swelling (inflammation) and limit airflow, including chronic bronchitis and emphysema. The main symptom of COPD is shortness of breath, which makes it harder to do even simple tasks. This can also make it harder to exercise and be active. Talk with your health care provider about treatments to help you breathe better and actions you can take to prevent breathing problems during physical activity. What are the benefits of exercising with COPD? Exercising regularly is an important part of a healthy lifestyle. You can still exercise and do physical activities even though you have COPD. Exercise and physical activity improve your shortness of breath by increasing blood flow (circulation). This causes your heart to pump more oxygen through your body. Moderate exercise can improve your:  Oxygen use.  Energy level.  Shortness of breath.  Strength in your breathing muscles.  Heart health.  Sleep.  Self-esteem and feelings of self-worth.  Depression, stress, and anxiety levels. Exercise can benefit everyone with COPD. The severity of your disease may affect how hard you can exercise, especially at first, but everyone can benefit. Talk with your health care provider about how much exercise is safe for you, and which activities and exercises are safe for you. What actions can I take to prevent breathing problems during physical activity?  Sign up for a pulmonary rehabilitation program.  This type of program may include: ? Education about lung diseases. ? Exercise classes that teach you how to exercise and be more active while improving your breathing. This usually involves:  Exercise using your lower extremities, such as a stationary bicycle.  About 30 minutes of exercise, 2 to 5 times per week, for 6 to 12 weeks  Strength training, such as push ups or leg lifts. ? Nutrition education. ? Group classes in which you can talk with others who also have COPD and learn ways to manage stress.  If you use an oxygen tank, you should use it while you exercise. Work with your health care provider to adjust your oxygen for your physical activity. Your resting flow rate is different from your flow rate during physical activity.  While you are exercising: ? Take slow breaths. ? Pace yourself and do not try to go too fast. ? Purse your lips while breathing out. Pursing your lips is similar to a kissing or whistling position. ? If doing exercise that uses a quick burst of effort, such as weight lifting:  Breathe in before starting the exercise.  Breathe out during the hardest part of the exercise (such as raising the weights). Where to find support You can find support for exercising with COPD from:  Your health care provider.  A pulmonary rehabilitation program.  Your local health department or community health programs.  Support groups, online or in-person. Your health care provider may be able to recommend support groups. Where to find more information You can find more information about exercising with COPD from:  American Lung Association: ClassInsider.se.  COPD Foundation: https://www.rivera.net/. Contact a health care provider if:  Your symptoms get worse.  You have chest pain.  You have nausea.  You have a fever.  You have trouble talking or catching your breath.  You want to start a new exercise program or a new activity. Summary  COPD is a general term that can  be used to describe many different lung problems that cause lung swelling (inflammation) and limit airflow. This includes chronic bronchitis and emphysema.  Exercise and physical activity improve your shortness of breath by increasing blood flow (circulation). This causes your heart to provide more oxygen to your body.  Contact your health care provider before starting any exercise program or new activity. Ask your health care provider what exercises and activities are safe for you. This information is not intended to replace advice given to you by your health care provider. Make sure you discuss any questions you have with your health care provider. Document Revised: 11/17/2018 Document Reviewed: 08/20/2017 Elsevier Patient Education  2020 [employer].

## 2019-09-07 DIAGNOSIS — L97522 Non-pressure chronic ulcer of other part of left foot with fat layer exposed: Secondary | ICD-10-CM | POA: Diagnosis not present

## 2019-09-20 ENCOUNTER — Ambulatory Visit: Payer: Medicare Other | Attending: Internal Medicine

## 2019-09-21 ENCOUNTER — Other Ambulatory Visit: Payer: Self-pay

## 2019-09-21 ENCOUNTER — Telehealth: Payer: Self-pay | Admitting: Internal Medicine

## 2019-09-21 ENCOUNTER — Ambulatory Visit (INDEPENDENT_AMBULATORY_CARE_PROVIDER_SITE_OTHER): Payer: Medicare Other

## 2019-09-21 VITALS — BP 158/90 | HR 84

## 2019-09-21 DIAGNOSIS — R030 Elevated blood-pressure reading, without diagnosis of hypertension: Secondary | ICD-10-CM

## 2019-09-21 MED ORDER — AMLODIPINE BESYLATE 5 MG PO TABS: 5.0000 mg | ORAL_TABLET | Freq: Every day | ORAL | 3 refills | Status: DC

## 2019-09-21 NOTE — Telephone Encounter (Signed)
Patient having problems with normal BM's daily. Would like PCP advisement on something to take daily that would be of help. She has tried suppositories but does not like using them

## 2019-09-21 NOTE — Telephone Encounter (Signed)
Called and scheduled her an appointment for Friday 09/23/19.

## 2019-09-21 NOTE — Progress Notes (Signed)
Pt here for Blood pressure check per MD order Dr. Larose Kells  Pt currently takes: None     BP today @ 10:30 am=  158/ 90 right arm and left arm 162/88 HR = 84  Pt advised per DR. Paz send in Amlodipine 5 mg once daily #30 with 3 refills. Check BP once a week for 3 weeks and let PCP know results. Schedule OV with PCP in 6 weeks for BP Check. Advise patient to purchase a BP cuff from pharmacy and patient verbalized understanding.

## 2019-09-21 NOTE — Telephone Encounter (Signed)
Informed the patient of PCP instructions. She verbalized understanding. Also, she forgot to mention the past 2 nights has had a bad headache in the back of her head and also was on a step stool to fix the battery in her clock and looking up at the ceiling became dizzy. I did inform her to stay off a step stool and wait for her son to fix her clock. She did want to know if the Headache's/dizziness could be due to her BP being elevated

## 2019-09-21 NOTE — Telephone Encounter (Signed)
For constipation recommend: Good hydration If she is taking calcium, stop the supplement. Okay to take vitamin D MiraLAX 17 g daily for a couple of weeks then as needed. If severe symptoms or stomach pain needs to be seen.

## 2019-09-21 NOTE — Telephone Encounter (Signed)
I doubt the headache and dizziness were related to elevated BP. Needs to be seen, please arrange a visit. Urgent care if  symptoms severe

## 2019-09-22 ENCOUNTER — Ambulatory Visit: Payer: Medicare Other

## 2019-09-23 ENCOUNTER — Emergency Department (HOSPITAL_COMMUNITY): Payer: Medicare Other

## 2019-09-23 ENCOUNTER — Emergency Department (HOSPITAL_COMMUNITY)
Admission: EM | Admit: 2019-09-23 | Discharge: 2019-09-23 | Disposition: A | Discharge status: Home / Self Care | Payer: Medicare Other | Attending: Emergency Medicine | Admitting: Emergency Medicine

## 2019-09-23 ENCOUNTER — Encounter (HOSPITAL_COMMUNITY): Payer: Self-pay

## 2019-09-23 ENCOUNTER — Encounter: Payer: Self-pay | Admitting: Internal Medicine

## 2019-09-23 ENCOUNTER — Ambulatory Visit (INDEPENDENT_AMBULATORY_CARE_PROVIDER_SITE_OTHER): Payer: Medicare Other | Admitting: Internal Medicine

## 2019-09-23 ENCOUNTER — Other Ambulatory Visit: Payer: Self-pay

## 2019-09-23 ENCOUNTER — Telehealth: Payer: Self-pay

## 2019-09-23 ENCOUNTER — Ambulatory Visit (HOSPITAL_BASED_OUTPATIENT_CLINIC_OR_DEPARTMENT_OTHER)
Admission: RE | Admit: 2019-09-23 | Discharge: 2019-09-23 | Disposition: A | Discharge status: Home / Self Care | Payer: Medicare Other | Source: Ambulatory Visit | Attending: Internal Medicine | Admitting: Internal Medicine

## 2019-09-23 VITALS — BP 145/72 | HR 88 | Temp 96.4°F | Resp 18 | Ht 63.0 in | Wt 128.5 lb

## 2019-09-23 DIAGNOSIS — R930 Abnormal findings on diagnostic imaging of skull and head, not elsewhere classified: Secondary | ICD-10-CM | POA: Diagnosis not present

## 2019-09-23 DIAGNOSIS — J449 Chronic obstructive pulmonary disease, unspecified: Secondary | ICD-10-CM | POA: Insufficient documentation

## 2019-09-23 DIAGNOSIS — R42 Dizziness and giddiness: Secondary | ICD-10-CM

## 2019-09-23 DIAGNOSIS — I10 Essential (primary) hypertension: Secondary | ICD-10-CM | POA: Diagnosis not present

## 2019-09-23 DIAGNOSIS — G4452 New daily persistent headache (NDPH): Secondary | ICD-10-CM

## 2019-09-23 DIAGNOSIS — R519 Headache, unspecified: Secondary | ICD-10-CM | POA: Diagnosis not present

## 2019-09-23 DIAGNOSIS — Z79899 Other long term (current) drug therapy: Secondary | ICD-10-CM | POA: Diagnosis not present

## 2019-09-23 DIAGNOSIS — R000 Tachycardia, unspecified: Secondary | ICD-10-CM | POA: Diagnosis not present

## 2019-09-23 DIAGNOSIS — Z87891 Personal history of nicotine dependence: Secondary | ICD-10-CM | POA: Insufficient documentation

## 2019-09-23 HISTORY — DX: Essential (primary) hypertension: I10

## 2019-09-23 LAB — CBC
HCT: 46.4 % — ABNORMAL HIGH (ref 36.0–46.0)
Hemoglobin: 14.7 g/dL (ref 12.0–15.0)
MCH: 29.1 pg (ref 26.0–34.0)
MCHC: 31.7 g/dL (ref 30.0–36.0)
MCV: 91.9 fL (ref 80.0–100.0)
Platelets: 384 10*3/uL (ref 150–400)
RBC: 5.05 MIL/uL (ref 3.87–5.11)
RDW: 12.9 % (ref 11.5–15.5)
WBC: 8.3 10*3/uL (ref 4.0–10.5)
nRBC: 0 % (ref 0.0–0.2)

## 2019-09-23 LAB — DIFFERENTIAL
Abs Immature Granulocytes: 0.03 10*3/uL (ref 0.00–0.07)
Basophils Absolute: 0.1 10*3/uL (ref 0.0–0.1)
Basophils Relative: 1 %
Eosinophils Absolute: 0.1 10*3/uL (ref 0.0–0.5)
Eosinophils Relative: 1 %
Immature Granulocytes: 0 %
Lymphocytes Relative: 14 %
Lymphs Abs: 1.2 10*3/uL (ref 0.7–4.0)
Monocytes Absolute: 0.6 10*3/uL (ref 0.1–1.0)
Monocytes Relative: 7 %
Neutro Abs: 6.3 10*3/uL (ref 1.7–7.7)
Neutrophils Relative %: 77 %

## 2019-09-23 LAB — PROTIME-INR
INR: 0.9 (ref 0.8–1.2)
Prothrombin Time: 12.2 seconds (ref 11.4–15.2)

## 2019-09-23 LAB — APTT: aPTT: 31 seconds (ref 24–36)

## 2019-09-23 LAB — COMPREHENSIVE METABOLIC PANEL
ALT: 19 U/L (ref 0–44)
AST: 20 U/L (ref 15–41)
Albumin: 4.2 g/dL (ref 3.5–5.0)
Alkaline Phosphatase: 56 U/L (ref 38–126)
Anion gap: 11 (ref 5–15)
BUN: 8 mg/dL (ref 8–23)
CO2: 25 mmol/L (ref 22–32)
Calcium: 9.6 mg/dL (ref 8.9–10.3)
Chloride: 106 mmol/L (ref 98–111)
Creatinine, Ser: 0.61 mg/dL (ref 0.44–1.00)
GFR calc Af Amer: >60 mL/min (ref >60)
GFR calc non Af Amer: >60 mL/min (ref >60)
Glucose, Bld: 102 mg/dL — ABNORMAL HIGH (ref 70–99)
Potassium: 3.6 mmol/L (ref 3.5–5.1)
Sodium: 142 mmol/L (ref 135–145)
Total Bilirubin: 0.6 mg/dL (ref 0.3–1.2)
Total Protein: 7.5 g/dL (ref 6.5–8.1)

## 2019-09-23 MED ORDER — SODIUM CHLORIDE 0.9% FLUSH: 3.0000 mL | Freq: Once | INTRAVENOUS | Status: DC

## 2019-09-23 NOTE — Patient Instructions (Signed)
Please go to the first floor and proceed with a CAT scan of your head  Continue taking Tylenol PM as needed  Use heating pad  If you are not better in the next few days let me know  If you have severe headache, fever, chills or any other unusual symptoms: Please call immediately

## 2019-09-23 NOTE — Progress Notes (Signed)
Pre visit review using our clinic review tool, if applicable. No additional management support is needed unless otherwise documented below in the visit note. 

## 2019-09-23 NOTE — ED Provider Notes (Signed)
[facility] EMERGENCY DEPARTMENT Provider Note   CSN: [id number] Arrival date & time: 09/23/19  1408     History No chief complaint on file.   Mary Jordan is a 85 y.o. female.  Patient is an 85 year old female with history of hypertension and hyperlipidemia.  She presents today for evaluation of possible stroke.  Patient reports a 3-day history of persistent occipital headache for which she saw her primary care doctor today.  She then went for a CT scan of the head which she was informed was positive for stroke and that she should come to the ER to be evaluated.  Patient denies to me she is experiencing any weakness or numbness in her extremities.  She does report occasional dizziness.    The history is provided by the patient.       Past Medical History:  Diagnosis Date  . Allergic rhinitis   . Anemia   . B12 deficiency   . COPD (chronic obstructive pulmonary disease) ([place])   . Hyperlipidemia   . Hypertension   . Insomnia   . Laryngopharyngeal reflux (LPR)   . Lung mass    s/p excision of RUL benign 2007  . Menopause   . Osteoarthritis   . Osteoporosis     Patient Active Problem List   Diagnosis Date Noted  . Laryngopharyngeal reflux (LPR) 11/26/2016  . PCP NOTES >>>> 06/30/2015  . COPD (chronic obstructive pulmonary disease) ([place]) 05/31/2015  . Elevated BP without diagnosis of hypertension 11/16/2013  . Annual physical exam 03/26/2011  . CERVICAL RADICULOPATHY, LEFT 10/14/2010  . COPD exacerbation ([place]) 09/05/2010  . Allergic rhinitis 05/29/2010  . ANEMIA, B12 DEFICIENCY 11/05/2007  . Dyslipidemia 10/06/2007  . DJD (degenerative joint disease) 10/06/2007  . Osteoporosis 06/07/2007  . INSOMNIA, CHRONIC, MILD 04/05/2007    Past Surgical History:  Procedure Laterality Date  . ABDOMINAL HYSTERECTOMY    . BREAST BIOPSY     remotely, neg  . BREAST EXCISIONAL BIOPSY Left   . CARDIOVASCULAR STRESS TEST  2007   Cardiolite negative  .  CATARACT EXTRACTION, BILATERAL    . lesion from lung excised, benign  05-2006     OB History   No obstetric history on file.     Family History  Problem Relation Age of Onset  . Heart attack Father 32  . Dementia Mother   . Breast cancer Mother   . Asthma Mother   . Hyperlipidemia Neg Hx   . Diabetes Neg Hx   . Stroke Neg Hx   . Colon cancer Neg Hx     Social History   Tobacco Use  . Smoking status: Former Smoker    Packs/day: 1.00    Years: 39.00    Pack years: 39.00    Types: Cigarettes    Quit date: 08/11/1996    Years since quitting: 23.1  . Smokeless tobacco: Never Used  . Tobacco comment:    Substance Use Topics  . Alcohol use: Yes    Alcohol/week: 6.0 standard drinks    Types: 3 Glasses of wine, 3 Cans of beer per week    Comment: three times weekly  . Drug use: No    Home Medications Prior to Admission medications   Medication Sig Start Date End Date Taking? Authorizing Provider  amLODipine (NORVASC) 5 MG tablet Take 1 tablet (5 mg total) by mouth daily. 09/21/19   Colon Branch, MD  clorazepate (TRANXENE) 3.75 MG tablet Take 1 tablet (3.75 mg total)  by mouth at bedtime as needed. for sleep 03/11/19   Colon Branch, MD  fluticasone furoate-vilanterol (BREO ELLIPTA) 200-25 MCG/INH AEPB Inhale 1 puff into the lungs daily. 08/25/19   Martyn Ehrich, NP  simvastatin (ZOCOR) 10 MG tablet Take 1 tablet (10 mg total) by mouth at bedtime. 03/11/19   Colon Branch, MD  VENTOLIN HFA 108 [phone] Base) MCG/ACT inhaler INHALE 1 TO 2 PUFFS INTO THE LUNGS EVERY6 HOURS AS NEEDED FOR WHEEZING 08/25/19   Martyn Ehrich, NP    Allergies    Levofloxacin  Review of Systems   Review of Systems  All other systems reviewed and are negative.   Physical Exam Updated Vital Signs BP (!) 166/86   Pulse (!) 112   Temp 98.2 F (36.8 C) (Oral)   Resp 18   Ht 5\' 3"  (1.6 m)   Wt 58.3 kg   SpO2 96%   BMI 22.76 kg/m   Physical Exam Vitals and nursing note reviewed.    Constitutional:      General: She is not in acute distress.    Appearance: She is well-developed. She is not diaphoretic.  HENT:     Head: Normocephalic and atraumatic.  Eyes:     Extraocular Movements: Extraocular movements intact.     Pupils: Pupils are equal, round, and reactive to light.  Cardiovascular:     Rate and Rhythm: Normal rate and regular rhythm.     Heart sounds: No murmur. No friction rub. No gallop.   Pulmonary:     Effort: Pulmonary effort is normal. No respiratory distress.     Breath sounds: Normal breath sounds. No wheezing.  Abdominal:     General: Bowel sounds are normal. There is no distension.     Palpations: Abdomen is soft.     Tenderness: There is no abdominal tenderness.  Musculoskeletal:        General: Normal range of motion.     Cervical back: Normal range of motion and neck supple.  Skin:    General: Skin is warm and dry.  Neurological:     General: No focal deficit present.     Mental Status: She is alert and oriented to person, place, and time.     Cranial Nerves: No cranial nerve deficit.     Sensory: No sensory deficit.     Motor: No weakness.     Coordination: Coordination normal.     ED Results / Procedures / Treatments   Labs (all labs ordered are listed, but only abnormal results are displayed) Labs Reviewed  CBC - Abnormal; Notable for the following components:      Result Value   HCT 46.4 (*)    All other components within normal limits  COMPREHENSIVE METABOLIC PANEL - Abnormal; Notable for the following components:   Glucose, Bld 102 (*)    All other components within normal limits  PROTIME-INR  APTT  DIFFERENTIAL    EKG EKG Interpretation  Date/Time:  Friday September 23 2019 14:26:55 EST Ventricular Rate:  107 PR Interval:  140 QRS Duration: 86 QT Interval:  336 QTC Calculation: 448 R Axis:   81 Text Interpretation: Sinus tachycardia Otherwise normal ECG No significant change since 01/05/2015 Confirmed by Veryl Speak [phone]) on 09/23/2019 3:25:14 PM   Radiology CT Head Wo Contrast  Result Date: 09/23/2019 CLINICAL DATA:  Headache and dizziness EXAM: CT HEAD WITHOUT CONTRAST TECHNIQUE: Contiguous axial images were obtained from the base of the skull through the vertex  without intravenous contrast. COMPARISON:  August 04, 2016. FINDINGS: Brain: Mild to moderate diffuse atrophy is stable. There is no intracranial mass, hemorrhage, extra-axial fluid collection, or midline shift. There is stable patchy small vessel disease in the centra semiovale bilaterally. There is a focal area of decreased attenuation involving a portion of the anterior limb and genu of the left internal capsule, age uncertain but potentially recent/acute. No other findings suggesting potential acute infarct. Vascular: No hyperdense vessels. There is calcification in each carotid siphon region. Skull: Bony calvarium appears intact. Sinuses/Orbits: There is mucosal thickening in several ethmoid air cells. There is a concha bullosa on the right, an anatomic variant. Orbits appear symmetric bilaterally. Other: Mastoid air cells are clear. IMPRESSION: 1. Small focus of decreased attenuation involving a portion of the anterior limb and genu of the left internal capsule. This finding is suspicious for recent and possibly acute small infarct in this area. No other findings suggesting potential acute infarct. 2. Stable atrophy with patchy periventricular small vessel disease. No mass or hemorrhage. 3.  There are foci of arterial vascular calcification. 4.  Mucosal thickening noted in several ethmoid air cells. These results will be called to the ordering clinician or representative by the Radiologist Assistant, and communication documented in the PACS or zVision Dashboard. Electronically Signed   By: Lowella Grip III M.D.   On: 09/23/2019 11:33    Procedures Procedures (including critical care time)  Medications Ordered in ED Medications    sodium chloride flush (NS) 0.9 % injection 3 mL (3 mLs Intravenous Not Given 09/23/19 1512)    ED Course  I have reviewed the triage vital signs and the nursing notes.  Pertinent labs & imaging results that were available during my care of the patient were reviewed by me and considered in my medical decision making (see chart for details).    MDM Rules/Calculators/A&P  Patient sent here by her primary doctor for rule out of stroke.  She had a head CT performed as an outpatient today that was suggestive of that.  Patient appears neurologically intact to my exam and MRI of the brain shows no evidence for stroke.  At this point, I feel as though patient is appropriate for discharge.  She is to continue her medications as before and follow-up as needed.  Final Clinical Impression(s) / ED Diagnoses Final diagnoses:  None    Rx / DC Orders ED Discharge Orders    None       Veryl Speak, MD 09/23/19 [phone]

## 2019-09-23 NOTE — ED Triage Notes (Signed)
Patient sent by MD after outpatient CT for occipital headache x 3 days, denies trauma, no associated symptoms. States that she was told she has had a stroke

## 2019-09-23 NOTE — Progress Notes (Signed)
Subjective:    Patient ID: Mary Jordan, female    DOB: May 03, 1935, 85 y.o.   MRN: [id number]  DOS:  09/23/2019 Type of visit - description: Acute Got his first Covid vaccination 4 days ago Shortly after developed a headache, located at the posterior head, it may last hours, worse at night, typically better with Tylenol PM. This is not the worst headache of her life but the intensity has been as high as 6/10.  Also reports dizziness, this has been a chronic issue for a while, only related to "looking up" and is short-lived.  Review of Systems Denies fever chills.  No weight loss.  No visual changes No rash anywhere in the head or neck No nausea, vomiting. No unusual aches or pains No stroke symptoms such as diplopia, slurred speech or motor deficits  Past Medical History:  Diagnosis Date  . Allergic rhinitis   . Anemia   . B12 deficiency   . COPD (chronic obstructive pulmonary disease) ([place])   . Hyperlipidemia   . Insomnia   . Laryngopharyngeal reflux (LPR)   . Lung mass    s/p excision of RUL benign 2007  . Menopause   . Osteoarthritis   . Osteoporosis     Past Surgical History:  Procedure Laterality Date  . ABDOMINAL HYSTERECTOMY    . BREAST BIOPSY     remotely, neg  . BREAST EXCISIONAL BIOPSY Left   . CARDIOVASCULAR STRESS TEST  2007   Cardiolite negative  . CATARACT EXTRACTION, BILATERAL    . lesion from lung excised, benign  05-2006    Allergies as of 09/23/2019      Reactions   Levofloxacin Hives      Medication List       Accurate as of September 23, 2019 10:31 AM. If you have any questions, ask your nurse or doctor.        amLODipine 5 MG tablet Commonly known as: NORVASC Take 1 tablet (5 mg total) by mouth daily.   Breo Ellipta 200-25 MCG/INH Aepb Generic drug: fluticasone furoate-vilanterol Inhale 1 puff into the lungs daily.   clorazepate 3.75 MG tablet Commonly known as: TRANXENE Take 1 tablet (3.75 mg total) by mouth at bedtime as  needed. for sleep   simvastatin 10 MG tablet Commonly known as: ZOCOR Take 1 tablet (10 mg total) by mouth at bedtime.   Ventolin HFA 108 (90 Base) MCG/ACT inhaler Generic drug: albuterol INHALE 1 TO 2 PUFFS INTO THE LUNGS EVERY6 HOURS AS NEEDED FOR WHEEZING             Objective:   Physical Exam BP (!) 145/72 (BP Location: Left Arm, Patient Position: Sitting, Cuff Size: Small)   Pulse 88   Temp (!) 96.4 F (35.8 C) (Temporal)   Resp 18   Ht 5\' 3"  (1.6 m)   Wt 128 lb 8 oz (58.3 kg)   SpO2 97%   BMI 22.76 kg/m  General:   Well developed, NAD, BMI noted. HEENT:  Normocephalic . Face symmetric, atraumatic Neck: Normal carotid pulses.  Range of motion slightly limited but otherwise no pain elicited with moving the neck. Lungs:  Decreased breath sounds Normal respiratory effort, no intercostal retractions, no accessory muscle use. Heart: RRR,  no murmur. MSK: No TTP at the cervical spine. Lower extremities: no pretibial edema bilaterally  Skin: Not pale. Not jaundice Neurologic:  alert & oriented X3.  Speech normal, gait appropriate for age and unassisted. DTR symmetric EOMI Psych--  Cognition  and judgment appear intact.  Cooperative with normal attention span and concentration.  Behavior appropriate. No anxious or depressed appearing.      Assessment     Assessment Hyperlipidemia Insomnia: Chronic, mild COPD B12 deficiency- B12 shots elsewhere Osteoporosis : h/o wrist Fx, T score -2.2  (2013), -2.1 (03-2015) Intolerant to fosamax, actonel $$, took reclast before ~2013 DJD Menopausal Lung mass RUL, s/p excision benign 2007  PLAN Headache: New onset, not the worst of her life but as intense as 6/10.  Review of system is benign with no fever, chills, weight loss or visual disturbances. Related to recent Covid vaccination?. Plan: CT head today, continue Tylenol PM, no further evaluation if CT negative & the symptoms subside.  She will let me know if she  has a intense headache or other symptoms, see AVS. Addendum 12.11 PM CT showing a focus of decreased attenuation, suspicious for recent and possibly acute small infarct. Case was discussed with the neuro hospitalist on-call, they recommended ER as she needs further evaluation with an MRI to confirm or rule out an acute event which is not clear on the CT. Patient called, no answer. 12:44 PM: Spoke with the patient, aware of above, will go to [facility] ER.  ER called, spoke with the charge nurse. Dizziness: Postural, at baseline, likely unrelated to headache. HTN: Started amlodipine a couple of days ago.  (Of note is she was constipated 2 weeks ago thus NOT related to CCB's)  Time spent: 45 minutes, mostly coordinating her care, following up on abnormal CT head, explained the situation to the patient.   This visit occurred during the SARS-CoV-2 public health emergency.  Safety protocols were in place, including screening questions prior to the visit, additional usage of staff PPE, and extensive cleaning of exam room while observing appropriate contact time as indicated for disinfecting solutions.

## 2019-09-23 NOTE — ED Notes (Signed)
Patient transported to MRI 

## 2019-09-23 NOTE — Telephone Encounter (Signed)
Pt called back to let us know that her grandson was coming to take her to ED([place]).

## 2019-09-23 NOTE — Discharge Instructions (Addendum)
Take over-the-counter medications as needed for headache relief.  Return to the emergency department if you develop severe headache, high fever, weakness, or other new and concerning symptoms.

## 2019-09-24 NOTE — Assessment & Plan Note (Signed)
Headache: New onset, not the worst of her life but as intense as 6/10.  Review of system is benign with no fever, chills, weight loss or visual disturbances. Related to recent Covid vaccination?. Plan: CT head today, continue Tylenol PM, no further evaluation if CT negative & the symptoms subside.  She will let me know if she has a intense headache or other symptoms, see AVS. Addendum 12.11 PM CT showing a focus of decreased attenuation, suspicious for recent and possibly acute small infarct. Case was discussed with the neuro hospitalist on-call, they recommended ER as she needs further evaluation with an MRI to confirm or rule out an acute event which is not clear on the CT. Patient called, no answer. 12:44 PM: Spoke with the patient, aware of above, will go to [facility] ER.  ER called, spoke with the charge nurse. Dizziness: Postural, at baseline, likely unrelated to headache. HTN: Started amlodipine a couple of days ago.  (Of note is she was constipated 2 weeks ago thus NOT related to CCB's)

## 2019-10-24 DIAGNOSIS — S91102A Unspecified open wound of left great toe without damage to nail, initial encounter: Secondary | ICD-10-CM | POA: Diagnosis not present

## 2019-10-24 DIAGNOSIS — L03032 Cellulitis of left toe: Secondary | ICD-10-CM | POA: Diagnosis not present

## 2019-10-26 ENCOUNTER — Ambulatory Visit: Payer: Medicare Other | Admitting: Internal Medicine

## 2019-10-27 DIAGNOSIS — L03032 Cellulitis of left toe: Secondary | ICD-10-CM | POA: Diagnosis not present

## 2019-10-27 DIAGNOSIS — S91102D Unspecified open wound of left great toe without damage to nail, subsequent encounter: Secondary | ICD-10-CM | POA: Diagnosis not present

## 2019-10-31 DIAGNOSIS — M79675 Pain in left toe(s): Secondary | ICD-10-CM | POA: Diagnosis not present

## 2019-11-07 DIAGNOSIS — M79675 Pain in left toe(s): Secondary | ICD-10-CM | POA: Diagnosis not present

## 2019-11-16 ENCOUNTER — Telehealth: Payer: Self-pay | Admitting: Internal Medicine

## 2019-11-16 ENCOUNTER — Other Ambulatory Visit: Payer: Self-pay | Admitting: Primary Care

## 2019-11-16 NOTE — Telephone Encounter (Signed)
Clorazepate refill.   Last OV: 09/23/2019 Last Fill: 03/11/2019 #30 and 5RF Pt sig: 1 tab qhs prn UDS: 12/11/2017 Low risk  Needs UDS at next OV

## 2019-11-16 NOTE — Telephone Encounter (Signed)
PDMP okay, prescription sent 

## 2019-11-23 DIAGNOSIS — D4981 Neoplasm of unspecified behavior of retina and choroid: Secondary | ICD-10-CM | POA: Diagnosis not present

## 2019-11-23 DIAGNOSIS — H52223 Regular astigmatism, bilateral: Secondary | ICD-10-CM | POA: Diagnosis not present

## 2019-11-23 DIAGNOSIS — H26491 Other secondary cataract, right eye: Secondary | ICD-10-CM | POA: Diagnosis not present

## 2019-11-23 DIAGNOSIS — Z961 Presence of intraocular lens: Secondary | ICD-10-CM | POA: Diagnosis not present

## 2019-11-23 DIAGNOSIS — H524 Presbyopia: Secondary | ICD-10-CM | POA: Diagnosis not present

## 2019-12-08 DIAGNOSIS — L821 Other seborrheic keratosis: Secondary | ICD-10-CM | POA: Diagnosis not present

## 2019-12-08 DIAGNOSIS — L720 Epidermal cyst: Secondary | ICD-10-CM | POA: Diagnosis not present

## 2019-12-08 DIAGNOSIS — D1801 Hemangioma of skin and subcutaneous tissue: Secondary | ICD-10-CM | POA: Diagnosis not present

## 2019-12-08 DIAGNOSIS — D225 Melanocytic nevi of trunk: Secondary | ICD-10-CM | POA: Diagnosis not present

## 2019-12-08 DIAGNOSIS — L820 Inflamed seborrheic keratosis: Secondary | ICD-10-CM | POA: Diagnosis not present

## 2020-01-03 ENCOUNTER — Telehealth: Payer: Self-pay | Admitting: Internal Medicine

## 2020-01-03 NOTE — Progress Notes (Signed)
  Chronic Care Management   Note  01/03/2020 Name: Mary Jordan MRN: [id number] DOB: [birth year]  Mary Jordan is a 85 y.o. year old female who is a primary care patient of Colon Branch, MD. I reached out to Lew Dawes by phone today in response to a referral sent by [name]'s PCP, Colon Branch, MD.   [name] was given information about Chronic Care Management services today including:  1. CCM service includes personalized support from designated clinical staff supervised by her physician, including individualized plan of care and coordination with other care providers 2. 24/7 contact phone numbers for assistance for urgent and routine care needs. 3. Service will only be billed when office clinical staff spend 20 minutes or more in a month to coordinate care. 4. Only one practitioner may furnish and bill the service in a calendar month. 5. The patient may stop CCM services at any time (effective at the end of the month) by phone call to the office staff.   Patient agreed to services and verbal consent obtained.   This note is not being shared with the patient for the following reason: To respect privacy (The patient or proxy has requested that the information not be shared).  Follow up plan:   Earney Hamburg Upstream Scheduler

## 2020-02-07 DIAGNOSIS — L03032 Cellulitis of left toe: Secondary | ICD-10-CM | POA: Diagnosis not present

## 2020-02-18 ENCOUNTER — Other Ambulatory Visit: Payer: Self-pay | Admitting: Internal Medicine

## 2020-02-20 ENCOUNTER — Other Ambulatory Visit: Payer: Self-pay

## 2020-02-20 DIAGNOSIS — E785 Hyperlipidemia, unspecified: Secondary | ICD-10-CM

## 2020-02-20 DIAGNOSIS — J449 Chronic obstructive pulmonary disease, unspecified: Secondary | ICD-10-CM

## 2020-02-20 DIAGNOSIS — I10 Essential (primary) hypertension: Secondary | ICD-10-CM

## 2020-02-22 ENCOUNTER — Ambulatory Visit: Payer: Medicare Other | Admitting: Pharmacist

## 2020-02-22 ENCOUNTER — Other Ambulatory Visit: Payer: Self-pay

## 2020-02-22 DIAGNOSIS — I10 Essential (primary) hypertension: Secondary | ICD-10-CM

## 2020-02-22 DIAGNOSIS — E785 Hyperlipidemia, unspecified: Secondary | ICD-10-CM

## 2020-02-22 NOTE — Chronic Care Management (AMB) (Signed)
Chronic Care Management Pharmacy  Name: Mary Jordan  MRN: [id number] DOB: [birth year]   Chief Complaint/ HPI  Lew Dawes,  85 y.o. , female presents for their Initial CCM visit with the clinical pharmacist via telephone due to COVID-19 Pandemic.  PCP : Colon Branch, MD  Their chronic conditions include: Hypertension, Hyperlipidemia, COPD, Insomnia  Office Visits: 09/23/19: Visit w/ Dr. Larose Kells - New, persistent HA. Head CT. CT showed possibly acute small infarct. Neuro recommend pt to report to ED for MRI to rule out acute event. Pt to go to [facility].   09/21/19: Nurse visit for BP. BP elevated at 158/90 and 162/88. Started patient on amlodipine 24m daily per Dr. PLarose Kells Consult Visit: 09/07/19: Podiatry visit w/ Dr. PBarkley Bruns ED Visit:  09/23/19: MSidneyshowed no evidence of stroke  Medications: Outpatient Encounter Medications as of 02/22/2020  Medication Sig  . amLODipine (NORVASC) 5 MG tablet Take 1 tablet (5 mg total) by mouth daily.  . clorazepate (TRANXENE) 3.75 MG tablet TAKE ONE TABLET BY MOUTH AT BEDTIME AS NEEDED FOR SLEEP  . fluticasone furoate-vilanterol (BREO ELLIPTA) 200-25 MCG/INH AEPB Inhale 1 puff into the lungs daily.  . simvastatin (ZOCOR) 10 MG tablet Take 1 tablet (10 mg total) by mouth at bedtime.  . VENTOLIN HFA 108 (90 Base) MCG/ACT inhaler INHALE 1 TO 2 PUFFS INTO THE LUNGS EVERY6 HOURS AS NEEDED FOR WHEEZING   No facility-administered encounter medications on file as of 02/22/2020.   SDOH Screenings   Alcohol Screen:   . Last Alcohol Screening Score (AUDIT):   Depression (PHQ2-9): Low Risk   . PHQ-2 Score: 0  Financial Resource Strain: Low Risk   . Difficulty of Paying Living Expenses: Not hard at all  Food Insecurity:   . Worried About RCharity fundraiserin the Last Year:   . [place]in the Last Year:   Housing:   . Last Housing Risk Score:   Physical Activity:   . Days of Exercise per Week:   .  Minutes of Exercise per Session:   Social Connections:   . Frequency of Communication with Friends and Family:   . Frequency of Social Gatherings with Friends and Family:   . Attends Religious Services:   . Active Member of Clubs or Organizations:   . Attends CArchivistMeetings:   .Marland KitchenMarital Status:   Stress:   . Feeling of Stress :   Tobacco Use: Medium Risk  . Smoking Tobacco Use: Former Smoker  . Smokeless Tobacco Use: Never Used  Transportation Needs:   . LFilm/video editor(Medical):   .Marland KitchenLack of Transportation (Non-Medical):      Current Diagnosis/Assessment:  Goals Addressed            This Visit's Progress   . Chronic Care Management Pharmacy Care Plan       CARE PLAN ENTRY (see longitudinal plan of care for additional care plan information)  Current Barriers:  . Chronic Disease Management support, education, and care coordination needs related to Hypertension, Hyperlipidemia, COPD, Insomnia   Hypertension BP Readings from Last 3 Encounters:  09/23/19 (!) 151/93  09/23/19 (!) 145/72  09/21/19 (!) 158/90   . Pharmacist Clinical Goal(s): o Over the next 90 days, patient will work with PharmD and providers to maintain BP goal <140/90 . Current regimen:  o Amlodipine 524mdaily  . Interventions: o Discussed BP goal . Patient self care  activities - Over the next 90 days, patient will: o Check BP 1-2 times per week, document, and provide at future appointments o Ensure daily salt intake < 2300 mg/Ersel Enslin o Maintain hypertension medication regimen.   Hyperlipidemia Lab Results  Component Value Date/Time   LDLCALC 124 (H) 07/19/2019 10:11 AM   LDLDIRECT 186.2 08/02/2012 09:25 AM   . Pharmacist Clinical Goal(s): o Over the next 90 days, patient will work with PharmD and providers to achieve LDL goal < 100 . Current regimen:  o Simvastatin 26m daily . Interventions: o Discussed LDL goal  . Patient self care activities - Over the next 90 days,  patient will: o Maintain cholesterol medication regimen.   Medication management . Pharmacist Clinical Goal(s): o Over the next 90 days, patient will work with PharmD and providers to maintain optimal medication adherence . Current pharmacy: [facility] . Interventions o Comprehensive medication review performed. o Continue current medication management strategy . Patient self care activities - Over the next 90 days, patient will: o Focus on medication adherence by filling and taking medications appropriately  o Take medications as prescribed o Report any questions or concerns to PharmD and/or provider(s)  Initial goal documentation       Social Hx:  Lives alone. No pets.  [facility] [place] 4 Grandchildren 1 Great grandchild (Retail banker  Hypertension   Blood pressure goal <140/90  Office blood pressures are  BP Readings from Last 3 Encounters:  09/23/19 (!) 151/93  09/23/19 (!) 145/72  09/21/19 (!) 158/90    Patient has failed these meds in the past: None noted  Patient is currently controlled per most recent home BP on the following medications:   Amlodipine 557mdaily AM   Patient checks BP at home infrequently  Patient home BP readings are ranging: 02/16/20 133/75 pulse 93  We discussed Blood pressure goal  Plan -Check BP 1-2 times per week and record -Continue current medications   Hyperlipidemia   LDL goal <100  Lipid Panel     Component Value Date/Time   CHOL 210 (H) 07/19/2019 1011   TRIG 102.0 07/19/2019 1011   HDL 66.30 07/19/2019 1011   LDLCALC 124 (H) 07/19/2019 1011   LDLDIRECT 186.2 08/02/2012 0925    Hepatic Function Latest Ref Rng & Units 09/23/2019 07/19/2019 12/11/2017  Total Protein 6.5 - 8.1 g/dL 7.5 6.6 6.8  Albumin 3.5 - 5.0 g/dL 4.2 4.3 4.1  AST 15 - 41 U/L '20 15 20  ' ALT 0 - 44 U/L '19 13 15  ' Alk Phosphatase 38 - 126 U/L 56 61 59  Total Bilirubin 0.3 - 1.2 mg/dL 0.6 0.4 0.4     The ASCVD Risk  score (Goff DC Jr., et al., 2013) failed to calculate for the following reasons:   The 2013 ASCVD risk score is only valid for ages 401o 7967 Patient has failed these meds in past: None noted  Patient is currently uncontrolled on the following medications:  . Simvastatin 1076maily  We discussed:  LDL goal. Noting pt's age agreeable with continuing lifestyle modifications vs increasing statin dose  Plan -Continue current medications   COPD/Tobacco   Last spirometry score: 4/20/20217  FVC 2.4 (90%) FEV1 1.1 (54%) FEV1/FVC (59%)  Eosinophil count:   Lab Results  Component Value Date/Time   EOSPCT 1 09/23/2019 02:43 PM  %  Eos (Absolute):  Lab Results  Component Value Date/Time   EOSABS 0.1 09/23/2019 02:43 PM    Tobacco Status:  Social History   Tobacco Use  Smoking Status Former Smoker  . Packs/Marcena Dias: 1.00  . Years: 39.00  . Pack years: 39.00  . Types: Cigarettes  . Quit date: 08/11/1996  . Years since quitting: 23.5  Smokeless Tobacco Never Used  Tobacco Comment        Patient has failed these meds in past: None noted  Patient is currently controlled on the following medications:   Breo Ellipta 200-57m 1 puff daily Using maintenance inhaler regularly? Yes Frequency of rescue inhaler use:  infrequently (uses more during allergy season)  We discussed: pt scheduled to get spirometry on 02/27/20 and follow up appt with pulm on same date  Plan -Continue current medications   Insomnia     Patient has failed these meds in past: None noted  Patient is currently controlled on the following medications:   Clorazepate 3.758mas needed for sleep  Some weeks doesn't use at all. Other weeks uses every other [place] Delpriore   Plan -Continue current medications   Vaccines   Reviewed and discussed patient's vaccination history.  Patient is up to date on vaccines.   Immunization History  Administered Date(s) Administered  . Influenza Split  05/12/2011, 04/15/2012  . Influenza Whole 06/07/2007, 04/28/2008, 05/25/2009, 06/28/2010  . Influenza, High Dose Seasonal PF 06/12/2016, 04/18/2017, 03/25/2019  . Influenza,inj,Quad PF,6+ Mos 05/30/2013, 04/20/2014, 05/31/2015  . Influenza-Unspecified 04/11/2017, 05/11/2018  . PFIZER SARS-COV-2 Vaccination 09/19/2019  . Pneumococcal Conjugate-13 04/20/2014  . Pneumococcal Polysaccharide-23 04/05/2007, 04/15/2012  . Td 04/05/2007  . Tdap 08/04/2016  . Zoster 11/05/2007  . Zoster Recombinat (Shingrix) 01/14/2018, 05/05/2018   COVID Vaccine Pfizer 09/19/19 10/10/19

## 2020-02-22 NOTE — Patient Instructions (Signed)
Visit Information  Goals Addressed            This Visit's Progress   . Chronic Care Management Pharmacy Care Plan       CARE PLAN ENTRY (see longitudinal plan of care for additional care plan information)  Current Barriers:  . Chronic Disease Management support, education, and care coordination needs related to Hypertension, Hyperlipidemia, COPD, Insomnia   Hypertension BP Readings from Last 3 Encounters:  09/23/19 (!) 151/93  09/23/19 (!) 145/72  09/21/19 (!) 158/90   . Pharmacist Clinical Goal(s): o Over the next 90 days, patient will work with PharmD and providers to maintain BP goal <140/90 . Current regimen:  o Amlodipine 5mg  daily  . Interventions: o Discussed BP goal . Patient self care activities - Over the next 90 days, patient will: o Check BP 1-2 times per week, document, and provide at future appointments o Ensure daily salt intake < 2300 mg/Mary Jordan o Maintain hypertension medication regimen.   Hyperlipidemia Lab Results  Component Value Date/Time   LDLCALC 124 (H) 07/19/2019 10:11 AM   LDLDIRECT 186.2 08/02/2012 09:25 AM   . Pharmacist Clinical Goal(s): o Over the next 90 days, patient will work with PharmD and providers to achieve LDL goal < 100 . Current regimen:  o Simvastatin 10mg  daily . Interventions: o Discussed LDL goal  . Patient self care activities - Over the next 90 days, patient will: o Maintain cholesterol medication regimen.   Medication management . Pharmacist Clinical Goal(s): o Over the next 90 days, patient will work with PharmD and providers to maintain optimal medication adherence . Current pharmacy: [facility] . Interventions o Comprehensive medication review performed. o Continue current medication management strategy . Patient self care activities - Over the next 90 days, patient will: o Focus on medication adherence by filling and taking medications appropriately  o Take medications as prescribed o Report any questions  or concerns to PharmD and/or provider(s)  Initial goal documentation        Mary Jordan was given information about Chronic Care Management services today including:  1. CCM service includes personalized support from designated clinical staff supervised by her physician, including individualized plan of care and coordination with other care providers 2. 24/7 contact phone numbers for assistance for urgent and routine care needs. 3. Standard insurance, coinsurance, copays and deductibles apply for chronic care management only during months in which we provide at least 20 minutes of these services. Most insurances cover these services at 100%, however patients may be responsible for any copay, coinsurance and/or deductible if applicable. This service may help you avoid the need for more expensive face-to-face services. 4. Only one practitioner may furnish and bill the service in a calendar month. 5. The patient may stop CCM services at any time (effective at the end of the month) by phone call to the office staff.  Patient agreed to services and verbal consent obtained.   The patient verbalized understanding of instructions provided today and agreed to receive a mailed copy of patient instruction and/or educational materials. Telephone follow up appointment with pharmacy team member scheduled for: 07/25/2020   Melvenia Beam Mary Jordan, PharmD Clinical Pharmacist [facility] [phone]   Cholesterol Content in Foods Cholesterol is a waxy, fat-like substance that helps to carry fat in the blood. The body needs cholesterol in small amounts, but too much cholesterol can cause damage to the arteries and heart. Most people should eat less than 200 milligrams (mg) of  cholesterol a Mary Jordan. Foods with cholesterol  Cholesterol is found in animal-based foods, such as meat, seafood, and dairy. Generally, low-fat dairy and lean meats have less cholesterol than full-fat dairy and  fatty meats. The milligrams of cholesterol per serving (mg per serving) of common cholesterol-containing foods are listed below. Meat and other proteins  Egg -- one large whole egg has 186 mg.  Veal shank -- 4 oz has 141 mg.  Lean ground [place] (93% lean) -- 4 oz has 118 mg.  Fat-trimmed lamb loin -- 4 oz has 106 mg.  Lean ground beef (90% lean) -- 4 oz has 100 mg.  Lobster -- 3.5 oz has 90 mg.  Pork loin chops -- 4 oz has 86 mg.  Canned salmon -- 3.5 oz has 83 mg.  Fat-trimmed beef top loin -- 4 oz has 78 mg.  Frankfurter -- 1 frank (3.5 oz) has 77 mg.  Crab -- 3.5 oz has 71 mg.  Roasted chicken without skin, white meat -- 4 oz has 66 mg.  Light bologna -- 2 oz has 45 mg.  Deli-cut [place] -- 2 oz has 31 mg.  Canned tuna -- 3.5 oz has 31 mg.  Berniece Salines -- 1 oz has 29 mg.  Oysters and mussels (raw) -- 3.5 oz has 25 mg.  Mackerel -- 1 oz has 22 mg.  Trout -- 1 oz has 20 mg.  Pork sausage -- 1 link (1 oz) has 17 mg.  Salmon -- 1 oz has 16 mg.  Tilapia -- 1 oz has 14 mg. Dairy  Soft-serve ice cream --  cup (4 oz) has 103 mg.  Whole-milk yogurt -- 1 cup (8 oz) has 29 mg.  Cheddar cheese -- 1 oz has 28 mg.  American cheese -- 1 oz has 28 mg.  Whole milk -- 1 cup (8 oz) has 23 mg.  2% milk -- 1 cup (8 oz) has 18 mg.  Cream cheese -- 1 tablespoon (Tbsp) has 15 mg.  Cottage cheese --  cup (4 oz) has 14 mg.  Low-fat (1%) milk -- 1 cup (8 oz) has 10 mg.  Sour cream -- 1 Tbsp has 8.5 mg.  Low-fat yogurt -- 1 cup (8 oz) has 8 mg.  Nonfat Greek yogurt -- 1 cup (8 oz) has 7 mg.  Half-and-half cream -- 1 Tbsp has 5 mg. Fats and oils  Cod liver oil -- 1 tablespoon (Tbsp) has 82 mg.  Butter -- 1 Tbsp has 15 mg.  Lard -- 1 Tbsp has 14 mg.  Bacon grease -- 1 Tbsp has 14 mg.  Mayonnaise -- 1 Tbsp has 5-10 mg.  Margarine -- 1 Tbsp has 3-10 mg. Exact amounts of cholesterol in these foods may vary depending on specific ingredients and brands. Foods without  cholesterol Most plant-based foods do not have cholesterol unless you combine them with a food that has cholesterol. Foods without cholesterol include:  Grains and cereals.  Vegetables.  Fruits.  Vegetable oils, such as olive, canola, and sunflower oil.  Legumes, such as peas, beans, and lentils.  Nuts and seeds.  Egg whites. Summary  The body needs cholesterol in small amounts, but too much cholesterol can cause damage to the arteries and heart.  Most people should eat less than 200 milligrams (mg) of cholesterol a Florencia Zaccaro. This information is not intended to replace advice given to you by your health care provider. Make sure you discuss any questions you have with your health care provider. Document Revised: 07/10/2017 Document  Reviewed: 03/24/2017 Elsevier Patient Education  [employer].

## 2020-02-24 ENCOUNTER — Other Ambulatory Visit (HOSPITAL_COMMUNITY)
Admission: RE | Admit: 2020-02-24 | Discharge: 2020-02-24 | Disposition: A | Discharge status: Home / Self Care | Payer: Medicare Other | Source: Ambulatory Visit | Attending: Pulmonary Disease | Admitting: Pulmonary Disease

## 2020-02-24 DIAGNOSIS — Z20822 Contact with and (suspected) exposure to covid-19: Secondary | ICD-10-CM | POA: Diagnosis not present

## 2020-02-24 DIAGNOSIS — Z01812 Encounter for preprocedural laboratory examination: Secondary | ICD-10-CM | POA: Diagnosis not present

## 2020-02-25 LAB — SARS CORONAVIRUS 2 (TAT 6-24 HRS): SARS Coronavirus 2: NEGATIVE

## 2020-02-27 ENCOUNTER — Encounter: Payer: Self-pay | Admitting: Pulmonary Disease

## 2020-02-27 ENCOUNTER — Ambulatory Visit (INDEPENDENT_AMBULATORY_CARE_PROVIDER_SITE_OTHER): Payer: Medicare Other | Admitting: Pulmonary Disease

## 2020-02-27 ENCOUNTER — Other Ambulatory Visit: Payer: Self-pay

## 2020-02-27 DIAGNOSIS — J449 Chronic obstructive pulmonary disease, unspecified: Secondary | ICD-10-CM

## 2020-02-27 LAB — PULMONARY FUNCTION TEST
DL/VA % pred: 64 %
DL/VA: 2.64 ml/min/mmHg/L
DLCO cor % pred: 63 %
DLCO cor: 11.44 ml/min/mmHg
DLCO unc % pred: 63 %
DLCO unc: 11.44 ml/min/mmHg
FEF 25-75 Post: 0.3 L/sec
FEF 25-75 Pre: 0.39 L/sec
FEF2575-%Change-Post: -23 %
FEF2575-%Pred-Post: 24 %
FEF2575-%Pred-Pre: 31 %
FEV1-%Change-Post: -12 %
FEV1-%Pred-Post: 45 %
FEV1-%Pred-Pre: 52 %
FEV1-Post: 0.81 L
FEV1-Pre: 0.93 L
FEV1FVC-%Change-Post: -6 %
FEV1FVC-%Pred-Pre: 65 %
FEV6-%Change-Post: -5 %
FEV6-%Pred-Post: 78 %
FEV6-%Pred-Pre: 83 %
FEV6-Post: 1.77 L
FEV6-Pre: 1.88 L
FEV6FVC-%Change-Post: 1 %
FEV6FVC-%Pred-Post: 104 %
FEV6FVC-%Pred-Pre: 102 %
FVC-%Change-Post: -7 %
FVC-%Pred-Post: 75 %
FVC-%Pred-Pre: 81 %
FVC-Post: 1.81 L
FVC-Pre: 1.95 L
Post FEV1/FVC ratio: 45 %
Post FEV6/FVC ratio: 98 %
Pre FEV1/FVC ratio: 48 %
Pre FEV6/FVC Ratio: 97 %
RV % pred: 112 %
RV: 2.67 L
TLC % pred: 99 %
TLC: 4.87 L

## 2020-02-27 MED ORDER — BREO ELLIPTA 200-25 MCG/INH IN AEPB: 1.0000 | INHALATION_SPRAY | Freq: Every day | RESPIRATORY_TRACT | 5 refills | Status: DC

## 2020-02-27 NOTE — Addendum Note (Signed)
Addended by: Satira Sark D on: 02/27/2020 03:56 PM   Modules accepted: Orders

## 2020-02-27 NOTE — Progress Notes (Signed)
   Subjective:    Patient ID: Mary Jordan, female    DOB: 1935/05/17, 85 y.o.   MRN: [id number]  HPI  85 yo ex -  smoker With COPD -GOLD C  She had a stable interval, did not see doctors last year but is now back to seeing them. Breathing is stable. PFTs reviewed today. Breo works well for her.  Uses albuterol as needed   Significant tests/ events reviewed  FeV1 57% 04/2012 11/2015 Spirometry fev1 54%  PFTs 02/2020 severe airway obstruction, ratio 48, FEV1 0.93/52%, no bronchodilator response, DLCO 63%/11.44  Review of Systems Patient denies significant ,cough, hemoptysis,  chest pain, palpitations, pedal edema, orthopnea, paroxysmal nocturnal dyspnea, lightheadedness, nausea, vomiting, abdominal or  leg pains      Objective:   Physical Exam  Gen. Pleasant, well-nourished, in no distress ENT - no thrush, no pallor/icterus,no post nasal drip Neck: No JVD, no thyromegaly, no carotid bruits Lungs: no use of accessory muscles, no dullness to percussion, decreased BL without rales or rhonchi  Cardiovascular: Rhythm regular, heart sounds  normal, no murmurs or gallops, no peripheral edema Musculoskeletal: No deformities, no cyanosis or clubbing        Assessment & Plan:

## 2020-02-27 NOTE — Progress Notes (Signed)
Full PFT performed today. °

## 2020-02-27 NOTE — Assessment & Plan Note (Signed)
Lung function is stable. We discussed step up to Trelegy but Memory Dance has worked well for her so she will continue on this. We discussed generic options like Symbicort but co-pay is low on Breo so she will continue

## 2020-02-27 NOTE — Patient Instructions (Signed)
  Refills on Breo Lung function is stable

## 2020-03-13 DIAGNOSIS — M65872 Other synovitis and tenosynovitis, left ankle and foot: Secondary | ICD-10-CM | POA: Diagnosis not present

## 2020-03-13 DIAGNOSIS — M25572 Pain in left ankle and joints of left foot: Secondary | ICD-10-CM | POA: Diagnosis not present

## 2020-03-13 DIAGNOSIS — M2042 Other hammer toe(s) (acquired), left foot: Secondary | ICD-10-CM | POA: Diagnosis not present

## 2020-03-21 ENCOUNTER — Telehealth: Payer: Self-pay

## 2020-03-21 ENCOUNTER — Other Ambulatory Visit: Payer: Self-pay

## 2020-03-21 ENCOUNTER — Ambulatory Visit (INDEPENDENT_AMBULATORY_CARE_PROVIDER_SITE_OTHER): Payer: Medicare Other | Admitting: Internal Medicine

## 2020-03-21 VITALS — BP 160/83 | HR 80 | Temp 97.8°F | Resp 12 | Ht 63.0 in | Wt 122.6 lb

## 2020-03-21 DIAGNOSIS — D518 Other vitamin B12 deficiency anemias: Secondary | ICD-10-CM

## 2020-03-21 DIAGNOSIS — M810 Age-related osteoporosis without current pathological fracture: Secondary | ICD-10-CM

## 2020-03-21 DIAGNOSIS — E785 Hyperlipidemia, unspecified: Secondary | ICD-10-CM | POA: Diagnosis not present

## 2020-03-21 DIAGNOSIS — Z79899 Other long term (current) drug therapy: Secondary | ICD-10-CM | POA: Diagnosis not present

## 2020-03-21 DIAGNOSIS — G4700 Insomnia, unspecified: Secondary | ICD-10-CM | POA: Diagnosis not present

## 2020-03-21 LAB — LIPID PANEL
Cholesterol: 241 mg/dL — ABNORMAL HIGH (ref 0–200)
HDL: 82.8 mg/dL (ref >39.00)
LDL Cholesterol: 136 mg/dL — ABNORMAL HIGH (ref 0–99)
NonHDL: 158.12
Total CHOL/HDL Ratio: 3
Triglycerides: 109 mg/dL (ref 0.0–149.0)
VLDL: 21.8 mg/dL (ref 0.0–40.0)

## 2020-03-21 LAB — VITAMIN B12: Vitamin B-12: 1143 pg/mL — ABNORMAL HIGH (ref 211–911)

## 2020-03-21 NOTE — Telephone Encounter (Signed)
-----   Message from Colon Branch, MD sent at 03/21/2020 11:19 AM EDT ----- Regarding: needs reclast, could you set up?

## 2020-03-21 NOTE — Patient Instructions (Signed)
Check the  blood pressure regularly.  BP GOAL is between 110/65 and  135/85. If it is consistently higher or lower, let me know    GO TO THE LAB : Get the blood work     GO TO THE FRONT DESK, PLEASE SCHEDULE YOUR APPOINTMENTS Come back for a checkup in 6 months 

## 2020-03-21 NOTE — Progress Notes (Signed)
Subjective:    Patient ID: Mary Jordan, female    DOB: 1935/07/27, 85 y.o.   MRN: [id number]  DOS:  03/21/2020 Type of visit - description: Follow-up.  Since the last office visit she is doing well. No headaches Normal ambulatory BPs Recently had a dental work, on antibiotics. Also having steroids due to toe irritation.  BP Readings from Last 3 Encounters:  03/21/20 (!) 160/83  02/27/20 120/82  09/23/19 (!) 151/93     Review of Systems See above   Past Medical History:  Diagnosis Date   Allergic rhinitis    Anemia    B12 deficiency    COPD (chronic obstructive pulmonary disease) ([place])    Hyperlipidemia    Hypertension    Insomnia    Laryngopharyngeal reflux (LPR)    Lung mass    s/p excision of RUL benign 2007   Menopause    Osteoarthritis    Osteoporosis     Past Surgical History:  Procedure Laterality Date   ABDOMINAL HYSTERECTOMY     BREAST BIOPSY     remotely, neg   BREAST EXCISIONAL BIOPSY Left    CARDIOVASCULAR STRESS TEST  2007   Cardiolite negative   CATARACT EXTRACTION, BILATERAL     lesion from lung excised, benign  05-2006    Allergies as of 03/21/2020      Reactions   Levofloxacin Hives      Medication List       Accurate as of March 21, 2020 11:17 AM. If you have any questions, ask your nurse or doctor.        amLODipine 5 MG tablet Commonly known as: NORVASC Take 1 tablet (5 mg total) by mouth daily.   amoxicillin 500 MG capsule Commonly known as: AMOXIL Take 500 mg by mouth every 6 (six) hours.   Breo Ellipta 200-25 MCG/INH Aepb Generic drug: fluticasone furoate-vilanterol Inhale 1 puff into the lungs daily.   clindamycin 150 MG capsule Commonly known as: CLEOCIN Take 300 mg by mouth 3 (three) times daily.   clorazepate 3.75 MG tablet Commonly known as: TRANXENE TAKE ONE TABLET BY MOUTH AT BEDTIME AS NEEDED FOR SLEEP   predniSONE 5 MG tablet Commonly known as: DELTASONE Take by mouth.    simvastatin 10 MG tablet Commonly known as: ZOCOR Take 1 tablet (10 mg total) by mouth at bedtime.   Ventolin HFA 108 (90 Base) MCG/ACT inhaler Generic drug: albuterol INHALE 1 TO 2 PUFFS INTO THE LUNGS EVERY6 HOURS AS NEEDED FOR WHEEZING          Objective:   Physical Exam BP (!) 160/83 (BP Location: Right Arm, Patient Position: Sitting, Cuff Size: Small)    Pulse 80    Temp 97.8 F (36.6 C) (Oral)    Resp 12    Ht 5\' 3"  (1.6 m)    Wt 122 lb 9.6 oz (55.6 kg)    SpO2 96%    BMI 21.72 kg/m   General:   Well developed, NAD, BMI noted. HEENT:  Normocephalic . Face symmetric, atraumatic Lungs:  CTA B Normal respiratory effort, no intercostal retractions, no accessory muscle use. Heart: RRR,  no murmur.  Lower extremities: no pretibial edema bilaterally  Skin: Not pale. Not jaundice Neurologic:  alert & oriented X3.  Speech normal, gait appropriate for age and unassisted Psych--  Cognition and judgment appear intact.  Cooperative with normal attention span and concentration.  Behavior appropriate. No anxious or depressed appearing.      Assessment  Assessment HTN dx 2021 Hyperlipidemia Insomnia: Chronic, mild COPD B12 deficiency  Osteoporosis : h/o wrist Fx, T score -2.2  (2013), -2.1 (03-2015) Intolerant to fosamax, actonel $$, took reclast before ~2013 DJD Menopausal Lung mass RUL, s/p excision benign 2007  PLAN HTN: Currently on amlodipine, BP initially elevated when she arrived to the office, I rechecked in both arms: 130/80.  No change. Hyperlipidemia: On simvastatin, last LFTs normal, check FLP. Insomnia: On Tranxene, check UDS B12 deficiency: On oral supplements, checking levels Headache: See last visit, CT abnormal,  went to the ER >>  MRI brain nonacute. sxs largely resolved  Osteoporosis: 01/2018: T score -2.3.  Previously declined Reclast due to Covid pandemia.  She is now ready to proceed. Will  Arrange, on vitamin D supplements. Preventive  care: Had COVID shots, recommend flu shot this fall. RTC 6 months   This visit occurred during the SARS-CoV-2 public health emergency.  Safety protocols were in place, including screening questions prior to the visit, additional usage of staff PPE, and extensive cleaning of exam room while observing appropriate contact time as indicated for disinfecting solutions.

## 2020-03-22 DIAGNOSIS — M71572 Other bursitis, not elsewhere classified, left ankle and foot: Secondary | ICD-10-CM | POA: Diagnosis not present

## 2020-03-22 NOTE — Assessment & Plan Note (Addendum)
HTN: Currently on amlodipine, BP initially elevated when she arrived to the office, I rechecked in both arms: 130/80.  No change. Hyperlipidemia: On simvastatin, last LFTs normal, check FLP. Insomnia: On Tranxene, check UDS B12 deficiency: On oral supplements, checking levels Headache: See last visit, CT abnormal,  went to the ER >>  MRI brain nonacute. sxs largely resolved  Osteoporosis: 01/2018: T score -2.3.  Previously declined Reclast due to Covid pandemia.  She is now ready to proceed. Will  Arrange, on vitamin D supplements. Preventive care: Had COVID shots, recommend flu shot this fall. RTC 6 months

## 2020-03-22 NOTE — Telephone Encounter (Signed)
Called late on Thursday to get help to get this set up but they were closed.  Mary Jordan can you call on Monday.  Sorry I misplaced my instructions (the only thing I had was the form)

## 2020-03-23 LAB — DRUG MONITORING, PANEL 8 WITH CONFIRMATION, URINE
6 Acetylmorphine: NEGATIVE ng/mL (ref <10)
Alcohol Metabolites: NEGATIVE ng/mL
Alphahydroxyalprazolam: NEGATIVE ng/mL (ref <25)
Alphahydroxymidazolam: NEGATIVE ng/mL (ref <50)
Alphahydroxytriazolam: NEGATIVE ng/mL (ref <50)
Aminoclonazepam: NEGATIVE ng/mL (ref <25)
Amphetamines: NEGATIVE ng/mL (ref <500)
Benzodiazepines: POSITIVE ng/mL — AB (ref <100)
Buprenorphine, Urine: NEGATIVE ng/mL (ref <5)
Cocaine Metabolite: NEGATIVE ng/mL (ref <150)
Creatinine: 22.5 mg/dL
Hydroxyethylflurazepam: NEGATIVE ng/mL (ref <50)
Lorazepam: NEGATIVE ng/mL (ref <50)
MDMA: NEGATIVE ng/mL (ref <500)
Marijuana Metabolite: NEGATIVE ng/mL (ref <20)
Nordiazepam: NEGATIVE ng/mL (ref <50)
Opiates: NEGATIVE ng/mL (ref <100)
Oxazepam: 89 ng/mL — ABNORMAL HIGH (ref <50)
Oxidant: NEGATIVE ug/mL
Oxycodone: NEGATIVE ng/mL (ref <100)
Temazepam: NEGATIVE ng/mL (ref <50)
pH: 5.4 (ref 4.5–9.0)

## 2020-03-23 LAB — DM TEMPLATE

## 2020-03-26 NOTE — Telephone Encounter (Signed)
Mitzo please inform Pt she will need to come back for BMP (mainly kidney function)- this has to be done within 30 days to have Reclast scheduled and this was not drawn when she was here on 03/21/20. Once blood work is back I'll be able to schedule it.

## 2020-03-27 NOTE — Telephone Encounter (Signed)
Please don't forget to add the order. Appointment has been scheduled.

## 2020-03-27 NOTE — Telephone Encounter (Signed)
BMP ordered. (Sorry I thought I did).

## 2020-03-27 NOTE — Addendum Note (Signed)
Addended byDamita Dunnings D on: 03/27/2020 11:32 AM   Modules accepted: Orders

## 2020-03-29 MED ORDER — ATORVASTATIN CALCIUM 20 MG PO TABS: 20.0000 mg | ORAL_TABLET | Freq: Every day | ORAL | 2 refills | Status: DC

## 2020-03-29 NOTE — Addendum Note (Signed)
Addended byDamita Dunnings D on: 03/29/2020 04:10 PM   Modules accepted: Orders

## 2020-04-10 DIAGNOSIS — Z23 Encounter for immunization: Secondary | ICD-10-CM | POA: Diagnosis not present

## 2020-04-11 NOTE — Addendum Note (Signed)
Addended by: Kelle Darting A on: 04/11/2020 01:33 PM   Modules accepted: Orders

## 2020-04-12 ENCOUNTER — Other Ambulatory Visit: Payer: Medicare Other

## 2020-04-12 ENCOUNTER — Other Ambulatory Visit: Payer: Self-pay

## 2020-04-12 DIAGNOSIS — M810 Age-related osteoporosis without current pathological fracture: Secondary | ICD-10-CM

## 2020-04-13 LAB — BASIC METABOLIC PANEL
BUN: 11 mg/dL (ref 7–25)
CO2: 28 mmol/L (ref 20–32)
Calcium: 9.3 mg/dL (ref 8.6–10.4)
Chloride: 106 mmol/L (ref 98–110)
Creat: 0.62 mg/dL (ref 0.60–0.88)
Glucose, Bld: 85 mg/dL (ref 65–99)
Potassium: 4.2 mmol/L (ref 3.5–5.3)
Sodium: 140 mmol/L (ref 135–146)

## 2020-04-17 NOTE — Telephone Encounter (Signed)
Reclast orders faxed to [facility] Short Stay.

## 2020-04-17 NOTE — Telephone Encounter (Signed)
LMOM informing for [facility] to return call to set up Reclast appt.

## 2020-04-18 NOTE — Telephone Encounter (Signed)
Reclast scheduled 04/26/20 at 10am. Pt aware.

## 2020-04-26 ENCOUNTER — Ambulatory Visit (HOSPITAL_COMMUNITY): Payer: Medicare Other

## 2020-05-06 DIAGNOSIS — Z23 Encounter for immunization: Secondary | ICD-10-CM | POA: Diagnosis not present

## 2020-05-22 ENCOUNTER — Telehealth: Payer: Self-pay | Admitting: Pharmacist

## 2020-05-22 NOTE — Progress Notes (Addendum)
**Note Mary-Identified via Obfuscation** Chronic Care Management Pharmacy Assistant   Name: Mary Jordan  MRN: [id number] DOB: 1934-11-11  Reason for Encounter: Disease State  Patient Questions:  1.  Have you seen any other providers since your last visit? No  2.  Any changes in your medicines or health? No   PCP : Mary Branch, MD   Their chronic conditions include: Hypertension, Hyperlipidemia, COPD, Insomnia  Office Visit: 03-21-20(PCP) Patient presented in office with [name] for a follow up.  No medication changes.  Consult: 02-27-20(Pulmonary) Patient presented in office with Mary Jordan for a follow up.  No medication changes   No Hospitalizations since last office visit on 02-22-20.  Allergies:   Allergies  Allergen Reactions  . Levofloxacin Hives    Medications: Outpatient Encounter Medications as of 05/22/2020  Medication Sig  . amLODipine (NORVASC) 5 MG tablet Take 1 tablet (5 mg total) by mouth daily.  Marland Kitchen amoxicillin (AMOXIL) 500 MG capsule Take 500 mg by mouth every 6 (six) hours.  Marland Kitchen atorvastatin (LIPITOR) 20 MG tablet Take 1 tablet (20 mg total) by mouth at bedtime.  . clindamycin (CLEOCIN) 150 MG capsule Take 300 mg by mouth 3 (three) times daily.  . clorazepate (TRANXENE) 3.75 MG tablet TAKE ONE TABLET BY MOUTH AT BEDTIME AS NEEDED FOR SLEEP  . fluticasone furoate-vilanterol (BREO ELLIPTA) 200-25 MCG/INH AEPB Inhale 1 puff into the lungs daily.  . predniSONE (DELTASONE) 5 MG tablet Take by mouth.  . simvastatin (ZOCOR) 10 MG tablet Take 1 tablet (10 mg total) by mouth at bedtime.  . VENTOLIN HFA 108 (90 Base) MCG/ACT inhaler INHALE 1 TO 2 PUFFS INTO THE LUNGS EVERY6 HOURS AS NEEDED FOR WHEEZING   No facility-administered encounter medications on file as of 05/22/2020.    Current Diagnosis: Patient Active Problem List   Diagnosis Date Noted  . Laryngopharyngeal reflux (LPR) 11/26/2016  . PCP NOTES >>>> 06/30/2015  . COPD (chronic obstructive pulmonary disease) ([place]) 05/31/2015  .  Hypertension 11/16/2013  . Annual physical exam 03/26/2011  . CERVICAL RADICULOPATHY, LEFT 10/14/2010  . COPD exacerbation ([place]) 09/05/2010  . Allergic rhinitis 05/29/2010  . ANEMIA, B12 DEFICIENCY 11/05/2007  . Dyslipidemia 10/06/2007  . DJD (degenerative joint disease) 10/06/2007  . Osteoporosis 06/07/2007  . INSOMNIA, CHRONIC, MILD 04/05/2007    Goals Addressed   None    Reviewed chart prior to disease state call. Spoke with patient regarding BP  Recent Office Vitals: BP Readings from Last 3 Encounters:  03/21/20 (!) 160/83  02/27/20 120/82  09/23/19 (!) 151/93   Pulse Readings from Last 3 Encounters:  03/21/20 80  02/27/20 94  09/23/19 (!) 116    Wt Readings from Last 3 Encounters:  03/21/20 122 lb 9.6 oz (55.6 kg)  02/27/20 124 lb (56.2 kg)  09/23/19 128 lb 8 oz (58.3 kg)     Kidney Function Lab Results  Component Value Date/Time   CREATININE 0.62 04/12/2020 08:06 AM   CREATININE 0.61 09/23/2019 02:43 PM   CREATININE 0.60 07/19/2019 10:11 AM   CREATININE 0.70 03/20/2016 11:08 AM   GFR 95.65 07/19/2019 10:11 AM   GFRNONAA >60 09/23/2019 02:43 PM   GFRAA >60 09/23/2019 02:43 PM    BMP Latest Ref Rng & Units 04/12/2020 09/23/2019 07/19/2019  Glucose 65 - 99 mg/dL 85 102(H) 95  BUN 7 - 25 mg/dL 11 8 9   Creatinine 0.60 - 0.88 mg/dL 0.62 0.61 0.60  BUN/Creat Ratio 6 - 22 (calc) NOT APPLICABLE - -  Sodium 751 -  146 mmol/L 140 142 139  Potassium 3.5 - 5.3 mmol/L 4.2 3.6 4.1  Chloride 98 - 110 mmol/L 106 106 104  CO2 20 - 32 mmol/L 28 25 30   Calcium 8.6 - 10.4 mg/dL 9.3 9.6 9.3    . Current antihypertensive regimen:  ? Amlodipine 5mg  daily  . How often are you checking your Blood Pressure? several times per month . Current home BP readings: 129/83 on 10-7 . What recent interventions/DTPs have been made by any provider to improve Blood Pressure control since last CPP Visit: None . Any recent hospitalizations or ED visits since last visit with CPP? No . What  diet changes have been made to improve Blood Pressure Control?  o Patient states she feels as if she is eating more but does watch her salt intake. . What exercise is being done to improve your Blood Pressure Control?  o [facility] [place] [facility] [place].   Adherence Review: Is the patient currently on ACE/ARB medication? No Does the patient have >5 day gap between last estimated fill dates? No CPP Please Review  Follow-Up:  Pharmacist Review   Mary Jordan, [facility] [place] Pharmacist Assistant [phone]  Reviewed by: Mary Jordan, PharmD Clinical Pharmacist [facility] [phone]

## 2020-06-13 ENCOUNTER — Telehealth: Payer: Self-pay | Admitting: Internal Medicine

## 2020-06-13 NOTE — Telephone Encounter (Signed)
Requesting: clorazepate Contract: 12/11/2017 UDS: 03/21/2020 Last Visit: 03/21/2020 Next Visit: 09/21/2020 Last Refill: 11/16/2019 #30 and 3RF Pt sig: 1 tab qhs prn  Please Advise

## 2020-06-13 NOTE — Telephone Encounter (Signed)
PDMP okay, prescription sent 

## 2020-06-26 ENCOUNTER — Other Ambulatory Visit: Payer: Self-pay | Admitting: Primary Care

## 2020-07-02 DIAGNOSIS — M71572 Other bursitis, not elsewhere classified, left ankle and foot: Secondary | ICD-10-CM | POA: Diagnosis not present

## 2020-07-19 DIAGNOSIS — M71572 Other bursitis, not elsewhere classified, left ankle and foot: Secondary | ICD-10-CM | POA: Diagnosis not present

## 2020-07-25 ENCOUNTER — Ambulatory Visit: Payer: Medicare Other | Admitting: Pharmacist

## 2020-07-25 DIAGNOSIS — E785 Hyperlipidemia, unspecified: Secondary | ICD-10-CM

## 2020-07-25 DIAGNOSIS — G4700 Insomnia, unspecified: Secondary | ICD-10-CM

## 2020-07-25 DIAGNOSIS — I10 Essential (primary) hypertension: Secondary | ICD-10-CM

## 2020-07-25 NOTE — Chronic Care Management (AMB) (Signed)
Chronic Care Management Pharmacy  Name: Mary Jordan  MRN: [id number] DOB: 1934/11/22   Chief Complaint/ HPI  Mary Jordan,  85 y.o. , female presents for their Follow-Up CCM visit with the clinical pharmacist via telephone due to COVID-19 Pandemic.  PCP : Colon Branch, MD  Their chronic conditions include: Hypertension, Hyperlipidemia, COPD, Insomnia  Office Visits: 03/21/20: Message that Reclast scheduled for 04/26/20 at 10am  03/21/20: Visit w/ Dr. Larose Kells - Stop simvastatin and start atorvastatin 21m daily. Pt ready to start reclast.   Consult Visit: 03/22/20: Podiatry visit w/ Dr. PBarkley Bruns 02/27/20: Pulmonology visit w/ Dr. AElsworth Soho- Lung function stable. Discussed step up to Trelegy, but BMemory Dancehas worked well for patient, so decision to continue with Breo.   Medications: Outpatient Encounter Medications as of 07/25/2020  Medication Sig Note  . amLODipine (NORVASC) 5 MG tablet Take 1 tablet (5 mg total) by mouth daily.   .Marland Kitchenamoxicillin (AMOXIL) 500 MG capsule Take 500 mg by mouth every 6 (six) hours.   .Marland Kitchenatorvastatin (LIPITOR) 20 MG tablet Take 1 tablet (20 mg total) by mouth at bedtime. (Patient not taking: Reported on 07/25/2020) 07/25/2020: Never switched from simvastatin  . clindamycin (CLEOCIN) 150 MG capsule Take 300 mg by mouth 3 (three) times daily.   . clorazepate (TRANXENE) 3.75 MG tablet TAKE ONE TABLET BY MOUTH AT BEDTIME AS NEEDED FOR SLEEP 07/26/2020: Called insurance to discuss medication moving to Tier 4. Brief Summary: Cost will be $42.64 until she meets her $400 deductible, then it goes down to about $21 per 30DS  . fluticasone (FLONASE) 50 MCG/ACT nasal spray TWO SPRAYS EACH NOSTRIL DAILY   . fluticasone furoate-vilanterol (BREO ELLIPTA) 200-25 MCG/INH AEPB Inhale 1 puff into the lungs daily.   . predniSONE (DELTASONE) 5 MG tablet Take by mouth.   . simvastatin (ZOCOR) 10 MG tablet Take 1 tablet (10 mg total) by mouth at bedtime. 07/25/2020: Only taking 1/2 tab  due to leg pain  . VENTOLIN HFA 108 (90 Base) MCG/ACT inhaler INHALE 1 TO 2 PUFFS INTO THE LUNGS EVERY6 HOURS AS NEEDED FOR WHEEZING    No facility-administered encounter medications on file as of 07/25/2020.   SDOH Screenings   Alcohol Screen: Not on file  Depression ((AGT3-6: Not on file  Financial Resource Strain: Medium Risk  . Difficulty of Paying Living Expenses: Somewhat hard  Food Insecurity: Not on file  Housing: Not on file  Physical Activity: Not on file  Social Connections: Not on file  Stress: Not on file  Tobacco Use: Medium Risk  . Smoking Tobacco Use: Former Smoker  . Smokeless Tobacco Use: Never Used  Transportation Needs: Not on file     Current Diagnosis/Assessment:  Goals Addressed            This Visit's Progress   . Chronic Care Management Pharmacy Care Plan       CARE PLAN ENTRY (see longitudinal plan of care for additional care plan information)  Current Barriers:  . Chronic Disease Management support, education, and care coordination needs related to Hypertension, Hyperlipidemia, COPD, Insomnia   Hypertension BP Readings from Last 3 Encounters:  03/21/20 (!) 160/83  02/27/20 120/82  09/23/19 (!) 151/93   . Pharmacist Clinical Goal(s): o Over the next 90 days, patient will work with PharmD and providers to maintain BP goal <140/90 . Current regimen:  o Amlodipine 525mdaily  . Interventions: o Discussed BP goal . Patient self care activities - Over the next  90 days, patient will: o Check BP 1-2 times per week, document, and provide at future appointments o Ensure daily salt intake < 2300 mg/Mary Jordan o Maintain hypertension medication regimen.   Hyperlipidemia Lab Results  Component Value Date/Time   LDLCALC 136 (H) 03/21/2020 11:19 AM   LDLDIRECT 186.2 08/02/2012 09:25 AM   . Pharmacist Clinical Goal(s): o Over the next 90 days, patient will work with PharmD and providers to achieve LDL goal < 100 . Current regimen:  o Simvastatin 37m  daily (changed to atorvastatin, but patient did not make switch) o Collaboration with provider regarding medication management (recommendation to switch to rosuvastatin vs atorvastatin considering muscle pain) . Interventions: o Discussed LDL goal  . Patient self care activities - Over the next 90 days, patient will: o Maintain cholesterol medication regimen.   Insomnia . Pharmacist Clinical Goal(s) o Over the next 90 days, patient will work with PharmD and providers to reduce symptoms associated with insomnia . Current regimen:  o Clorazepate 3.733mdaily at bedtime as needed . Interventions: o Collaboration with health plan regarding medication benefit (discussed implication of medication moving to Tier 4) . Patient self care activities - Over the next 90 days, patient will: o Maintain insomnia medication regimen  Medication management . Pharmacist Clinical Goal(s): o Over the next 90 days, patient will work with PharmD and providers to maintain optimal medication adherence . Current pharmacy: [facility] . Interventions o Comprehensive medication review performed. o Continue current medication management strategy . Patient self care activities - Over the next 90 days, patient will: o Focus on medication adherence by filling and taking medications appropriately  o Take medications as prescribed o Report any questions or concerns to PharmD and/or provider(s)  Please see past updates related to this goal by clicking on the "Past Updates" button in the selected goal        Social Hx:  Lives alone. No pets.  2 Children (one in [place], and one 3 miles away) 4 Grandchildren 1 Great grandchild (MRetail banker Insomnia    Patient has failed these meds in past: None noted  Patient is currently controlled on the following medications: . Clorazepate 3.7563maily at bedtime as needed  Patient informed me that she received a letter from her insurance company stating clorazepate is  now on a higher tier and patient's copay would increase. The letter recommended for provider to request tier exception or prescribe lower tier alternative.  Per patient they provided the following alternatives:  Diazepam 5mg52mrazepam 1mg 80mdate 07/26/20 Called [employer] and spoke to JeannSweet Waterinformed me that the contracted price with [facility] medication is $42.64.  On August 11, 2020, the patient would be responsible for this cost until her $400 deductible is met. After deductible is met, the patient is responsible for 48% of the cost of the medication for Tier 4 drugs versus a flat copay of $37 for Tier 3 medications.  Fortunately for this patient, the medication moving to Tier 4 should work in her favor, because she will now be responsible for 48% of $42.64 which is $20.47 per 30DS (a price cheaper than her current price of $37/30DS).   Considering this will continue patient on current regimen and encouraged her to reach out if this happens to not be the case upon her arrival to the pharmacy.   Plan -Continue current medications   Hypertension   Blood pressure goal <140/90  Office blood pressures are  BP Readings from  Last 3 Encounters:  03/21/20 (!) 160/83  02/27/20 120/82  09/23/19 (!) 151/93    Patient has failed these meds in the past: None noted  Patient is currently controlled per most recent home BP on the following medications:   Amlodipine 36m daily AM   Patient checks BP at home weekly  Patient home BP readings are ranging: 02/16/20 133/75 pulse 93  We discussed Blood pressure goal   Update 07/25/20 123 86 124 77 124 86 120 82 Avg 122.7 82.7  Plan -Check BP 1-2 times per week and record -Continue current medications   Hyperlipidemia   LDL goal <100  Lipid Panel     Component Value Date/Time   CHOL 241 (H) 03/21/2020 1119   TRIG 109.0 03/21/2020 1119   HDL 82.80 03/21/2020 1119   LDLCALC 136 (H) 03/21/2020 1119   LDLDIRECT 186.2  08/02/2012 0925    Hepatic Function Latest Ref Rng & Units 09/23/2019 07/19/2019 12/11/2017  Total Protein 6.5 - 8.1 g/dL 7.5 6.6 6.8  Albumin 3.5 - 5.0 g/dL 4.2 4.3 4.1  AST 15 - 41 U/L '20 15 20  ' ALT 0 - 44 U/L '19 13 15  ' Alk Phosphatase 38 - 126 U/L 56 61 59  Total Bilirubin 0.3 - 1.2 mg/dL 0.6 0.4 0.4     The ASCVD Risk score (Mary BussingDC Jr., et al., 2013) failed to calculate for the following reasons:   The 2013 ASCVD risk score is only valid for ages 474to 733  Patient has failed these meds in past: None noted  Patient is currently uncontrolled on the following medications:  . Simvastatin 171mdaily (patient taking 1/2 tab daily) . Atorvastatin 2074maily (changed at 03/21/20 visit w/ Dr. PazLarose KellsWe discussed:  LDL goal. Noting pt's age agreeable with continuing lifestyle modifications vs increasing statin dose  Update 07/25/20 Switch to atorvastatin? No, still taking simvastatin 70m83mmits she is not taking a whole tablet, she is taking 1/2 tab for "quite a while" due to muscle pain  Last filled 05/23/20 for 90 DS with directions to take #1 daily  Coordinate with Dr. Paz Larose Kellsarding if pt should continue with atorvastatin or consider rosuvastatin  Plan -Continue current medications   COPD/Tobacco   Last spirometry score: 4/20/20217  FVC 2.4 (90%) FEV1 1.1 (54%) FEV1/FVC (59%)  PFTs 02/2020 severe airway obstruction, ratio 48, FEV1 0.93/52%, no bronchodilator response, DLCO 63%/11.44  Eosinophil count:   Lab Results  Component Value Date/Time   EOSPCT 1 09/23/2019 02:43 PM  %                               Eos (Absolute):  Lab Results  Component Value Date/Time   EOSABS 0.1 09/23/2019 02:43 PM    Tobacco Status:  Social History   Tobacco Use  Smoking Status Former Smoker  . Packs/Rollyn Scialdone: 1.00  . Years: 39.00  . Pack years: 39.00  . Types: Cigarettes  . Quit date: 08/11/1996  . Years since quitting: 23.9  Smokeless Tobacco Never Used  Tobacco Comment         Patient has failed these meds in past: None noted  Patient is currently controlled on the following medications:   Breo Ellipta 200-25mg21muff daily  Ventolin HFA 1-2 puffs every 6 hours as needed Using maintenance inhaler regularly? Yes Frequency of rescue inhaler use:  infrequently (uses more during allergy season)  We discussed: pt scheduled to  get spirometry on 02/27/20 and follow up appt with pulm on same date  Update 07/25/20 Only SOB with exertion  Plan -Continue current medications  Vaccines   Reviewed and discussed patient's vaccination history.  Patient is up to date on vaccines.   Immunization History  Administered Date(s) Administered  . Influenza Split 05/12/2011, 04/15/2012  . Influenza Whole 06/07/2007, 04/28/2008, 05/25/2009, 06/28/2010  . Influenza, High Dose Seasonal PF 06/12/2016, 04/18/2017, 03/25/2019  . Influenza,inj,Quad PF,6+ Mos 05/30/2013, 04/20/2014, 05/31/2015  . Influenza-Unspecified 04/11/2017, 05/11/2018, 04/10/2020  . PFIZER SARS-COV-2 Vaccination 09/19/2019, 10/10/2019, 05/06/2020  . Pneumococcal Conjugate-13 04/20/2014  . Pneumococcal Polysaccharide-23 04/05/2007, 04/15/2012  . Td 04/05/2007  . Tdap 08/04/2016  . Zoster 11/05/2007  . Zoster Recombinat (Shingrix) 01/14/2018, 05/05/2018   Covid vaccine booster? Yes 05/06/20 per [facility] (Updated [facility] record and closed care gap) Flu vaccine? Yes 04/10/20 per [facility] personnel (Updated [facility] record and closed care gap)   Melvenia Beam Kensey Luepke, PharmD, [facility] Clinical Pharmacist [facility] at [facility] [phone]

## 2020-07-26 NOTE — Patient Instructions (Addendum)
Visit Information  Goals Addressed            This Visit's Progress   . Chronic Care Management Pharmacy Care Plan       CARE PLAN ENTRY (see longitudinal plan of care for additional care plan information)  Current Barriers:  . Chronic Disease Management support, education, and care coordination needs related to Hypertension, Hyperlipidemia, COPD, Insomnia   Hypertension BP Readings from Last 3 Encounters:  03/21/20 (!) 160/83  02/27/20 120/82  09/23/19 (!) 151/93   . Pharmacist Clinical Goal(s): o Over the next 90 days, patient will work with PharmD and providers to maintain BP goal <140/90 . Current regimen:  o Amlodipine 5mg  daily  . Interventions: o Discussed BP goal . Patient self care activities - Over the next 90 days, patient will: o Check BP 1-2 times per week, document, and provide at future appointments o Ensure daily salt intake < 2300 mg/Kindel Rochefort o Maintain hypertension medication regimen.   Hyperlipidemia Lab Results  Component Value Date/Time   LDLCALC 136 (H) 03/21/2020 11:19 AM   LDLDIRECT 186.2 08/02/2012 09:25 AM   . Pharmacist Clinical Goal(s): o Over the next 90 days, patient will work with PharmD and providers to achieve LDL goal < 100 . Current regimen:  o Simvastatin 10mg  daily (changed to atorvastatin, but patient did not make switch) o Collaboration with provider regarding medication management (recommendation to switch to rosuvastatin vs atorvastatin considering muscle pain) . Interventions: o Discussed LDL goal  . Patient self care activities - Over the next 90 days, patient will: o Maintain cholesterol medication regimen.   Insomnia . Pharmacist Clinical Goal(s) o Over the next 90 days, patient will work with PharmD and providers to reduce symptoms associated with insomnia . Current regimen:  o Clorazepate 3.75mg  daily at bedtime as needed . Interventions: o Collaboration with health plan regarding medication benefit (discussed implication  of medication moving to Tier 4) . Patient self care activities - Over the next 90 days, patient will: o Maintain insomnia medication regimen  Medication management . Pharmacist Clinical Goal(s): o Over the next 90 days, patient will work with PharmD and providers to maintain optimal medication adherence . Current pharmacy: [facility] . Interventions o Comprehensive medication review performed. o Continue current medication management strategy . Patient self care activities - Over the next 90 days, patient will: o Focus on medication adherence by filling and taking medications appropriately  o Take medications as prescribed o Report any questions or concerns to PharmD and/or provider(s)  Please see past updates related to this goal by clicking on the "Past Updates" button in the selected goal         Mary Jordan was given information about Chronic Care Management services today including:  1. CCM service includes personalized support from designated clinical staff supervised by her physician, including individualized plan of care and coordination with other care providers 2. 24/7 contact phone numbers for assistance for urgent and routine care needs. 3. Standard insurance, coinsurance, copays and deductibles apply for chronic care management only during months in which we provide at least 20 minutes of these services. Most insurances cover these services at 100%, however patients may be responsible for any copay, coinsurance and/or deductible if applicable. This service may help you avoid the need for more expensive face-to-face services. 4. Only one practitioner may furnish and bill the service in a calendar month. 5. The patient may stop CCM services at any time (effective at the end of the  month) by phone call to the office staff.  Patient agreed to services and verbal consent obtained.   The patient verbalized understanding of instructions, educational materials, and care plan  provided today and agreed to receive a mailed copy of patient instructions, educational materials, and care plan.  Telephone follow up appointment with pharmacy team member scheduled for: 10/23/2020  Mary Jordan, [facility]

## 2020-08-20 ENCOUNTER — Ambulatory Visit (INDEPENDENT_AMBULATORY_CARE_PROVIDER_SITE_OTHER): Payer: Medicare Other | Admitting: Family Medicine

## 2020-08-20 ENCOUNTER — Other Ambulatory Visit: Payer: Self-pay

## 2020-08-20 ENCOUNTER — Ambulatory Visit: Payer: Self-pay

## 2020-08-20 VITALS — BP 122/72 | Ht 63.5 in | Wt 122.0 lb

## 2020-08-20 DIAGNOSIS — M25562 Pain in left knee: Secondary | ICD-10-CM

## 2020-08-20 DIAGNOSIS — M25852 Other specified joint disorders, left hip: Secondary | ICD-10-CM | POA: Insufficient documentation

## 2020-08-20 DIAGNOSIS — M25552 Pain in left hip: Secondary | ICD-10-CM | POA: Diagnosis not present

## 2020-08-20 MED ORDER — METHYLPREDNISOLONE ACETATE 40 MG/ML IJ SUSP
40.0000 mg | Freq: Once | INTRAMUSCULAR | Status: AC
  Administered 2020-08-20: 40 mg via INTRA_ARTICULAR

## 2020-08-20 NOTE — Assessment & Plan Note (Signed)
See notes consistent with trochanteric changes.  She does have significant weakness on abduction.  No structural changes appreciated at the knee.  May have component of IT band syndrome. -Counseled on home exercise therapy and supportive care. - injection  - Could consider physical therapy

## 2020-08-20 NOTE — Progress Notes (Signed)
Mary Jordan - 86 y.o. female MRN [id number]  Date of birth: [birth year]  SUBJECTIVE:  Including CC & ROS.  No chief complaint on file.   Mary Jordan is a 86 y.o. female that is presenting with left hip and knee pain.  The pain has been ongoing for a few weeks.  The pain is in the lateral hip as well as the lateral knee.  No new or different activities.  No previous history of similar pain.  No improvement with medications thus far.   Review of Systems See HPI   HISTORY: Past Medical, Surgical, Social, and Family History Reviewed & Updated per EMR.   Pertinent Historical Findings include:  Past Medical History:  Diagnosis Date  . Allergic rhinitis   . Anemia   . B12 deficiency   . COPD (chronic obstructive pulmonary disease) ([place])   . Hyperlipidemia   . Hypertension   . Insomnia   . Laryngopharyngeal reflux (LPR)   . Lung mass    s/p excision of RUL benign 2007  . Menopause   . Osteoarthritis   . Osteoporosis     Past Surgical History:  Procedure Laterality Date  . ABDOMINAL HYSTERECTOMY    . BREAST BIOPSY     remotely, neg  . BREAST EXCISIONAL BIOPSY Left   . CARDIOVASCULAR STRESS TEST  2007   Cardiolite negative  . CATARACT EXTRACTION, BILATERAL    . lesion from lung excised, benign  05-2006    Family History  Problem Relation Age of Onset  . Heart attack Father 84  . Dementia Mother   . Breast cancer Mother   . Asthma Mother   . Hyperlipidemia Neg Hx   . Diabetes Neg Hx   . Stroke Neg Hx   . Colon cancer Neg Hx     Social History   Socioeconomic History  . Marital status: Divorced    Spouse name: Not on file  . Number of children: 2  . Years of education: Not on file  . Highest education level: Not on file  Occupational History  . Occupation: Retired    Fish farm manager: RETIRED    CommentChiropodist  Tobacco Use  . Smoking status: Former Smoker    Packs/day: 1.00    Years: 39.00    Pack years: 39.00    Types: Cigarettes    Quit date:  08/11/1996    Years since quitting: 24.0  . Smokeless tobacco: Never Used  . Tobacco comment:    Vaping Use  . Vaping Use: Never used  Substance and Sexual Activity  . Alcohol use: Yes    Alcohol/week: 6.0 standard drinks    Types: 3 Glasses of wine, 3 Cans of beer per week    Comment: three times weekly  . Drug use: No  . Sexual activity: Not on file  Other Topics Concern  . Not on file  Social History Narrative   Lives by herself    Has 2 children, 4 gkids all boys        Social Determinants of Health   Financial Resource Strain: Medium Risk  . Difficulty of Paying Living Expenses: Somewhat hard  Food Insecurity: Not on file  Transportation Needs: Not on file  Physical Activity: Not on file  Stress: Not on file  Social Connections: Not on file  Intimate Partner Violence: Not on file     PHYSICAL EXAM:  VS: BP 122/72   Ht 5' 3.5" (1.613 m)   Wt 122 lb (55.3 kg)  BMI 21.27 kg/m  Physical Exam Gen: NAD, alert, cooperative with exam, well-appearing MSK:  Left hip: Significant weakness with hip abduction. Tenderness palpation of the greater trochanter. Left knee: No effusion. Normal strength resistance. No instability. Neurovascularly intact  Limited ultrasound: Left knee:  No effusion suprapatellar pouch. Normal-appearing quadricep patellar tendon. Normal-appearing medial meniscus and lateral meniscus  Summary: No significant structural changes of the knee.  Ultrasound and interpretation by Clearance Coots, MD   Aspiration/Injection Procedure Note Mary Jordan Jun 13, 1935  Procedure: Injection Indications: Left hip pain  Procedure Details Consent: Risks of procedure as well as the alternatives and risks of each were explained to the (patient/caregiver).  Consent for procedure obtained. Time Out: Verified patient identification, verified procedure, site/side was marked, verified correct patient position, special equipment/implants available,  medications/allergies/relevent history reviewed, required imaging and test results available.  Performed.  The area was cleaned with iodine and alcohol swabs.    The left greater trochanteric bursa was injected using 1 cc's of 40 mg Depo-Medrol and 4 cc's of 0.25% bupivacaine with a 22 1 1/2" needle.  Ultrasound was used. Images were obtained in short views showing the injection.     A sterile dressing was applied.  Patient did tolerate procedure well.    ASSESSMENT & PLAN:   Greater trochanteric pain syndrome of left lower extremity See notes consistent with trochanteric changes.  She does have significant weakness on abduction.  No structural changes appreciated at the knee.  May have component of IT band syndrome. -Counseled on home exercise therapy and supportive care. - injection  - Could consider physical therapy

## 2020-08-20 NOTE — Patient Instructions (Signed)
Nice to meet you Please try heat Please try the exercises   Please send me a message in MyChart with any questions or updates.  Please see me back in 4 weeks.   --Dr. Jermond Burkemper  

## 2020-08-23 ENCOUNTER — Telehealth: Payer: Self-pay | Admitting: Family Medicine

## 2020-08-23 ENCOUNTER — Other Ambulatory Visit: Payer: Self-pay

## 2020-08-23 ENCOUNTER — Ambulatory Visit (INDEPENDENT_AMBULATORY_CARE_PROVIDER_SITE_OTHER): Payer: Medicare Other | Admitting: Family Medicine

## 2020-08-23 ENCOUNTER — Ambulatory Visit: Payer: Self-pay

## 2020-08-23 DIAGNOSIS — M25852 Other specified joint disorders, left hip: Secondary | ICD-10-CM | POA: Diagnosis not present

## 2020-08-23 MED ORDER — TRIAMCINOLONE ACETONIDE 40 MG/ML IJ SUSP
40.0000 mg | Freq: Once | INTRAMUSCULAR | Status: AC
  Administered 2020-08-23: 40 mg via INTRA_ARTICULAR

## 2020-08-23 NOTE — Telephone Encounter (Signed)
Patient called states she thinks the shot didn't take(she is still in significant pain) request provider call her w/suggestions or is another OV needed to try something else --glh

## 2020-08-23 NOTE — Telephone Encounter (Signed)
Left VM for patient. If she calls back please have her speak with a nurse/CMA and inform that if her pain hasn't improve then we can try an injection into the hip joint or look at the back to the source of her pain.   If any questions then please take the best time and phone number to call and I will try to call her back.   Rosemarie Ax, MD [facility] 08/23/2020, 12:05 PM

## 2020-08-23 NOTE — Patient Instructions (Signed)
Good to see you Please try ice as needed  Please try the exercises if the pain has improved.   Please send me a message in MyChart with any questions or updates.  Please see [place] back as scheduled.   --Dr. Raeford Razor

## 2020-08-23 NOTE — Assessment & Plan Note (Addendum)
No improvement with her greater trochanteric injection.  Concerning that this is either coming from the hip joint itself or the SI joint. -Counseled on home exercise therapy and supportive care. -Hip joint injection. -X-ray. -Could consider physical therapy.

## 2020-08-23 NOTE — Progress Notes (Signed)
Mary Jordan - 86 y.o. female MRN [id number]  Date of birth: [birth year]  SUBJECTIVE:  Including CC & ROS.  No chief complaint on file.   Mary Jordan is a 86 y.o. female that is presenting with worsening left hip pain.   Review of Systems See HPI   HISTORY: Past Medical, Surgical, Social, and Family History Reviewed & Updated per EMR.   Pertinent Historical Findings include:  Past Medical History:  Diagnosis Date  . Allergic rhinitis   . Anemia   . B12 deficiency   . COPD (chronic obstructive pulmonary disease) ([place])   . Hyperlipidemia   . Hypertension   . Insomnia   . Laryngopharyngeal reflux (LPR)   . Lung mass    s/p excision of RUL benign 2007  . Menopause   . Osteoarthritis   . Osteoporosis     Past Surgical History:  Procedure Laterality Date  . ABDOMINAL HYSTERECTOMY    . BREAST BIOPSY     remotely, neg  . BREAST EXCISIONAL BIOPSY Left   . CARDIOVASCULAR STRESS TEST  2007   Cardiolite negative  . CATARACT EXTRACTION, BILATERAL    . lesion from lung excised, benign  05-2006    Family History  Problem Relation Age of Onset  . Heart attack Father 49  . Dementia Mother   . Breast cancer Mother   . Asthma Mother   . Hyperlipidemia Neg Hx   . Diabetes Neg Hx   . Stroke Neg Hx   . Colon cancer Neg Hx     Social History   Socioeconomic History  . Marital status: Divorced    Spouse name: Not on file  . Number of children: 2  . Years of education: Not on file  . Highest education level: Not on file  Occupational History  . Occupation: Retired    Fish farm manager: RETIRED    CommentChiropodist  Tobacco Use  . Smoking status: Former Smoker    Packs/day: 1.00    Years: 39.00    Pack years: 39.00    Types: Cigarettes    Quit date: 08/11/1996    Years since quitting: 24.0  . Smokeless tobacco: Never Used  . Tobacco comment:    Vaping Use  . Vaping Use: Never used  Substance and Sexual Activity  . Alcohol use: Yes    Alcohol/week: 6.0 standard  drinks    Types: 3 Glasses of wine, 3 Cans of beer per week    Comment: three times weekly  . Drug use: No  . Sexual activity: Not on file  Other Topics Concern  . Not on file  Social History Narrative   Lives by herself    Has 2 children, 4 gkids all boys        Social Determinants of Health   Financial Resource Strain: Medium Risk  . Difficulty of Paying Living Expenses: Somewhat hard  Food Insecurity: Not on file  Transportation Needs: Not on file  Physical Activity: Not on file  Stress: Not on file  Social Connections: Not on file  Intimate Partner Violence: Not on file     PHYSICAL EXAM:  VS: BP 140/78   Ht 5' 3.5" (1.613 m)   Wt 122 lb (55.3 kg)   BMI 21.27 kg/m  Physical Exam Gen: NAD, alert, cooperative with exam, well-appearing    Aspiration/Injection Procedure Note Mary Jordan 01/28/1935  Procedure: Injection Indications: left hip pain   Procedure Details Consent: Risks of procedure as well as the alternatives  and risks of each were explained to the (patient/caregiver).  Consent for procedure obtained. Time Out: Verified patient identification, verified procedure, site/side was marked, verified correct patient position, special equipment/implants available, medications/allergies/relevent history reviewed, required imaging and test results available.  Performed.  The area was cleaned with iodine and alcohol swabs.    The left hip pain was injected using 5 cc of 1% lidocaine on a 22-gauge 3-1/2 inch needle.  The syringe was switched to mixture containing 1 cc's of 40 mg Kenalog and 4 cc's of 0.25% of bupivacaine was injected.  Ultrasound was used. Images were obtained in short views showing the injection.     A sterile dressing was applied.  Patient did tolerate procedure well.      ASSESSMENT & PLAN:   Hip impingement syndrome, left No improvement with her greater trochanteric injection.  Concerning that this is either coming from the hip  joint itself or the SI joint. -Counseled on home exercise therapy and supportive care. -Hip joint injection. -X-ray. -Could consider physical therapy.

## 2020-09-10 ENCOUNTER — Ambulatory Visit (HOSPITAL_BASED_OUTPATIENT_CLINIC_OR_DEPARTMENT_OTHER)
Admission: RE | Admit: 2020-09-10 | Discharge: 2020-09-10 | Disposition: A | Discharge status: Home / Self Care | Payer: Medicare Other | Source: Ambulatory Visit | Attending: Family Medicine | Admitting: Family Medicine

## 2020-09-10 ENCOUNTER — Other Ambulatory Visit: Payer: Self-pay

## 2020-09-10 DIAGNOSIS — M25852 Other specified joint disorders, left hip: Secondary | ICD-10-CM | POA: Diagnosis not present

## 2020-09-10 DIAGNOSIS — M25552 Pain in left hip: Secondary | ICD-10-CM | POA: Diagnosis not present

## 2020-09-11 ENCOUNTER — Other Ambulatory Visit: Payer: Self-pay | Admitting: *Deleted

## 2020-09-11 ENCOUNTER — Telehealth: Payer: Self-pay | Admitting: Family Medicine

## 2020-09-11 MED ORDER — NAPROXEN 500 MG PO TABS: 500.0000 mg | ORAL_TABLET | Freq: Two times a day (BID) | ORAL | 0 refills | Status: AC | PRN

## 2020-09-11 NOTE — Telephone Encounter (Signed)
Left VM for patient. If she calls back please have her speak with a nurse/CMA and inform that her xray shows a possible area of pinching at the top of the joint which could be causing her symptoms.   If any questions then please take the best time and phone number to call and I will try to call her back.   Rosemarie Ax, MD [facility] 09/11/2020, 8:55 AM

## 2020-09-11 NOTE — Telephone Encounter (Signed)
Forwarding message to med asst to return call to patient w/ Results listed below :   Left VM for patient. If she calls back please have her speak with a nurse/CMA and inform that her xray shows a possible area of pinching at the top of the joint which could be causing her symptoms.   If any questions then please take the best time and phone number to call and I will try to call her back.   Rosemarie Ax, MD [facility] 09/11/2020, 8:55 AM   --Dion Body

## 2020-09-11 NOTE — Telephone Encounter (Signed)
Informed pt of results and called in medication per Dr Raeford Razor

## 2020-09-20 ENCOUNTER — Ambulatory Visit: Payer: Medicare Other | Admitting: Family Medicine

## 2020-09-21 ENCOUNTER — Encounter: Payer: Self-pay | Admitting: Internal Medicine

## 2020-09-21 ENCOUNTER — Ambulatory Visit (INDEPENDENT_AMBULATORY_CARE_PROVIDER_SITE_OTHER): Payer: Medicare Other | Admitting: Internal Medicine

## 2020-09-21 ENCOUNTER — Telehealth: Payer: Self-pay

## 2020-09-21 ENCOUNTER — Other Ambulatory Visit: Payer: Self-pay

## 2020-09-21 VITALS — BP 139/87 | HR 102 | Temp 98.1°F | Ht 63.5 in | Wt 122.0 lb

## 2020-09-21 DIAGNOSIS — M810 Age-related osteoporosis without current pathological fracture: Secondary | ICD-10-CM

## 2020-09-21 DIAGNOSIS — J449 Chronic obstructive pulmonary disease, unspecified: Secondary | ICD-10-CM | POA: Diagnosis not present

## 2020-09-21 DIAGNOSIS — E785 Hyperlipidemia, unspecified: Secondary | ICD-10-CM | POA: Diagnosis not present

## 2020-09-21 DIAGNOSIS — I10 Essential (primary) hypertension: Secondary | ICD-10-CM

## 2020-09-21 MED ORDER — IBANDRONATE SODIUM 150 MG PO TABS: 150.0000 mg | ORAL_TABLET | ORAL | 3 refills | Status: DC

## 2020-09-21 MED ORDER — BREO ELLIPTA 200-25 MCG/INH IN AEPB: 1.0000 | INHALATION_SPRAY | Freq: Every day | RESPIRATORY_TRACT | 12 refills | Status: AC

## 2020-09-21 MED ORDER — RISEDRONATE SODIUM 150 MG PO TABS: 150.0000 mg | ORAL_TABLET | ORAL | 3 refills | Status: DC

## 2020-09-21 NOTE — Telephone Encounter (Signed)
PA denied. Awaiting denial information.  °

## 2020-09-21 NOTE — Telephone Encounter (Signed)
Denied, waiting on denial information. Can you recommend another oral in place or that?   Per PCP- Boniva 150mg  monthly- same precautions.

## 2020-09-21 NOTE — Progress Notes (Signed)
Subjective:    Patient ID: Mary Jordan, female    DOB: August 22, 1934, 86 y.o.   MRN: [id number]  DOS:  09/21/2020 Type of visit - description: F/U In general feeling well. Today with talk about hypertension, osteoporosis, COPD, cholesterol.  Review of Systems Lives by herself, managing well.  Chronic problems seem controlled.  Past Medical History:  Diagnosis Date  . Allergic rhinitis   . Anemia   . B12 deficiency   . COPD (chronic obstructive pulmonary disease) ([place])   . Hyperlipidemia   . Hypertension   . Insomnia   . Laryngopharyngeal reflux (LPR)   . Lung mass    s/p excision of RUL benign 2007  . Menopause   . Osteoarthritis   . Osteoporosis     Past Surgical History:  Procedure Laterality Date  . ABDOMINAL HYSTERECTOMY    . BREAST BIOPSY     remotely, neg  . BREAST EXCISIONAL BIOPSY Left   . CARDIOVASCULAR STRESS TEST  2007   Cardiolite negative  . CATARACT EXTRACTION, BILATERAL    . lesion from lung excised, benign  05-2006    Allergies as of 09/21/2020      Reactions   Levofloxacin Hives      Medication List       Accurate as of September 21, 2020 11:59 PM. If you have any questions, ask your nurse or doctor.        STOP taking these medications   atorvastatin 20 MG tablet Commonly known as: LIPITOR Stopped by: Kathlene November, MD   clindamycin 150 MG capsule Commonly known as: CLEOCIN Stopped by: Kathlene November, MD   predniSONE 5 MG tablet Commonly known as: DELTASONE Stopped by: Kathlene November, MD     TAKE these medications   amLODipine 5 MG tablet Commonly known as: NORVASC Take 1 tablet (5 mg total) by mouth daily.   amoxicillin 500 MG capsule Commonly known as: AMOXIL Take 500 mg by mouth every 6 (six) hours.   Breo Ellipta 200-25 MCG/INH Aepb Generic drug: fluticasone furoate-vilanterol Inhale 1 puff into the lungs daily.   clorazepate 3.75 MG tablet Commonly known as: TRANXENE TAKE ONE TABLET BY MOUTH AT BEDTIME AS NEEDED FOR SLEEP    fluticasone 50 MCG/ACT nasal spray Commonly known as: FLONASE TWO SPRAYS EACH NOSTRIL DAILY   ibandronate 150 MG tablet Commonly known as: Boniva Take 1 tablet (150 mg total) by mouth every 30 (thirty) days. Take in the morning with a full glass of water, on an empty stomach, and do not take anything else by mouth or lie down for the next 45 min. Started by: Bishop Dublin, CMA   naproxen 500 MG tablet Commonly known as: NAPROSYN Take 1 tablet (500 mg total) by mouth 2 (two) times daily as needed.   simvastatin 10 MG tablet Commonly known as: ZOCOR Take 0.5 tablets (5 mg total) by mouth at bedtime. What changed: how much to take Changed by: Kathlene November, MD   Ventolin HFA 108 (90 Base) MCG/ACT inhaler Generic drug: albuterol INHALE 1 TO 2 PUFFS INTO THE LUNGS EVERY6 HOURS AS NEEDED FOR WHEEZING          Objective:   Physical Exam BP 139/87 (BP Location: Right Arm, Patient Position: Sitting, Cuff Size: Large)   Pulse (!) 102   Temp 98.1 F (36.7 C) (Oral)   Ht 5' 3.5" (1.613 m)   Wt 122 lb (55.3 kg)   SpO2 96%   BMI 21.27 kg/m  General:  Well developed, NAD, BMI noted. HEENT:  Normocephalic . Face symmetric, atraumatic Lungs:  CTA B Normal respiratory effort, no intercostal retractions, no accessory muscle use. Heart: RRR,  no murmur.  Lower extremities: no pretibial edema bilaterally  Skin: Not pale. Not jaundice Neurologic:  alert & oriented X3.  Speech normal, gait appropriate for age and unassisted Psych--  Cognition and judgment appear intact.  Cooperative with normal attention span and concentration.  Behavior appropriate. No anxious or depressed appearing.      Assessment     Assessment HTN dx 2021 Hyperlipidemia Insomnia: Chronic, mild COPD PFTs 02/2020 severe airway obstruction, ratio 48, FEV1 0.93/52%, no bronchodilator response, DLCO 63%/11.44 B12 deficiency  Osteoporosis : h/o wrist Fx, T score -2.2  (2013), -2.1 (03-2015) Intolerant to  fosamax, actonel $$, took reclast before ~2013 DJD Menopausal Lung mass RUL, s/p excision benign 2007  PLAN HTN: BP today 139/87, on amlodipine, at home BPs are even better.  Hyperlipidemia: Based on last cholesterol panel I rec  change from simvastatin to atorvastatin, the patient never did, she actually decrease simvastatin to 1/2 tab qd b/c she is afraid of s/e.  Recent R hip pain  made her even more hesitant to take more medicines.  No change for now reassess on RTC Osteoporosis: See previous entry, canceled Prolia injection b/c didn't like to go to the hospital (Covid);  requests oral medicine, previously intol to Fosamax; Actonel was expensive.  No issues swallowing.   Plan: try Actonel, precautions discussed. COPD: Request refill on [facility], previously rx by Dr. Elsworth Soho, she is unable to go to his new office, is too far for her.  Doing well, RF sent. Insomnia: On Tranxene, contract signed NSAIDs: I note that she is taking NSAIDs, recommend to minimize the use, Tylenol okay, take it with food. RTC 3 months   This visit occurred during the SARS-CoV-2 public health emergency.  Safety protocols were in place, including screening questions prior to the visit, additional usage of staff PPE, and extensive cleaning of exam room while observing appropriate contact time as indicated for disinfecting solutions.

## 2020-09-21 NOTE — Telephone Encounter (Signed)
PA initiated via Covermymeds; KEY: [id number]. Awaiting determination.

## 2020-09-21 NOTE — Patient Instructions (Addendum)
Start taking Actonel 150 mg: 1 tablet a month.  This is for osteoporosis. Take it on an empty stomach, sit down for 40 minutes without eating or laying down. If you have any problems while you swallow the tablet or you develop chest pain: Call immediately  Check the  blood pressure regularly  BP GOAL is between 110/65 and  135/85. If it is consistently higher or lower, let me know     GO TO THE FRONT DESK, [place] back for a checkup in 3 months    . Advance Directive  Advance directives are legal documents that allow you to make decisions about your health care and medical treatment in case you become unable to communicate for yourself. Advance directives let your wishes be known to family, friends, and health care providers. Discussing and writing advance directives should happen over time rather than all at once. Advance directives can be changed and updated at any time. There are different types of advance directives, such as:  Medical power of attorney.  Living will.  Do not resuscitate (DNR) order or do not attempt resuscitation (DNAR) order. Health care proxy and medical power of attorney A health care proxy is also called a health care agent. This person is appointed to make medical decisions for you when you are unable to make decisions for yourself. Generally, people ask a trusted friend or family member to act as their proxy and represent their preferences. Make sure you have an agreement with your trusted person to act as your proxy. A proxy may have to make a medical decision on your behalf if your wishes are not known. A medical power of attorney, also called a durable power of attorney for health care, is a legal document that names your health care proxy. Depending on the laws in your state, the document may need to be:  Signed.  Notarized.  Dated.  Copied.  Witnessed.  Incorporated into your medical record. You may also want to  appoint a trusted person to manage your money in the event you are unable to do so. This is called a durable power of attorney for finances. It is a separate legal document from the durable power of attorney for health care. You may choose your health care proxy or someone different to act as your agent in money matters. If you do not appoint a proxy, or there is a concern that the proxy is not acting in your best interest, a court may appoint a guardian to act on your behalf. Living will A living will is a set of instructions that state your wishes about medical care when you cannot express them yourself. Health care providers should keep a copy of your living will in your medical record. You may want to give a copy to family members or friends. To alert caregivers in case of an emergency, you can place a card in your wallet to let them know that you have a living will and where they can find it. A living will is used if you become:  Terminally ill.  Disabled.  Unable to communicate or make decisions. The following decisions should be included in your living will:  To use or not to use life support equipment, such as dialysis machines and breathing machines (ventilators).  Whether you want a DNR or DNAR order. This tells health care providers not to use cardiopulmonary resuscitation (CPR) if breathing or heartbeat stops.  To use or not to use  tube feeding.  To be given or not to be given food and fluids.  Whether you want comfort (palliative) care when the goal becomes comfort rather than a cure.  Whether you want to donate your organs and tissues. A living will does not give instructions for distributing your money and property if you should pass away. DNR or DNAR A DNR or DNAR order is a request not to have CPR in the event that your heart stops beating or you stop breathing. If a DNR or DNAR order has not been made and shared, a health care provider will try to help any patient whose  heart has stopped or who has stopped breathing. If you plan to have surgery, talk with your health care provider about how your DNR or DNAR order will be followed if problems occur. What if I do not have an advance directive? Some states assign family decision makers to act on your behalf if you do not have an advance directive. Each state has its own laws about advance directives. You may want to check with your health care provider, attorney, or state representative about the laws in your state. Summary  Advance directives are legal documents that allow you to make decisions about your health care and medical treatment in case you become unable to communicate for yourself.  The process of discussing and writing advance directives should happen over time. You can change and update advance directives at any time.  Advance directives may include a medical power of attorney, a living will, and a DNR or DNAR order. This information is not intended to replace advice given to you by your health care provider. Make sure you discuss any questions you have with your health care provider. Document Revised: 05/01/2020 Document Reviewed: 05/01/2020 Elsevier Patient Education  2021 [employer].

## 2020-09-23 NOTE — Assessment & Plan Note (Signed)
HTN: BP today 139/87, on amlodipine, at home BPs are even better.  Hyperlipidemia: Based on last cholesterol panel I rec  change from simvastatin to atorvastatin, the patient never did, she actually decrease simvastatin to 1/2 tab qd b/c she is afraid of s/e.  Recent R hip pain  made her even more hesitant to take more medicines.  No change for now reassess on RTC Osteoporosis: See previous entry, canceled Prolia injection b/c didn't like to go to the hospital (Covid);  requests oral medicine, previously intol to Fosamax; Actonel was expensive.  No issues swallowing.   Plan: try Actonel, precautions discussed. COPD: Request refill on [facility], previously rx by Dr. Elsworth Soho, she is unable to go to his new office, is too far for her.  Doing well, RF sent. Insomnia: On Tranxene, contract signed NSAIDs: I note that she is taking NSAIDs, recommend to minimize the use, Tylenol okay, take it with food. RTC 3 months

## 2020-09-24 ENCOUNTER — Other Ambulatory Visit: Payer: Self-pay

## 2020-09-24 ENCOUNTER — Ambulatory Visit (INDEPENDENT_AMBULATORY_CARE_PROVIDER_SITE_OTHER): Payer: Medicare Other | Admitting: Family Medicine

## 2020-09-24 VITALS — BP 118/82 | Ht 63.5 in | Wt 122.0 lb

## 2020-09-24 DIAGNOSIS — M25852 Other specified joint disorders, left hip: Secondary | ICD-10-CM

## 2020-09-24 DIAGNOSIS — M810 Age-related osteoporosis without current pathological fracture: Secondary | ICD-10-CM | POA: Diagnosis not present

## 2020-09-24 NOTE — Assessment & Plan Note (Signed)
History of low bone mass  - bone scan.

## 2020-09-24 NOTE — Addendum Note (Signed)
Addended by: Rosemarie Ax on: 09/24/2020 02:13 PM   Modules accepted: Orders

## 2020-09-24 NOTE — Progress Notes (Addendum)
Mary Jordan - 86 y.o. female MRN [id number]  Date of birth: 05/15/1935  SUBJECTIVE:  Including CC & ROS.  No chief complaint on file.   Mary Jordan is a 86 y.o. female that is following up for her left knee pain.  She has got significant improvement with the intra-articular injection.  She denies the pain that she is feeling in the leg and around the hip joint itself.  Independent review of the left hip x-ray from 1/31 shows minor degenerative changes of the left hip joint.   Review of Systems See HPI   HISTORY: Past Medical, Surgical, Social, and Family History Reviewed & Updated per EMR.   Pertinent Historical Findings include:  Past Medical History:  Diagnosis Date  . Allergic rhinitis   . Anemia   . B12 deficiency   . COPD (chronic obstructive pulmonary disease) ([place])   . Hyperlipidemia   . Hypertension   . Insomnia   . Laryngopharyngeal reflux (LPR)   . Lung mass    s/p excision of RUL benign 2007  . Menopause   . Osteoarthritis   . Osteoporosis     Past Surgical History:  Procedure Laterality Date  . ABDOMINAL HYSTERECTOMY    . BREAST BIOPSY     remotely, neg  . BREAST EXCISIONAL BIOPSY Left   . CARDIOVASCULAR STRESS TEST  2007   Cardiolite negative  . CATARACT EXTRACTION, BILATERAL    . lesion from lung excised, benign  05-2006    Family History  Problem Relation Age of Onset  . Heart attack Father 6  . Dementia Mother   . Breast cancer Mother   . Asthma Mother   . Hyperlipidemia Neg Hx   . Diabetes Neg Hx   . Stroke Neg Hx   . Colon cancer Neg Hx     Social History   Socioeconomic History  . Marital status: Divorced    Spouse name: Not on file  . Number of children: 2  . Years of education: Not on file  . Highest education level: Not on file  Occupational History  . Occupation: Retired    Fish farm manager: RETIRED    CommentChiropodist  Tobacco Use  . Smoking status: Former Smoker    Packs/day: 1.00    Years: 39.00    Pack years:  39.00    Types: Cigarettes    Quit date: 08/11/1996    Years since quitting: 24.1  . Smokeless tobacco: Never Used  . Tobacco comment:    Vaping Use  . Vaping Use: Never used  Substance and Sexual Activity  . Alcohol use: Yes    Alcohol/week: 6.0 standard drinks    Types: 3 Glasses of wine, 3 Cans of beer per week    Comment: three times weekly  . Drug use: No  . Sexual activity: Not on file  Other Topics Concern  . Not on file  Social History Narrative   Lives by herself    Has 2 children, 4 gkids all boys           Social Determinants of Health   Financial Resource Strain: Medium Risk  . Difficulty of Paying Living Expenses: Somewhat hard  Food Insecurity: Not on file  Transportation Needs: Not on file  Physical Activity: Not on file  Stress: Not on file  Social Connections: Not on file  Intimate Partner Violence: Not on file     PHYSICAL EXAM:  VS: BP 118/82 (BP Location: Left Arm, Patient Position: Sitting, Cuff  Size: Normal)   Ht 5' 3.5" (1.613 m)   Wt 122 lb (55.3 kg)   BMI 21.27 kg/m  Physical Exam Gen: NAD, alert, cooperative with exam, well-appearing  ASSESSMENT & PLAN:   Hip impingement syndrome, left Significant improvement with the hip joint injection.  Likely has degenerative labral issues contributing to the impingement. -Counseled on home exercise therapy and supportive care. -Could consider physical therapy  Osteoporosis History of low bone mass  - bone scan.

## 2020-09-24 NOTE — Patient Instructions (Signed)
Good to see you Please use ice as needed  Please use tylenol as needed  Please try the exercises   Please send me a message in MyChart with any questions or updates.  Please see [place] back as needed.   --Dr. Raeford Razor

## 2020-09-24 NOTE — Assessment & Plan Note (Signed)
Significant improvement with the hip joint injection.  Likely has degenerative labral issues contributing to the impingement. -Counseled on home exercise therapy and supportive care. -Could consider physical therapy

## 2020-09-25 ENCOUNTER — Ambulatory Visit (HOSPITAL_BASED_OUTPATIENT_CLINIC_OR_DEPARTMENT_OTHER)
Admission: RE | Admit: 2020-09-25 | Discharge: 2020-09-25 | Disposition: A | Discharge status: Home / Self Care | Payer: Medicare Other | Source: Ambulatory Visit | Attending: Family Medicine | Admitting: Family Medicine

## 2020-09-25 DIAGNOSIS — M810 Age-related osteoporosis without current pathological fracture: Secondary | ICD-10-CM | POA: Diagnosis not present

## 2020-09-25 DIAGNOSIS — Z780 Asymptomatic menopausal state: Secondary | ICD-10-CM | POA: Diagnosis not present

## 2020-09-25 DIAGNOSIS — M8589 Other specified disorders of bone density and structure, multiple sites: Secondary | ICD-10-CM | POA: Diagnosis not present

## 2020-09-26 ENCOUNTER — Telehealth: Payer: Self-pay | Admitting: Family Medicine

## 2020-09-26 NOTE — Telephone Encounter (Signed)
Informed of bone density. Could check vitamin D.   Rosemarie Ax, MD [facility] 09/26/2020, 8:29 AM

## 2020-09-27 ENCOUNTER — Other Ambulatory Visit (HOSPITAL_BASED_OUTPATIENT_CLINIC_OR_DEPARTMENT_OTHER): Payer: Medicare Other

## 2020-10-10 ENCOUNTER — Telehealth: Payer: Self-pay | Admitting: Primary Care

## 2020-10-10 MED ORDER — AZITHROMYCIN 250 MG PO TABS: 250.0000 mg | ORAL_TABLET | ORAL | 0 refills | Status: DC

## 2020-10-10 NOTE — Telephone Encounter (Signed)
Called and spoke with Patient. Patient stated she has a sinus infection. Patient stated symptoms started last Thursday with sinus congestion, and has gotten worse. Patient stated was Covid tested at a local [facility] Monday and results were negative. Patient stated she is having sinus, nose, eye pressure, headaches, and her ears are stopped up. Patient stated she is unable to check her temp, but she has had chills at times. Patient has taken flonase, but it has not helped.  Patient stated she tried sudafed, but it makes her feel funny, so she stopped taking it. Patient request any prescriptions to be sent to [facility].   Message routed to Dr. Elsworth Soho to advise

## 2020-10-10 NOTE — Telephone Encounter (Signed)
Pt aware of recs and I have sent zpack  Nothing further needed

## 2020-10-10 NOTE — Telephone Encounter (Signed)
Z pak

## 2020-10-18 NOTE — Progress Notes (Signed)
Chronic Care Management Pharmacy Note  10/23/2020 Name:  Mary Jordan MRN:  [id number] DOB:  [birth year]  Subjective: Mary Jordan is an 86 y.o. year old female who is a primary patient of Paz, Alda Berthold, MD.  The CCM team was consulted for assistance with disease management and care coordination needs.    Engaged with patient by telephone for follow up visit in response to provider referral for pharmacy case management and/or care coordination services.   Consent to Services:  The patient was given the following information about Chronic Care Management services today, agreed to services, and gave verbal consent: 1. CCM service includes personalized support from designated clinical staff supervised by the primary care provider, including individualized plan of care and coordination with other care providers 2. 24/7 contact phone numbers for assistance for urgent and routine care needs. 3. Service will only be billed when office clinical staff spend 20 minutes or more in a month to coordinate care. 4. Only one practitioner may furnish and bill the service in a calendar month. 5.The patient may stop CCM services at any time (effective at the end of the month) by phone call to the office staff. 6. The patient will be responsible for cost sharing (co-pay) of up to 20% of the service fee (after annual deductible is met). Patient agreed to services and consent obtained.  Patient Care Team: Colon Branch, MD as PCP - General Joya Gaskins Burnett Harry, MD as Attending Physician (Pulmonary Disease) Parrett, Fonnie Mu, NP as Nurse Practitioner (Pulmonary Disease) Maggie Schwalbe., MD as Consulting Physician (Otolaryngology) Edythe Clarity, [facility] (Pharmacist)  Recent office visits: 09/21/20 Larose Kells) - patient cancelled Prolia, plan to start Actonel - PA denied and plan to start Boniva 11m every 30 days.  Recent consult visits: 03/22/20: Podiatry visit w/ Dr. PBarkley Bruns 02/27/20: Pulmonology visit w/ Dr.  AElsworth Soho- Lung function stable. Discussed step up to Trelegy, but BMemory Dancehas worked well for patient, so [facility] None in previous 6 months  Objective:  Lab Results  Component Value Date   CREATININE 0.62 04/12/2020   BUN 11 04/12/2020   GFR 95.65 07/19/2019   GFRNONAA >60 09/23/2019   GFRAA >60 09/23/2019   NA 140 04/12/2020   K 4.2 04/12/2020   CALCIUM 9.3 04/12/2020   CO2 28 04/12/2020    Lab Results  Component Value Date/Time   GFR 95.65 07/19/2019 10:11 AM   GFR 103.93 06/18/2018 10:53 AM    Last diabetic Eye exam: No results found for: HMDIABEYEEXA  Last diabetic Foot exam: No results found for: HMDIABFOOTEX   Lab Results  Component Value Date   CHOL 241 (H) 03/21/2020   HDL 82.80 03/21/2020   LDLCALC 136 (H) 03/21/2020   LDLDIRECT 186.2 08/02/2012   TRIG 109.0 03/21/2020   CHOLHDL 3 03/21/2020    Hepatic Function Latest Ref Rng & Units 09/23/2019 07/19/2019 12/11/2017  Total Protein 6.5 - 8.1 g/dL 7.5 6.6 6.8  Albumin 3.5 - 5.0 g/dL 4.2 4.3 4.1  AST 15 - 41 U/L '20 15 20  ' ALT 0 - 44 U/L '19 13 15  ' Alk Phosphatase 38 - 126 U/L 56 61 59  Total Bilirubin 0.3 - 1.2 mg/dL 0.6 0.4 0.4    Lab Results  Component Value Date/Time   TSH 2.18 11/13/2016 10:53 AM   TSH 1.46 08/02/2012 09:25 AM    CBC Latest Ref Rng & Units 09/23/2019 07/19/2019 06/18/2018  WBC 4.0 -  10.5 K/uL 8.3 6.7 6.5  Hemoglobin 12.0 - 15.0 g/dL 14.7 15.0 14.0  Hematocrit 36.0 - 46.0 % 46.4(H) 46.3(H) 42.3  Platelets 150 - 400 K/uL 384 369.0 356.0    Lab Results  Component Value Date/Time   VD25OH 71 03/26/2011 09:37 AM   VD25OH 53 11/23/2009 11:26 PM    Clinical ASCVD: No  The ASCVD Risk score Mikey Bussing DC Jr., et al., 2013) failed to calculate for the following reasons:   The 2013 ASCVD risk score is only valid for ages 72 to 99    Depression screen PHQ 2/9 09/21/2020 09/21/2020 07/19/2019  Decreased Interest 0 0 0  Down, Depressed, Hopeless 0 0 0  PHQ - 2  Score 0 0 0  Altered sleeping 0 - -  Tired, decreased energy 0 - -  Change in appetite 0 - -  Feeling bad or failure about yourself  0 - -  Trouble concentrating 0 - -  Moving slowly or fidgety/restless 0 - -  Suicidal thoughts 0 - -  PHQ-9 Score 0 - -  Difficult doing work/chores Not difficult at all - -      Social History   Tobacco Use  Smoking Status Former Smoker   Packs/day: 1.00   Years: 39.00   Pack years: 39.00   Types: Cigarettes   Quit date: 08/11/1996   Years since quitting: 24.2  Smokeless Tobacco Never Used  Tobacco Comment       BP Readings from Last 3 Encounters:  09/24/20 118/82  09/21/20 139/87  08/23/20 140/78   Pulse Readings from Last 3 Encounters:  09/21/20 (!) 102  03/21/20 80  02/27/20 94   Wt Readings from Last 3 Encounters:  09/24/20 122 lb (55.3 kg)  09/21/20 122 lb (55.3 kg)  08/23/20 122 lb (55.3 kg)    Assessment/Interventions: Review of patient past medical history, allergies, medications, health status, including review of consultants reports, laboratory and other test data, was performed as part of comprehensive evaluation and provision of chronic care management services.   SDOH:  (Social Determinants of Health) assessments and interventions performed: Yes   CCM Care Plan  Allergies  Allergen Reactions   Levofloxacin Hives    Medications Reviewed Today    Reviewed by Edythe Clarity, [facility] (Pharmacist) on 10/23/20 at [place] List Status: <None>  Medication Order Taking? Sig Documenting Provider Last Dose Status Informant  amLODipine (NORVASC) 5 MG tablet [id number] Yes Take 1 tablet (5 mg total) by mouth daily.  Patient taking differently: Take 2.5 mg by mouth daily.   Colon Branch, MD Taking Active   clorazepate (TRANXENE) 3.75 MG tablet [id number] Yes TAKE ONE TABLET BY MOUTH AT BEDTIME AS NEEDED FOR SLEEP Colon Branch, MD Taking Active            Med Note Lynita Lombard Jul 26, 2020  4:49 PM) Called insurance  to discuss medication moving to Tier 4. Brief Summary: Cost will be $42.64 until she meets her $400 deductible, then it goes down to about $21 per 30DS  fluticasone (FLONASE) 50 MCG/ACT nasal spray [id number] Yes TWO SPRAYS EACH NOSTRIL DAILY Rigoberto Noel, MD Taking Active   fluticasone furoate-vilanterol (BREO ELLIPTA) 200-25 MCG/INH AEPB [id number] Yes Inhale 1 puff into the lungs daily. Colon Branch, MD Taking Active   ibandronate (BONIVA) 150 MG tablet [id number]  Take 1 tablet (150 mg total) by mouth every 30 (thirty) days. Take in the morning with a full glass  of water, on an empty stomach, and do not take anything else by mouth or lie down for the next 45 min.  Patient not taking: Reported on 09/24/2020   Colon Branch, MD  Active   naproxen (NAPROSYN) 500 MG tablet [id number]  Take 1 tablet (500 mg total) by mouth 2 (two) times daily as needed.  Patient taking differently: Take 500 mg by mouth 2 (two) times a week.   Rosemarie Ax, MD  Active   simvastatin (ZOCOR) 10 MG tablet [id number]  Take 0.5 tablets (5 mg total) by mouth at bedtime. Colon Branch, MD  Active   VENTOLIN HFA 108 [id number]) MCG/ACT inhaler [id number] Yes INHALE 1 TO 2 PUFFS INTO THE LUNGS EVERY6 HOURS AS NEEDED FOR WHEEZING Martyn Ehrich, NP Taking Active           Patient Active Problem List   Diagnosis Date Noted   Hip impingement syndrome, left 08/20/2020   Laryngopharyngeal reflux (LPR) 11/26/2016   PCP NOTES >>>> 06/30/2015   COPD (chronic obstructive pulmonary disease) ([place]) 05/31/2015   Hypertension 11/16/2013   Annual physical exam 03/26/2011   CERVICAL RADICULOPATHY, LEFT 10/14/2010   COPD exacerbation ([place]) 09/05/2010   Allergic rhinitis 05/29/2010   ANEMIA, B12 DEFICIENCY 11/05/2007   Dyslipidemia 10/06/2007   DJD (degenerative joint disease) 10/06/2007   Osteoporosis 06/07/2007   INSOMNIA, CHRONIC, MILD 04/05/2007    Immunization History  Administered Date(s) Administered    Influenza Split 05/12/2011, 04/15/2012   Influenza Whole 06/07/2007, 04/28/2008, 05/25/2009, 06/28/2010   Influenza, High Dose Seasonal PF 06/12/2016, 04/18/2017, 03/25/2019   Influenza,inj,Quad PF,6+ Mos 05/30/2013, 04/20/2014, 05/31/2015   Influenza-Unspecified 04/11/2017, 05/11/2018, 04/10/2020   PFIZER(Purple Top)SARS-COV-2 Vaccination 09/19/2019, 10/10/2019, 05/06/2020   Pneumococcal Conjugate-13 04/20/2014   Pneumococcal Polysaccharide-23 04/05/2007, 04/15/2012   Td 04/05/2007   Tdap 08/04/2016   Zoster 11/05/2007   Zoster Recombinat (Shingrix) 01/14/2018, 05/05/2018    Conditions to be addressed/monitored:  Hypertension, Hyperlipidemia, COPD, Insomnia  Care Plan : General Pharmacy (Adult)  Updates made by Edythe Clarity, RPH since 10/23/2020 12:00 AM    Problem: HTN, HLD, COPD, Insomnia   Priority: High  Onset Date: 10/23/2020    Long-Range Goal: Patient-Specific Goal   Start Date: 10/23/2020  Expected End Date: 04/25/2021  This Visit's Progress: On track  Priority: High  Note:   Current Barriers:   Unable to independently afford treatment regimen  Pharmacist Clinical Goal(s):   Over the next 90 days, patient will verbalize ability to afford treatment regimen  maintain control of blood pressure as evidenced by home monitoring   contact provider office for questions/concerns as evidenced notation of same in electronic health record through collaboration with PharmD and provider.   Interventions:  1:1 collaboration with Colon Branch, MD regarding development and update of comprehensive plan of care as evidenced by provider attestation and co-signature  Inter-disciplinary care team collaboration (see longitudinal plan of care)  Comprehensive medication review performed; medication list updated in electronic medical record  Hypertension (BP goal <140/90) -Controlled -Current treatment:  Amlodipine 21m daily PM (Patient taking half tablet 2.578m   -Medications previously tried: none noted  -Current home readings: 137/86, 122/81, 128/85, 118/83, 145/75, 127/88  -Denies hypotensive/hypertensive symptoms -Educated on BP goals and benefits of medications for prevention of heart attack, stroke and kidney damage; Importance of home blood pressure monitoring; -Counseled to monitor BP at home a few times per week, document, and provide log at future appointments -Recommended to continue current medication  Hyperlipidemia: (  LDL goal < 100) -Not ideally controlled -Current treatment:  Simvastatin 43m daily (patient taking 1/2 tab daily) -Medications previously tried: none noted   -Educated on Cholesterol goals;  Benefits of statin for ASCVD risk reduction; Importance of limiting foods high in cholesterol;  -Still taking half tablet. -Patient has upcoming visit in June and would recommend repeat lipid panel -If remains elevated could consider switch to Atorvastatin 290m also consider patients age -Recommended to continue current medication  COPD (Goal: control symptoms and prevent exacerbations) -Controlled -Current treatment   Breo Ellipta 200-2578m puff daily  Ventolin HFA 1-2 puffs every 6 hours as needed -Medications previously tried: none noted   Last spirometry score: 4/20/20217 FVC 2.4 (90%) FEV1 1.1 (54%) FEV1/FVC (59%) -Exacerbations requiring treatment in last 6 months: none noted -Patient reports consistent use of maintenance inhaler - she uses it daily -Frequency of rescue inhaler use: daily recently due to allergies, previously was only using prn -Counseled on Benefits of consistent maintenance inhaler use When to use rescue inhaler [facility] Assessed patient finances. She reports Breo copay is sometimes a burden.  She is on SSI income only, will have CCM team reach out about starting PAP application for  Breo.  Insomnia (Goal: Quality sleep)  Update 07/26/20 Called [employer] and spoke to JeaGeringo informed me that the contracted price with [facility] this medication is $42.64.  On August 11, 2020, the patient would be responsible for this cost until her $400 deductible is met. After deductible is met, the patient is responsible for 48% of the cost of the medication for Tier 4 drugs versus a flat copay of $37 for Tier 3 medications.  Fortunately for this patient, the medication moving to Tier 4 should work in her favor, because she will now be responsible for 48% of $42.64 which is $20.47 per 30DS (a price cheaper than her current price of $37/30DS).  -Controlled -Current treatment   Clorazepate 3.44m55mily at bedtime as needed (takes half-tablet) -Medications previously tried: none noted  -Recommended to continue current medication   Patient Goals/Self-Care Activities  Over the next 90 days, patient will:  - take medications as prescribed check blood pressure daily, document, and provide at future appointments collaborate with provider on medication access solutions  Follow Up Plan: The care management team will reach out to the patient again over the next 90 days.       Medication Assistance: Application for Breo  medication assistance program. in process.  Anticipated assistance start date unknown.  See plan of care for additional detail.  Patient's preferred pharmacy is:  [facility] [id number]: [phone]: [phone] endorses 100% compliance  We discussed: Benefits of medication synchronization, packaging and delivery as well as enhanced pharmacist oversight with [employer]. Patient decided to: Continue current medication management strategy  Care Plan and Follow Up Patient Decision:  Patient agrees to Care Plan and Follow-up.  Plan: The care management team will reach out  to the patient again over the next 90 days.  ChriBeverly MilcharmD Clinical Pharmacist [phone]

## 2020-10-23 ENCOUNTER — Ambulatory Visit (INDEPENDENT_AMBULATORY_CARE_PROVIDER_SITE_OTHER): Payer: Medicare Other | Admitting: Pharmacist

## 2020-10-23 ENCOUNTER — Other Ambulatory Visit: Payer: Self-pay

## 2020-10-23 DIAGNOSIS — E785 Hyperlipidemia, unspecified: Secondary | ICD-10-CM | POA: Diagnosis not present

## 2020-10-23 DIAGNOSIS — J449 Chronic obstructive pulmonary disease, unspecified: Secondary | ICD-10-CM | POA: Diagnosis not present

## 2020-10-23 DIAGNOSIS — I10 Essential (primary) hypertension: Secondary | ICD-10-CM | POA: Diagnosis not present

## 2020-10-23 MED ORDER — VENTOLIN HFA 108 (90 BASE) MCG/ACT IN AERS: 1.0000 | INHALATION_SPRAY | Freq: Four times a day (QID) | RESPIRATORY_TRACT | 5 refills | Status: AC | PRN

## 2020-10-23 NOTE — Patient Instructions (Addendum)
Visit Information  Goals Addressed            This Visit's Progress   . Manage My Medicine       Timeframe:  Long-Range Goal Priority:  High Start Date:   10/23/20                          Expected End Date: 04/25/21                      Follow Up Date 02/07/21   - call for medicine refill 2 or 3 days before it runs out - keep a list of all the medicines I take; vitamins and herbals too - use a pillbox to sort medicine    Why is this important?   . These steps will help you keep on track with your medicines.   Notes: Contact provider with any cost barriers that prevent you from taking your medications.      Patient Care Plan: General Pharmacy (Adult)    Problem Identified: HTN, HLD, COPD, Insomnia   Priority: High  Onset Date: 10/23/2020    Long-Range Goal: Patient-Specific Goal   Start Date: 10/23/2020  Expected End Date: 04/25/2021  This Visit's Progress: On track  Priority: High  Note:   Current Barriers:  . Unable to independently afford treatment regimen  Pharmacist Clinical Goal(s):  Marland Kitchen Over the next 90 days, patient will verbalize ability to afford treatment regimen . maintain control of blood pressure as evidenced by home monitoring  . contact provider office for questions/concerns as evidenced notation of same in electronic health record through collaboration with PharmD and provider.   Interventions: . 1:1 collaboration with Colon Branch, MD regarding development and update of comprehensive plan of care as evidenced by provider attestation and co-signature . Inter-disciplinary care team collaboration (see longitudinal plan of care) . Comprehensive medication review performed; medication list updated in electronic medical record  Hypertension (BP goal <140/90) -Controlled -Current treatment:  Amlodipine 36m daily PM (Patient taking half tablet 2.559m  -Medications previously tried: none noted  -Current home readings: 137/86, 122/81, 128/85, 118/83,  145/75, 127/88  -Denies hypotensive/hypertensive symptoms -Educated on BP goals and benefits of medications for prevention of heart attack, stroke and kidney damage; Importance of home blood pressure monitoring; -Counseled to monitor BP at home a few times per week, document, and provide log at future appointments -Recommended to continue current medication  Hyperlipidemia: (LDL goal < 100) -Not ideally controlled -Current treatment:  Simvastatin 1056maily (patient taking 1/2 tab daily) -Medications previously tried: none noted   -Educated on Cholesterol goals;  Benefits of statin for ASCVD risk reduction; Importance of limiting foods high in cholesterol;  -Still taking half tablet. -Patient has upcoming visit in June and would recommend repeat lipid panel -If remains elevated could consider switch to Atorvastatin 83m71mlso consider patients age -Recommended to continue current medication  COPD (Goal: control symptoms and prevent exacerbations) -Controlled -Current treatment   Breo Ellipta 200-25mg32muff daily  Ventolin HFA 1-2 puffs every 6 hours as needed -Medications previously tried: none noted   Last spirometry score: 4/20/20217 FVC 2.4 (90%) FEV1 1.1 (54%) FEV1/FVC (59%) -Exacerbations requiring treatment in last 6 months: none noted -Patient reports consistent use of maintenance inhaler - she uses it daily -Frequency of rescue inhaler use: daily recently due to allergies, previously was only using prn -Counseled on Benefits of consistent maintenance inhaler use When to use  rescue inhaler Patient request refill on rescue inhaler sent to [facility] -Recommended to continue current medication Assessed patient finances. She reports Breo copay is sometimes a burden.  She is on SSI income only, will have CCM team reach out about starting PAP application for Breo.  Insomnia (Goal: Quality sleep)  Update 07/26/20 Called [employer] and spoke to [place] who informed me that  the contracted price with [facility] for this medication is $42.64.  On August 11, 2020, the patient would be responsible for this cost until her $400 deductible is met. After deductible is met, the patient is responsible for 48% of the cost of the medication for Tier 4 drugs versus a flat copay of $37 for Tier 3 medications.  Fortunately for this patient, the medication moving to Tier 4 should work in her favor, because she will now be responsible for 48% of $42.64 which is $20.47 per 30DS (a price cheaper than her current price of $37/30DS).  -Controlled -Current treatment   Clorazepate 3.19m daily at bedtime as needed (takes half-tablet) -Medications previously tried: none noted  -Recommended to continue current medication   Patient Goals/Self-Care Activities . Over the next 90 days, patient will:  - take medications as prescribed check blood pressure daily, document, and provide at future appointments collaborate with provider on medication access solutions  Follow Up Plan: The care management team will reach out to the patient again over the next 90 days.       The patient verbalized understanding of instructions, educational materials, and care plan provided today and agreed to receive a mailed copy of patient instructions, educational materials, and care plan.  Telephone follow up appointment with pharmacy team member scheduled for: 3 months  [facility] and Physical Activity Chronic obstructive pulmonary disease (COPD) is a long-term (chronic) condition that affects the lungs. COPD is a general term that can be used to describe many different lung problems that cause lung swelling (inflammation) and limit airflow, including chronic bronchitis and emphysema. The main symptom of COPD is shortness of breath, which makes it harder to do even simple tasks. This can also make it harder to exercise and be active. Talk with your health care provider about treatments to  help you breathe better and actions you can take to prevent breathing problems during physical activity. What are the benefits of exercising with COPD? Exercising regularly is an important part of a healthy lifestyle. You can still exercise and do physical activities even though you have COPD. Exercise and physical activity improve your shortness of breath by increasing blood flow (circulation). This causes your heart to pump more oxygen through your body. Moderate exercise can improve your:  Oxygen use.  Energy level.  Shortness of breath.  Strength in your breathing muscles.  Heart health.  Sleep.  Self-esteem and feelings of self-worth.  Depression, stress, and anxiety levels. Exercise can benefit everyone with COPD. The severity of your disease may affect how hard you can exercise, especially at first, but everyone can benefit. Talk with your health care provider about how much exercise is safe for you, and which activities and exercises are safe for you.   What actions can I take to prevent breathing problems during physical activity?  Sign up for a pulmonary rehabilitation program. This type of program may include: ? Education about lung diseases. ? Exercise classes that teach you how to exercise and be more active while improving your breathing. This usually involves:  Exercise using  your lower extremities, such as a stationary bicycle.  About 30 minutes of exercise, 2 to 5 times per week, for 6 to 12 weeks  Strength training, such as push ups or leg lifts. ? Nutrition education. ? Group classes in which you can talk with others who also have COPD and learn ways to manage stress.  If you use an oxygen tank, you should use it while you exercise. Work with your health care provider to adjust your oxygen for your physical activity. Your resting flow rate is different from your flow rate during physical activity.  While you are exercising: ? Take slow breaths. ? Pace yourself  and do not try to go too fast. ? Purse your lips while breathing out. Pursing your lips is similar to a kissing or whistling position. ? If doing exercise that uses a quick burst of effort, such as weight lifting:  Breathe in before starting the exercise.  Breathe out during the hardest part of the exercise (such as raising the weights). Where to find support You can find support for exercising with COPD from:  Your health care provider.  A pulmonary rehabilitation program.  Your local health department or community health programs.  Support groups, online or in-person. Your health care provider may be able to recommend support groups. Where to find more information You can find more information about exercising with COPD from:  American Lung Association: ClassInsider.se.  COPD Foundation: https://www.rivera.net/. Contact a health care provider if:  Your symptoms get worse.  You have chest pain.  You have nausea.  You have a fever.  You have trouble talking or catching your breath.  You want to start a new exercise program or a new activity. Summary  COPD is a general term that can be used to describe many different lung problems that cause lung swelling (inflammation) and limit airflow. This includes chronic bronchitis and emphysema.  Exercise and physical activity improve your shortness of breath by increasing blood flow (circulation). This causes your heart to provide more oxygen to your body.  Contact your health care provider before starting any exercise program or new activity. Ask your health care provider what exercises and activities are safe for you. This information is not intended to replace advice given to you by your health care provider. Make sure you discuss any questions you have with your health care provider. Document Revised: 11/17/2018 Document Reviewed: 08/20/2017 Elsevier Patient Education  2021 [employer].

## 2020-10-24 ENCOUNTER — Telehealth: Payer: Self-pay | Admitting: Pharmacist

## 2020-10-24 NOTE — Progress Notes (Addendum)
° ° °  Chronic Care Management Pharmacy Assistant   Name: GENISE STRACK  MRN: [id number] DOB: 1934/10/10  Reason for Encounter: PAP   Medications: Outpatient Encounter Medications as of 10/24/2020  Medication Sig Note   amLODipine (NORVASC) 5 MG tablet Take 1 tablet (5 mg total) by mouth daily. (Patient taking differently: Take 2.5 mg by mouth daily.)    clorazepate (TRANXENE) 3.75 MG tablet TAKE ONE TABLET BY MOUTH AT BEDTIME AS NEEDED FOR SLEEP 07/26/2020: Called insurance to discuss medication moving to Tier 4. Brief Summary: Cost will be $42.64 until she meets her $400 deductible, then it goes down to about $21 per 30DS   fluticasone (FLONASE) 50 MCG/ACT nasal spray TWO SPRAYS EACH NOSTRIL DAILY    fluticasone furoate-vilanterol (BREO ELLIPTA) 200-25 MCG/INH AEPB Inhale 1 puff into the lungs daily.    ibandronate (BONIVA) 150 MG tablet Take 1 tablet (150 mg total) by mouth every 30 (thirty) days. Take in the morning with a full glass of water, on an empty stomach, and do not take anything else by mouth or lie down for the next 45 min. (Patient not taking: Reported on 09/24/2020)    naproxen (NAPROSYN) 500 MG tablet Take 1 tablet (500 mg total) by mouth 2 (two) times daily as needed. (Patient taking differently: Take 500 mg by mouth 2 (two) times a week.)    simvastatin (ZOCOR) 10 MG tablet Take 0.5 tablets (5 mg total) by mouth at bedtime.    VENTOLIN HFA 108 (90 Base) MCG/ACT inhaler Inhale 1-2 puffs into the lungs every 6 (six) hours as needed for wheezing or shortness of breath.    No facility-administered encounter medications on file as of 10/24/2020.    New patient assistance application form filled out to [facility] for Breo Ellipta 200-25 MCG. Waiting for patient and provider to complete and sign documentation. Called patient to inquire if they wanted the application mailed to them or if they wanted to come into the office. Patient is required to sign application and to bring/have proof of  income.She stated she would be willing to come into office to bring proof of income and sign application once she receives application in the mail she would like it mailed to their residence address [place] [id number]  Follow-Up: Pharmacist Review  Charlann Lange, [place] Pharmacist Assistant [phone]  4 minutes spent in review, coordination, and documentation.  Reviewed by: Beverly Milch, PharmD Clinical Pharmacist [facility] [phone]

## 2020-11-07 ENCOUNTER — Other Ambulatory Visit: Payer: Self-pay | Admitting: Internal Medicine

## 2020-11-19 DIAGNOSIS — Z23 Encounter for immunization: Secondary | ICD-10-CM | POA: Diagnosis not present

## 2020-11-28 DIAGNOSIS — H524 Presbyopia: Secondary | ICD-10-CM | POA: Diagnosis not present

## 2020-11-28 DIAGNOSIS — H52223 Regular astigmatism, bilateral: Secondary | ICD-10-CM | POA: Diagnosis not present

## 2020-11-28 DIAGNOSIS — H35033 Hypertensive retinopathy, bilateral: Secondary | ICD-10-CM | POA: Diagnosis not present

## 2020-11-28 DIAGNOSIS — H26491 Other secondary cataract, right eye: Secondary | ICD-10-CM | POA: Diagnosis not present

## 2020-11-28 DIAGNOSIS — D4981 Neoplasm of unspecified behavior of retina and choroid: Secondary | ICD-10-CM | POA: Diagnosis not present

## 2020-11-28 DIAGNOSIS — Z961 Presence of intraocular lens: Secondary | ICD-10-CM | POA: Diagnosis not present

## 2020-12-06 ENCOUNTER — Telehealth: Payer: Self-pay | Admitting: Internal Medicine

## 2020-12-06 DIAGNOSIS — L720 Epidermal cyst: Secondary | ICD-10-CM | POA: Diagnosis not present

## 2020-12-06 DIAGNOSIS — D692 Other nonthrombocytopenic purpura: Secondary | ICD-10-CM | POA: Diagnosis not present

## 2020-12-06 DIAGNOSIS — D225 Melanocytic nevi of trunk: Secondary | ICD-10-CM | POA: Diagnosis not present

## 2020-12-06 DIAGNOSIS — L821 Other seborrheic keratosis: Secondary | ICD-10-CM | POA: Diagnosis not present

## 2020-12-06 DIAGNOSIS — D1801 Hemangioma of skin and subcutaneous tissue: Secondary | ICD-10-CM | POA: Diagnosis not present

## 2020-12-06 DIAGNOSIS — Z86008 Personal history of in-situ neoplasm of other site: Secondary | ICD-10-CM | POA: Diagnosis not present

## 2020-12-06 NOTE — Telephone Encounter (Signed)
Requesting:clorazepate 3.75mg  Contract: 09/21/20 UDS: 03/21/2020 Last Visit: 09/21/20 Next Visit: 01/09/21 Last Refill: 06/13/2020 #30 and 2rf  Please Advise

## 2020-12-06 NOTE — Telephone Encounter (Signed)
PDMP okay, Rx sent 

## 2020-12-19 ENCOUNTER — Ambulatory Visit: Payer: Medicare Other | Admitting: Internal Medicine

## 2020-12-28 ENCOUNTER — Other Ambulatory Visit: Payer: Self-pay | Admitting: Internal Medicine

## 2021-01-01 ENCOUNTER — Other Ambulatory Visit: Payer: Self-pay

## 2021-01-01 ENCOUNTER — Telehealth: Payer: Self-pay | Admitting: Internal Medicine

## 2021-01-01 ENCOUNTER — Telehealth (INDEPENDENT_AMBULATORY_CARE_PROVIDER_SITE_OTHER): Payer: Medicare Other | Admitting: Family

## 2021-01-01 ENCOUNTER — Encounter: Payer: Self-pay | Admitting: Family

## 2021-01-01 VITALS — BP 149/90 | HR 100

## 2021-01-01 DIAGNOSIS — J029 Acute pharyngitis, unspecified: Secondary | ICD-10-CM

## 2021-01-01 MED ORDER — BINAXNOW COVID-19 AG HOME TEST VI KIT: PACK | 0 refills | Status: DC

## 2021-01-01 MED ORDER — AMOXICILLIN 500 MG PO CAPS: 500.0000 mg | ORAL_CAPSULE | Freq: Three times a day (TID) | ORAL | 0 refills | Status: DC

## 2021-01-01 NOTE — Progress Notes (Signed)
Virtual telephone visit    Virtual Visit via Telephone Note   This visit type was conducted due to national recommendations for restrictions regarding the COVID-19 Pandemic (e.g. social distancing) in an effort to limit this patient's exposure and mitigate transmission in our community. Due to her co-morbid illnesses, this patient is at least at moderate risk for complications without adequate follow up. This format is felt to be most appropriate for this patient at this time. The patient did not have access to video technology or had technical difficulties with video requiring transitioning to audio format only (telephone). Physical exam was limited to content and character of the telephone converstion. CMA was able to get the patient set up on a telephone visit.   Patient location: home Patient and provider in visit Provider location: Office  I discussed the limitations of evaluation and management by telemedicine and the availability of in person appointments. The patient expressed understanding and agreed to proceed.   Visit Date: 01/01/2021  Today's healthcare provider: Nance Pear, NP     Subjective:    Patient ID: Mary Jordan, female    DOB: 1935/01/10, 86 y.o.   MRN: [id number]  Chief Complaint  Patient presents with  . Sore Throat    Complains of sore throat that started Friday.     HPI   Patient presents today with chief complaint of sore throat. She reports that her sore throat is 6/10. Sore throat began on Friday 5/20 and is now associated with ear fullness. She is taking an otc generic allergy pill (unsure of name) along with  Flonase, and a cough/chest congestion syrup.  She is also taking ES tylenol "which is the only thing that seems to be helping." She has not beenj tested for COVID.  Her only outings in the last week were to the dentist and the grocery store.  Past Medical History:  Diagnosis Date  . Allergic rhinitis   . Anemia   . B12  deficiency   . COPD (chronic obstructive pulmonary disease) ([place])   . Hyperlipidemia   . Hypertension   . Insomnia   . Laryngopharyngeal reflux (LPR)   . Lung mass    s/p excision of RUL benign 2007  . Menopause   . Osteoarthritis   . Osteoporosis     Past Surgical History:  Procedure Laterality Date  . ABDOMINAL HYSTERECTOMY    . BREAST BIOPSY     remotely, neg  . BREAST EXCISIONAL BIOPSY Left   . CARDIOVASCULAR STRESS TEST  2007   Cardiolite negative  . CATARACT EXTRACTION, BILATERAL    . lesion from lung excised, benign  05-2006    Family History  Problem Relation Age of Onset  . Heart attack Father 14  . Dementia Mother   . Breast cancer Mother   . Asthma Mother   . Hyperlipidemia Neg Hx   . Diabetes Neg Hx   . Stroke Neg Hx   . Colon cancer Neg Hx     Social History   Socioeconomic History  . Marital status: Divorced    Spouse name: Not on file  . Number of children: 2  . Years of education: Not on file  . Highest education level: Not on file  Occupational History  . Occupation: Retired    Fish farm manager: RETIRED    CommentChiropodist  Tobacco Use  . Smoking status: Former Smoker    Packs/day: 1.00    Years: 39.00    Pack years: 39.00  Types: Cigarettes    Quit date: 08/11/1996    Years since quitting: 24.4  . Smokeless tobacco: Never Used  . Tobacco comment:    Vaping Use  . Vaping Use: Never used  Substance and Sexual Activity  . Alcohol use: Yes    Alcohol/week: 6.0 standard drinks    Types: 3 Glasses of wine, 3 Cans of beer per week    Comment: three times weekly  . Drug use: No  . Sexual activity: Not on file  Other Topics Concern  . Not on file  Social History Narrative   Lives by herself    Has 2 children, 4 gkids all boys           Social Determinants of Health   Financial Resource Strain: Medium Risk  . Difficulty of Paying Living Expenses: Somewhat hard  Food Insecurity: Not on file  Transportation Needs: Not on file  Physical  Activity: Not on file  Stress: Not on file  Social Connections: Not on file  Intimate Partner Violence: Not on file    Outpatient Medications Prior to Visit  Medication Sig Dispense Refill  . amLODipine (NORVASC) 5 MG tablet Take 1 tablet (5 mg total) by mouth daily. 90 tablet 0  . clorazepate (TRANXENE) 3.75 MG tablet Take 1 tablet (3.75 mg total) by mouth at bedtime as needed for sleep. 30 tablet 2  . fluticasone (FLONASE) 50 MCG/ACT nasal spray TWO SPRAYS EACH NOSTRIL DAILY 16 g 2  . fluticasone furoate-vilanterol (BREO ELLIPTA) 200-25 MCG/INH AEPB Inhale 1 puff into the lungs daily. 60 each 12  . ibandronate (BONIVA) 150 MG tablet Take 1 tablet (150 mg total) by mouth every 30 (thirty) days. Take in the morning with a full glass of water, on an empty stomach, and do not take anything else by mouth or lie down for the next 45 min. 3 tablet 3  . naproxen (NAPROSYN) 500 MG tablet Take 1 tablet (500 mg total) by mouth 2 (two) times daily as needed. (Patient taking differently: Take 500 mg by mouth 2 (two) times a week.) 60 tablet 0  . simvastatin (ZOCOR) 10 MG tablet Take 1 tablet (10 mg total) by mouth at bedtime. 90 tablet 1  . VENTOLIN HFA 108 (90 Base) MCG/ACT inhaler Inhale 1-2 puffs into the lungs every 6 (six) hours as needed for wheezing or shortness of breath. 18 g 5   No facility-administered medications prior to visit.    Allergies  Allergen Reactions  . Levofloxacin Hives    ROS     Objective:    Physical Exam Constitutional:      Appearance: She is well-developed.  Neurological:     Mental Status: She is alert.  Psychiatric:        Attention and Perception: Attention normal.        Mood and Affect: Mood normal.        Speech: Speech normal.        Behavior: Behavior normal.        Cognition and Memory: Cognition normal.     BP (!) 149/90   Pulse 100  Wt Readings from Last 3 Encounters:  09/24/20 122 lb (55.3 kg)  09/21/20 122 lb (55.3 kg)  08/23/20 122 lb  (55.3 kg)    Diabetic Foot Exam - Simple   No data filed    Lab Results  Component Value Date   WBC 8.3 09/23/2019   HGB 14.7 09/23/2019   HCT 46.4 (H) 09/23/2019  PLT 384 09/23/2019   GLUCOSE 85 04/12/2020   CHOL 241 (H) 03/21/2020   TRIG 109.0 03/21/2020   HDL 82.80 03/21/2020   LDLDIRECT 186.2 08/02/2012   LDLCALC 136 (H) 03/21/2020   ALT 19 09/23/2019   AST 20 09/23/2019   NA 140 04/12/2020   K 4.2 04/12/2020   CL 106 04/12/2020   CREATININE 0.62 04/12/2020   BUN 11 04/12/2020   CO2 28 04/12/2020   TSH 2.18 11/13/2016   INR 0.9 09/23/2019    Lab Results  Component Value Date   TSH 2.18 11/13/2016   Lab Results  Component Value Date   WBC 8.3 09/23/2019   HGB 14.7 09/23/2019   HCT 46.4 (H) 09/23/2019   MCV 91.9 09/23/2019   PLT 384 09/23/2019   Lab Results  Component Value Date   NA 140 04/12/2020   K 4.2 04/12/2020   CO2 28 04/12/2020   GLUCOSE 85 04/12/2020   BUN 11 04/12/2020   CREATININE 0.62 04/12/2020   BILITOT 0.6 09/23/2019   ALKPHOS 56 09/23/2019   AST 20 09/23/2019   ALT 19 09/23/2019   PROT 7.5 09/23/2019   ALBUMIN 4.2 09/23/2019   CALCIUM 9.3 04/12/2020   ANIONGAP 11 09/23/2019   GFR 95.65 07/19/2019   Lab Results  Component Value Date   CHOL 241 (H) 03/21/2020   Lab Results  Component Value Date   HDL 82.80 03/21/2020   Lab Results  Component Value Date   LDLCALC 136 (H) 03/21/2020   Lab Results  Component Value Date   TRIG 109.0 03/21/2020   Lab Results  Component Value Date   CHOLHDL 3 03/21/2020   No results found for: HGBA1C     Assessment & Plan:   Problem List Items Addressed This Visit   None     I am having Mary Jordan. Mary Jordan maintain her fluticasone, naproxen, Breo Ellipta, ibandronate, Ventolin HFA, simvastatin, clorazepate, and amLODipine.  No orders of the defined types were placed in this encounter.    I discussed the assessment and treatment plan with the patient. The patient was provided  an opportunity to ask questions and all were answered. The patient agreed with the plan and demonstrated an understanding of the instructions.   The patient was advised to call back or seek an in-person evaluation if the symptoms worsen or if the condition fails to improve as anticipated.  I provided 11 minutes of non-face-to-face time during this encounter.   Nance Pear, NP [employer] [facility] [employer] [facility] [phone] (phone) [phone] (fax)  [place]

## 2021-01-01 NOTE — Assessment & Plan Note (Signed)
New. I advised pt to complete a home test for COVID today and notify us if +.  Also will plan empiric rx for strep throat/OM with amoxicillin 500mg  tid x 10 days. She is advised to call if symptoms worsen or if symptoms fail to improve. Continue tylenol prn pain.

## 2021-01-01 NOTE — Telephone Encounter (Signed)
Did covid testing it was negative  Please advice

## 2021-01-02 NOTE — Telephone Encounter (Signed)
Patient advised to continue medication, she reports she is doing about the same. Advised patient to call us back if no improvement or if worsening symptoms.

## 2021-01-02 NOTE — Telephone Encounter (Signed)
Good news. Please continue amoxicillin as we discussed at her appointment and let me know if her symptoms worsen or if they fail to improve.

## 2021-01-03 DIAGNOSIS — D4981 Neoplasm of unspecified behavior of retina and choroid: Secondary | ICD-10-CM | POA: Diagnosis not present

## 2021-01-03 DIAGNOSIS — Z961 Presence of intraocular lens: Secondary | ICD-10-CM | POA: Diagnosis not present

## 2021-01-03 DIAGNOSIS — H35033 Hypertensive retinopathy, bilateral: Secondary | ICD-10-CM | POA: Diagnosis not present

## 2021-01-03 DIAGNOSIS — H26491 Other secondary cataract, right eye: Secondary | ICD-10-CM | POA: Diagnosis not present

## 2021-01-09 ENCOUNTER — Ambulatory Visit (INDEPENDENT_AMBULATORY_CARE_PROVIDER_SITE_OTHER): Payer: Medicare Other | Admitting: Internal Medicine

## 2021-01-09 ENCOUNTER — Other Ambulatory Visit: Payer: Self-pay

## 2021-01-09 VITALS — BP 155/91 | HR 88 | Temp 97.5°F | Ht 63.5 in | Wt 117.0 lb

## 2021-01-09 DIAGNOSIS — J441 Chronic obstructive pulmonary disease with (acute) exacerbation: Secondary | ICD-10-CM

## 2021-01-09 DIAGNOSIS — I10 Essential (primary) hypertension: Secondary | ICD-10-CM | POA: Diagnosis not present

## 2021-01-09 DIAGNOSIS — E785 Hyperlipidemia, unspecified: Secondary | ICD-10-CM

## 2021-01-09 DIAGNOSIS — M810 Age-related osteoporosis without current pathological fracture: Secondary | ICD-10-CM

## 2021-01-09 LAB — CBC WITH DIFFERENTIAL/PLATELET
Basophils Absolute: 0.1 10*3/uL (ref 0.0–0.1)
Basophils Relative: 0.9 % (ref 0.0–3.0)
Eosinophils Absolute: 0.1 10*3/uL (ref 0.0–0.7)
Eosinophils Relative: 1.8 % (ref 0.0–5.0)
HCT: 40.7 % (ref 36.0–46.0)
Hemoglobin: 13.6 g/dL (ref 12.0–15.0)
Lymphocytes Relative: 26.6 % (ref 12.0–46.0)
Lymphs Abs: 1.4 10*3/uL (ref 0.7–4.0)
MCHC: 33.3 g/dL (ref 30.0–36.0)
MCV: 84.5 fl (ref 78.0–100.0)
Monocytes Absolute: 0.4 10*3/uL (ref 0.1–1.0)
Monocytes Relative: 7.5 % (ref 3.0–12.0)
Neutro Abs: 3.4 10*3/uL (ref 1.4–7.7)
Neutrophils Relative %: 63.2 % (ref 43.0–77.0)
Platelets: 394 10*3/uL (ref 150.0–400.0)
RBC: 4.82 Mil/uL (ref 3.87–5.11)
RDW: 14.3 % (ref 11.5–15.5)
WBC: 5.4 10*3/uL (ref 4.0–10.5)

## 2021-01-09 LAB — COMPREHENSIVE METABOLIC PANEL
ALT: 12 U/L (ref 0–35)
AST: 18 U/L (ref 0–37)
Albumin: 4.1 g/dL (ref 3.5–5.2)
Alkaline Phosphatase: 74 U/L (ref 39–117)
BUN: 9 mg/dL (ref 6–23)
CO2: 27 mEq/L (ref 19–32)
Calcium: 9.6 mg/dL (ref 8.4–10.5)
Chloride: 102 mEq/L (ref 96–112)
Creatinine, Ser: 0.55 mg/dL (ref 0.40–1.20)
GFR: 84.58 mL/min (ref >60.00)
Glucose, Bld: 97 mg/dL (ref 70–99)
Potassium: 4.2 mEq/L (ref 3.5–5.1)
Sodium: 137 mEq/L (ref 135–145)
Total Bilirubin: 0.4 mg/dL (ref 0.2–1.2)
Total Protein: 6.6 g/dL (ref 6.0–8.3)

## 2021-01-09 LAB — LIPID PANEL
Cholesterol: 171 mg/dL (ref 0–200)
HDL: 52.6 mg/dL (ref >39.00)
LDL Cholesterol: 103 mg/dL — ABNORMAL HIGH (ref 0–99)
NonHDL: 118.21
Total CHOL/HDL Ratio: 3
Triglycerides: 76 mg/dL (ref 0.0–149.0)
VLDL: 15.2 mg/dL (ref 0.0–40.0)

## 2021-01-09 MED ORDER — AZITHROMYCIN 250 MG PO TABS: ORAL_TABLET | ORAL | 0 refills | Status: DC

## 2021-01-09 NOTE — Patient Instructions (Signed)
Check the  blood pressure   BP GOAL is between 110/65 and  135/85. If it is consistently higher or lower, let me know  For cough: Zithromax Robitussin-DM Continue Flonase and antihistaminic Call if not gradually better  Continue vitamin D    GO TO THE LAB : Get the blood work     Good luck in your new place!   Fall Prevention in the Home, Adult Falls can cause injuries and can affect people from all age groups. There are many simple things that you can do to make your home safe and to help prevent falls. Ask for help when making these changes, if needed. What actions can I take to prevent falls? General instructions  Use good lighting in all rooms. Replace any light bulbs that burn out.  Turn on lights if it is dark. Use night-lights.  Place frequently used items in easy-to-reach places. Lower the shelves around your home if necessary.  Set up furniture so that there are clear paths around it. Avoid moving your furniture around.  Remove throw rugs and other tripping hazards from the floor.  Avoid walking on wet floors.  Fix any uneven floor surfaces.  Add color or contrast paint or tape to grab bars and handrails in your home. Place contrasting color strips on the first and last steps of stairways.  When you use a stepladder, make sure that it is completely opened and that the sides are firmly locked. Have someone hold the ladder while you are using it. Do not climb a closed stepladder.  Be aware of any and all pets. What can I do in the bathroom?  Keep the floor dry. Immediately clean up any water that spills onto the floor.  Remove soap buildup in the tub or shower on a regular basis.  Use non-skid mats or decals on the floor of the tub or shower.  Attach bath mats securely with double-sided, non-slip rug tape.  If you need to sit down while you are in the shower, use a plastic, non-slip stool.  Install grab bars by the toilet and in the tub and shower. Do  not use towel bars as grab bars.      What can I do in the bedroom?  Make sure that a bedside light is easy to reach.  Do not use oversized bedding that drapes onto the floor.  Have a firm chair that has side arms to use for getting dressed. What can I do in the kitchen?  Clean up any spills right away.  If you need to reach for something above you, use a sturdy step stool that has a grab bar.  Keep electrical cables out of the way.  Do not use floor polish or wax that makes floors slippery. If you must use wax, make sure that it is non-skid floor wax. What can I do in the stairways?  Do not leave any items on the stairs.  Make sure that you have a light switch at the top of the stairs and the bottom of the stairs. Have them installed if you do not have them.  Make sure that there are handrails on both sides of the stairs. Fix handrails that are broken or loose. Make sure that handrails are as long as the stairways.  Install non-slip stair treads on all stairs in your home.  Avoid having throw rugs at the top or bottom of stairways, or secure the rugs with carpet tape to prevent them from moving.  Choose a carpet design that does not hide the edge of steps on the stairway.  Check any carpeting to make sure that it is firmly attached to the stairs. Fix any carpet that is loose or worn. What can I do on the outside of my home?  Use bright outdoor lighting.  Regularly repair the edges of walkways and driveways and fix any cracks.  Remove high doorway thresholds.  Trim any shrubbery on the main path into your home.  Regularly check that handrails are securely fastened and in good repair. Both sides of any steps should have handrails.  Install guardrails along the edges of any raised decks or porches.  Clear walkways of debris and clutter, including tools and rocks.  Have leaves, snow, and ice cleared regularly.  Use sand or salt on walkways during winter months.  In  the garage, clean up any spills right away, including grease or oil spills. What other actions can I take?  Wear closed-toe shoes that fit well and support your feet. Wear shoes that have rubber soles or low heels.  Use mobility aids as needed, such as canes, walkers, scooters, and crutches.  Review your medicines with your health care provider. Some medicines can cause dizziness or changes in blood pressure, which increase your risk of falling. Talk with your health care provider about other ways that you can decrease your risk of falls. This may include working with a physical therapist or trainer to improve your strength, balance, and endurance. Where to find more information  Centers for Disease Control and Prevention, [facility]: WebmailGuide.co.za  [employer] on Aging: BrainJudge.co.uk Contact a health care provider if:  You are afraid of falling at home.  You feel weak, drowsy, or dizzy at home.  You fall at home. Summary  There are many simple things that you can do to make your home safe and to help prevent falls.  Ways to make your home safe include removing tripping hazards and installing grab bars in the bathroom.  Ask for help when making these changes in your home. This information is not intended to replace advice given to you by your health care provider. Make sure you discuss any questions you have with your health care provider. Document Revised: 07/10/2017 Document Reviewed: 03/12/2017 Elsevier Patient Education  2021 [employer].

## 2021-01-09 NOTE — Progress Notes (Signed)
Subjective:    Patient ID: Mary Jordan, female    DOB: [birth year], 86 y.o.   MRN: [id number]  DOS:  01/09/2021 Type of visit - description: Follow-up  Today with her about high blood pressure, high cholesterol, vaccination and screenings.  Also, she developed cough approximately 10 days ago. The cough is productive with whitish sputum. She also have some sneezing. Denies any fever chills No nausea or vomiting No chest pain or difficulty breathing. She blows up in the mucus from the nose.  She thinks she has a sinus infection.  BP Readings from Last 3 Encounters:  01/09/21 (!) 155/91  01/01/21 (!) 149/90  09/24/20 118/82    Review of Systems See above   Past Medical History:  Diagnosis Date  . Allergic rhinitis   . Anemia   . B12 deficiency   . COPD (chronic obstructive pulmonary disease) ([place])   . Hyperlipidemia   . Hypertension   . Insomnia   . Laryngopharyngeal reflux (LPR)   . Lung mass    s/p excision of RUL benign 2007  . Menopause   . Osteoarthritis   . Osteoporosis     Past Surgical History:  Procedure Laterality Date  . ABDOMINAL HYSTERECTOMY    . BREAST BIOPSY     remotely, neg  . BREAST EXCISIONAL BIOPSY Left   . CARDIOVASCULAR STRESS TEST  2007   Cardiolite negative  . CATARACT EXTRACTION, BILATERAL    . lesion from lung excised, benign  05-2006    Allergies as of 01/09/2021      Reactions   Levofloxacin Hives      Medication List       Accurate as of January 09, 2021 11:59 PM. If you have any questions, ask your nurse or doctor.        STOP taking these medications   amoxicillin 500 MG capsule Commonly known as: AMOXIL Stopped by: Kathlene November, MD   BinaxNOW COVID-19 Ag Home Test Kit Generic drug: COVID-19 At Home Antigen Test Stopped by: Kathlene November, MD   ibandronate 150 MG tablet Commonly known as: Boniva Stopped by: Kathlene November, MD     TAKE these medications   amLODipine 5 MG tablet Commonly known as: NORVASC Take 1 tablet (5 mg  total) by mouth daily.   azithromycin 250 MG tablet Commonly known as: Zithromax Z-Pak 2 tabs a day the first day, then 1 tab a day x 4 days Started by: Kathlene November, MD   Breo Ellipta 200-25 MCG/INH Aepb Generic drug: fluticasone furoate-vilanterol Inhale 1 puff into the lungs daily.   clorazepate 3.75 MG tablet Commonly known as: TRANXENE Take 1 tablet (3.75 mg total) by mouth at bedtime as needed for sleep.   fluticasone 50 MCG/ACT nasal spray Commonly known as: FLONASE TWO SPRAYS EACH NOSTRIL DAILY   naproxen 500 MG tablet Commonly known as: NAPROSYN Take 1 tablet (500 mg total) by mouth 2 (two) times daily as needed.   simvastatin 10 MG tablet Commonly known as: ZOCOR Take 1 tablet (10 mg total) by mouth at bedtime.   Ventolin HFA 108 (90 Base) MCG/ACT inhaler Generic drug: albuterol Inhale 1-2 puffs into the lungs every 6 (six) hours as needed for wheezing or shortness of breath.          Objective:   Physical Exam BP (!) 155/91 (BP Location: Right Arm, Patient Position: Sitting, Cuff Size: Large)   Pulse 88   Temp (!) 97.5 F (36.4 C) (Temporal)   Ht 5'  3.5" (1.613 m)   Wt 117 lb (53.1 kg)   SpO2 96%   BMI 20.40 kg/m  General:   Well developed, NAD, BMI noted.  HEENT:  Normocephalic . Face symmetric, atraumatic Lungs:  Few rhonchi with cough Normal respiratory effort, no intercostal retractions, no accessory muscle use. Heart: RRR,  no murmur.  Abdomen:  Not distended, soft, non-tender. No rebound or rigidity.   Skin: Not pale. Not jaundice Lower extremities: no pretibial edema bilaterally  Neurologic:  alert & oriented X3.  Speech normal, gait appropriate for age and unassisted Psych--  Cognition and judgment appear intact.  Cooperative with normal attention span and concentration.  Behavior appropriate. No anxious or depressed appearing.     Assessment       Assessment HTN dx 2021 Hyperlipidemia Insomnia: Chronic, mild COPD PFTs 02/2020  severe airway obstruction, ratio 48, FEV1 0.93/52%, no bronchodilator response, DLCO 63%/11.44 B12 deficiency  Osteoporosis : h/o wrist Fx, T score -2.2  (2013), -2.1 (03-2015) Intolerant to fosamax, actonel $$, took reclast before ~2013 DJD Menopausal Lung mass RUL, s/p excision benign 2007  PLAN HTN: BP slightly elevated today, at home consistently 120, 130.  Continue amlodipine, check a CMP, CBC. Hyperlipidemia: On simvastatin 10 mg daily.  See previous entries.  Check FLP.  I will consider a low-dose of Crestor if she needs better control however she is moving out of the area so most likely I will ask her to discuss with her new PCP. Osteoporosis, see last visit, decided not to try Actonel. Had a DEXA Rx by sports meds 09/25/2020: T score -2.4. Her only treatment at this point is vitamin D.  She is aware of risk of fractures, so far we have been unable to treat with any medications.  See previous entries. COPD exacerbation: Mild COPD exacerbation as described above, COVID test was negative x1, took amoxicillin, no help.  Plan: Zithromax, Robitussin-DM, call if not gradually better.  Continue Flonase and antihistaminic OTC. Preventive care discussed Social: Moving to [facility] in few weeks to  stay close to her family. RTC as needed       This visit occurred during the SARS-CoV-2 public health emergency.  Safety protocols were in place, including screening questions prior to the visit, additional usage of staff PPE, and extensive cleaning of exam room while observing appropriate contact time as indicated for disinfecting solutions.

## 2021-01-10 NOTE — Assessment & Plan Note (Signed)
HTN: BP slightly elevated today, at home consistently 120, 130.  Continue amlodipine, check a CMP, CBC. Hyperlipidemia: On simvastatin 10 mg daily.  See previous entries.  Check FLP.  I will consider a low-dose of Crestor if Mary Jordan needs better control however Mary Jordan is moving out of the area so most likely I will ask her to discuss with her new PCP. Osteoporosis, see last visit, decided not to try Actonel. Had a DEXA Rx by sports med[facility] 09/25/2020: T score -2.4. Her only treatment at this point is vitamin D.  Mary Jordan is aware of risk of fractures, so far we have been unable to treat with any medications.  See previous entries. COPD exacerbation: Mild COPD exacerbation as described above, COVID test was negative x1, took amoxicillin, no help.  Plan: Zithromax, Robitussin-DM, call if not gradually better.  Continue Flonase and antihistaminic OTC. Preventive care discussed Social: Moving to [facility] in few weeks to  stay close to her family. RTC as needed

## 2021-01-10 NOTE — Assessment & Plan Note (Signed)
--  Td 2017; pnm 23:  2008, 2013 ; prevnar 2015; zostavax: 2009; s/p shingrex; s/p covid vax x 4 --[facility]: Cscope 03-2008 Dr Olevia Perches 2 polyps (Hyperplastic and tubular adenoma) , colonoscopy again 11-15- 2013, 1 polyp. Does not desire more cscopes ---Breast Ca screening:mother had breast ca in her 62, pt never had a abnormal mmg-bx; MMG 05-2017 (-); declines further scrrening -- H/o hysterectomy, asx , no further screenings .

## 2021-01-15 DIAGNOSIS — L720 Epidermal cyst: Secondary | ICD-10-CM | POA: Diagnosis not present

## 2021-01-15 DIAGNOSIS — L570 Actinic keratosis: Secondary | ICD-10-CM | POA: Diagnosis not present

## 2021-01-24 ENCOUNTER — Ambulatory Visit (INDEPENDENT_AMBULATORY_CARE_PROVIDER_SITE_OTHER): Payer: Medicare Other | Admitting: Pharmacist

## 2021-01-24 DIAGNOSIS — I10 Essential (primary) hypertension: Secondary | ICD-10-CM | POA: Diagnosis not present

## 2021-01-24 DIAGNOSIS — M810 Age-related osteoporosis without current pathological fracture: Secondary | ICD-10-CM | POA: Diagnosis not present

## 2021-01-24 DIAGNOSIS — J449 Chronic obstructive pulmonary disease, unspecified: Secondary | ICD-10-CM

## 2021-01-24 DIAGNOSIS — E785 Hyperlipidemia, unspecified: Secondary | ICD-10-CM | POA: Diagnosis not present

## 2021-01-24 NOTE — Patient Instructions (Signed)
Mary Jordan  It was a pleasure speaking with you today. Please feel free to contact me if you have any questions or concerns. Below is information regarding your health goals and information about the pharmacies we discussed. I also included some tips on preventing falls and calcium and vitamin D supplementation to help with bone health.   Keep up the good work!  Cherre Robins, PharmD Clinical Pharmacist [facility] [phone]  Visit Information  PATIENT GOALS:  Goals Addressed             This Visit's Progress    Chronic Care Management Pharmacy Care Plan       CARE PLAN ENTRY (see longitudinal plan of care for additional care plan information)  Current Barriers:  Chronic Disease Management support, education, and care coordination needs related to Hypertension, Hyperlipidemia, COPD, Insomnia   Hypertension BP Readings from Last 3 Encounters:  01/09/21 (!) 155/91  01/01/21 (!) 149/90  09/24/20 118/82  Pharmacist Clinical Goal(s): Over the next 90 days, patient will work with PharmD and providers to achieve BP goal <140/90 Current regimen:  Amlodipine 5mg  - take 1/2 tablet twice a day  Interventions: Discussed blood pressure goal Patient self care activities - Over the next 90 days, patient will: Check blood pressure 1 or 2 times per week, document, and provide at future appointments Ensure daily salt intake < 2300 mg/day Maintain hypertension medication regimen.   Hyperlipidemia Lab Results  Component Value Date/Time   LDLCALC 103 (H) 01/09/2021 10:56 AM   LDLDIRECT 186.2 08/02/2012 09:25 AM  Pharmacist Clinical Goal(s): Over the next 90 days, patient will work with PharmD and providers to achieve LDL goal < 100 Current regimen:  Simvastatin 10mg  daily  Interventions: Discussed LDL goal  Patient self care activities - Over the next 90 days, patient will: Maintain cholesterol medication regimen.   Insomnia Pharmacist  Clinical Goal(s) Over the next 90 days, patient will work with PharmD and providers to reduce symptoms associated with insomnia Current regimen:  Clorazepate 3.75mg  daily at bedtime as needed Patient self care activities - Over the next 90 days, patient will: Maintain insomnia medication regimen  COPD  Pharmacist Clinical Goal(s) Over the next 90 days, patient will work with PharmD and providers to reduce symptom of COPD and prevent exacerbations Current regimen:  Breo Ellipta 200-25mg  1 puff daily Ventolin HFA 1-2 puffs every 6 hours as needed Patient self care activities - Over the next 90 days, patient will: Continue current medication If you reach Medicare Coverage gap and cost of Breo increases >$37/month, discuss possible assistance program from the manufacturer of [place] with your primary care physician.    Osteoporosis:  Pharmacist Clinical Goal(s): Over the next 90 days, patient will work with PharmD and providers to prevent fracture and falls Current regimen:  none  Patient self care activities - Over the next 90 days, patient will::  Recommended 1200mg  daily of calcium from supplements and diet Recommended over the counter vitamin D 1000 units daily.  Continue to walk daily Fall prevention strategies (see attached information)  Medication management Pharmacist Clinical Goal(s): Over the next 90 days, patient will work with PharmD and providers to maintain optimal medication adherence Current pharmacy: [facility] Comprehensive medication review performed. Continue current medication management strategy Patient self care activities - Over the next 90 days, patient will: Focus on medication adherence by filling and taking medications appropriately  Take medications as prescribed Report any questions or concerns to PharmD  and/or provider(s) Patient will be moving in August to [place] to be closer to family.  Once you find a pharmacy in [facility] you  can have [facility] transfer prescriptions to that pharmacy. [facility] in [place], [place] offers delivery [phone] [facility] is close to your new home [phone]    Please see past updates related to this goal by clicking on the "Past Updates" button in the selected goal        Manage My Medicine   On track    Timeframe:  Long-Range Goal Priority:  High Start Date:   10/23/20                          Expected End Date: 04/25/21                      Follow Up Date 02/07/21   - call for medicine refill 2 or 3 days before it runs out - keep a list of all the medicines I take; vitamins and herbals too - use a pillbox to sort medicine    Why is this important?   These steps will help you keep on track with your medicines.   Notes: Contact provider with any cost barriers that prevent you from taking your medications.         The patient verbalized understanding of instructions, educational materials, and care plan provided today and agreed to receive a mailed copy of patient instructions, educational materials, and care plan.   No further follow up required: patient is moving in August 2022  Calcium Content in Foods Calcium is the most abundant mineral in the body. Most of the body's calcium supply is stored in bones and teeth. Calcium helps many parts of the body function normally, including: Blood and blood vessels. Nerves. Hormones. Muscles. Bones and teeth. When your calcium stores are low, you may be at risk for low bone mass, bone loss, and broken bones (fractures). When you get enough calcium, it helps to support strong bones and teeththroughout your life. Calcium is especially important for: Children during growth spurts. Girls during adolescence. Women who are pregnant or breastfeeding. Women after their menstrual cycle stops (postmenopause). Women whose menstrual cycle has stopped due to anorexia nervosa or regular intense exercise. People who  cannot eat or digest dairy products. Vegans. Recommended daily amounts of calcium: Women (ages 23 to 51): 1,000 mg per day. Women (ages 63 and older): 1,200 mg per day. Men (ages 32 to 55): 1,000 mg per day. Men (ages 23 and older): 1,200 mg per day. Women (ages 36 to 60): 1,300 mg per day. Men (ages 41 to 40): 1,300 mg per day. General information Eat foods that are high in calcium. Try to get most of your calcium from food. Some people may benefit from taking calcium supplements. Check with your health care provider or diet and nutrition specialist (dietitian) before starting any calcium supplements. Calcium supplements may interact with certain medicines. Too much calcium may cause other health problems, such as constipation and kidney stones. For the body to absorb calcium, it needs vitamin D. Sources of vitamin D include: Skin exposure to direct sunlight. Foods, such as egg yolks, liver, mushrooms, saltwater fish, and fortified milk. Vitamin D supplements. Check with your health care provider or dietitian before starting any vitamin D supplements. What foods are high in calcium?  Foods that are high in calcium contain more  than 100 milligrams per serving. Fruits Fortified orange juice or other fruit juice, 300 mg per 8 oz serving. Vegetables Collard greens, 360 mg per 8 oz serving. Kale, 100 mg per 8 oz serving. Bok choy, 160 mg per 8 oz serving. Grains Fortified ready-to-eat cereals, 100 to 1,000 mg per 8 oz serving. Fortified frozen waffles, 200 mg in 2 waffles. Oatmeal, 140 mg in 1 cup. Meats and other proteins Sardines, canned with bones, 325 mg per 3 oz serving. Salmon, canned with bones, 180 mg per 3 oz serving. Canned shrimp, 125 mg per 3 oz serving. Baked beans, 160 mg per 4 oz serving. Tofu, firm, made with calcium sulfate, 253 mg per 4 oz serving. Dairy Yogurt, plain, low-fat, 310 mg per 6 oz serving. Nonfat milk, 300 mg per 8 oz serving. American cheese, 195 mg per  1 oz serving. Cheddar cheese, 205 mg per 1 oz serving. Cottage cheese 2%, 105 mg per 4 oz serving. Fortified soy, rice, or almond milk, 300 mg per 8 oz serving. Mozzarella, part skim, 210 mg per 1 oz serving. The items listed above may not be a complete list of foods high in calcium. Actual amounts of calcium may be different depending on processing. Contact a dietitian for more information. What foods are lower in calcium? Foods that are lower in calcium contain 50 mg or less per serving. Fruits Apple, about 6 mg. Banana, about 12 mg. Vegetables Lettuce, 19 mg per 2 oz serving. Tomato, about 11 mg. Grains Rice, 4 mg per 6 oz serving. Boiled potatoes, 14 mg per 8 oz serving. White bread, 6 mg per slice. Meats and other proteins Egg, 27 mg per 2 oz serving. Red meat, 7 mg per 4 oz serving. Chicken, 17 mg per 4 oz serving. Fish, cod, or trout, 20 mg per 4 oz serving. Dairy Cream cheese, regular, 14 mg per 1 Tbsp serving. Brie cheese, 50 mg per 1 oz serving. Parmesan cheese, 70 mg per 1 Tbsp serving. The items listed above may not be a complete list of foods lower in calcium. Actual amounts of calcium may be different depending on processing. Contact a dietitian for more information. Summary Calcium is an important mineral in the body because it affects many functions. Getting enough calcium helps support strong bones and teeth throughout your life. Try to get most of your calcium from food. Calcium supplements may interact with certain medicines. Check with your health care provider or dietitian before starting any calcium supplements. This information is not intended to replace advice given to you by your health care provider. Make sure you discuss any questions you have with your healthcare provider. Document Revised: 11/23/2019 Document Reviewed: 11/23/2019 Elsevier Patient Education  2022 [place] Prevention in the Home, Adult Falls can cause injuries and can affect  people from all age groups. There are many simple things that you can do to make your home safe and to help preventfalls. Ask for help when making these changes, if needed. What actions can I take to prevent falls? General instructions Use good lighting in all rooms. Replace any light bulbs that burn out. Turn on lights if it is dark. Use night-lights. Place frequently used items in easy-to-reach places. Lower the shelves around your home if necessary. Set up furniture so that there are clear paths around it. Avoid moving your furniture around. Remove throw rugs and other tripping hazards from the floor. Avoid walking on wet floors. Fix any uneven floor surfaces. Add color or  contrast paint or tape to grab bars and handrails in your home. Place contrasting color strips on the first and last steps of stairways. When you use a stepladder, make sure that it is completely opened and that the sides are firmly locked. Have someone hold the ladder while you are using it. Do not climb a closed stepladder. Be aware of any and all pets. What can I do in the bathroom?     Keep the floor dry. Immediately clean up any water that spills onto the floor. Remove soap buildup in the tub or shower on a regular basis. Use non-skid mats or decals on the floor of the tub or shower. Attach bath mats securely with double-sided, non-slip rug tape. If you need to sit down while you are in the shower, use a plastic, non-slip stool. Install grab bars by the toilet and in the tub and shower. Do not use towel bars as grab bars. What can I do in the bedroom? Make sure that a bedside light is easy to reach. Do not use oversized bedding that drapes onto the floor. Have a firm chair that has side arms to use for getting dressed. What can I do in the kitchen? Clean up any spills right away. If you need to reach for something above you, use a sturdy step stool that has a grab bar. Keep electrical cables out of the  way. Do not use floor polish or wax that makes floors slippery. If you must use wax, make sure that it is non-skid floor wax. What can I do in the stairways? Do not leave any items on the stairs. Make sure that you have a light switch at the top of the stairs and the bottom of the stairs. Have them installed if you do not have them. Make sure that there are handrails on both sides of the stairs. Fix handrails that are broken or loose. Make sure that handrails are as long as the stairways. Install non-slip stair treads on all stairs in your home. Avoid having throw rugs at the top or bottom of stairways, or secure the rugs with carpet tape to prevent them from moving. Choose a carpet design that does not hide the edge of steps on the stairway. Check any carpeting to make sure that it is firmly attached to the stairs. Fix any carpet that is loose or worn. What can I do on the outside of my home? Use bright outdoor lighting. Regularly repair the edges of walkways and driveways and fix any cracks. Remove high doorway thresholds. Trim any shrubbery on the main path into your home. Regularly check that handrails are securely fastened and in good repair. Both sides of any steps should have handrails. Install guardrails along the edges of any raised decks or porches. Clear walkways of debris and clutter, including tools and rocks. Have leaves, snow, and ice cleared regularly. Use sand or salt on walkways during winter months. In the garage, clean up any spills right away, including grease or oil spills. What other actions can I take? Wear closed-toe shoes that fit well and support your feet. Wear shoes that have rubber soles or low heels. Use mobility aids as needed, such as canes, walkers, scooters, and crutches. Review your medicines with your health care provider. Some medicines can cause dizziness or changes in blood pressure, which increase your risk of falling. Talk with your health care  provider about other ways that you can decrease your risk of falls. This may  include working with a physical therapist or trainer toimprove your strength, balance, and endurance. Where to find more information Centers for Disease Control and Prevention, [place]: WebmailGuide.co.za [employer] on Aging: BrainJudge.co.uk Contact a health care provider if: You are afraid of falling at home. You feel weak, drowsy, or dizzy at home. You fall at home. Summary There are many simple things that you can do to make your home safe and to help prevent falls. Ways to make your home safe include removing tripping hazards and installing grab bars in the bathroom. Ask for help when making these changes in your home. This information is not intended to replace advice given to you by your health care provider. Make sure you discuss any questions you have with your healthcare provider. Document Revised: 07/10/2017 Document Reviewed: 03/12/2017 Elsevier Patient Education  2021 [employer].

## 2021-01-24 NOTE — Chronic Care Management (AMB) (Signed)
Chronic Care Management Pharmacy Note  01/24/2021 Name:  Mary Jordan MRN:  [id number] DOB:  [birth year]   Subjective: Mary Jordan is an 86 y.o. year old female who is a primary patient of Paz, Alda Berthold, MD.  The CCM team was consulted for assistance with disease management and care coordination needs.    Engaged with patient by telephone for follow up visit in response to provider referral for pharmacy case management and/or care coordination services.   Consent to Services:  The patient was given information about Chronic Care Management services, agreed to services, and gave verbal consent prior to initiation of services.  Please see initial visit note for detailed documentation.   Patient Care Team: Colon Branch, MD as PCP - General Joya Gaskins Burnett Harry, MD as Attending Physician (Pulmonary Disease) Parrett, Fonnie Mu, NP as Nurse Practitioner (Pulmonary Disease) Maggie Schwalbe., MD as Consulting Physician (Otolaryngology) Cherre Robins, PharmD (Pharmacist)  Recent office visits: 01/09/2021 - PCP (Dr Larose Kells) F/U chronic conditions; also reported cough x 10 days; COPD exacerbation - prescribed Zithromax, Robitussion DM. 01/01/2021 - PCP office [facility]da [place]) Video visit. seen for acute visit; Pharyngitis; Prescribed amoxicillin 562m tid for 10 days 09/21/2020 - PCP (Dr PLarose Kells f/u chronic conditions. Patient did not start atorvastatin as recommended - afraid of side effects. Continued simvastaitn 138m1/2 tablet daily; trial of Boniva 150 mg monthly; recommended minimize NSAID use - try Tylenol instead.  Recent consult visits: 09/24/2020 - Sports Med (Dr ScRaeford Razorhip impingement syndrome and osteoporosis. Hip pain has improved with hip injections; continue wiht home exericse and consider PT. Ordered DEXA for osteoporosis.  Hospital visits: None in previous 6 months  Objective:  Lab Results  Component Value Date   CREATININE 0.55 01/09/2021   CREATININE 0.62 04/12/2020    CREATININE 0.61 09/23/2019    No results found for: HGBA1C Last diabetic Eye exam: No results found for: HMDIABEYEEXA  Last diabetic Foot exam: No results found for: HMDIABFOOTEX      Component Value Date/Time   CHOL 171 01/09/2021 1056   TRIG 76.0 01/09/2021 1056   HDL 52.60 01/09/2021 1056   CHOLHDL 3 01/09/2021 1056   VLDL 15.2 01/09/2021 1056   LDLCALC 103 (H) 01/09/2021 1056   LDLDIRECT 186.2 08/02/2012 0925    Hepatic Function Latest Ref Rng & Units 01/09/2021 09/23/2019 07/19/2019  Total Protein 6.0 - 8.3 g/dL 6.6 7.5 6.6  Albumin 3.5 - 5.2 g/dL 4.1 4.2 4.3  AST 0 - 37 U/L '18 20 15  ' ALT 0 - 35 U/L '12 19 13  ' Alk Phosphatase 39 - 117 U/L 74 56 61  Total Bilirubin 0.2 - 1.2 mg/dL 0.4 0.6 0.4    Lab Results  Component Value Date/Time   TSH 2.18 11/13/2016 10:53 AM   TSH 1.46 08/02/2012 09:25 AM    CBC Latest Ref Rng & Units 01/09/2021 09/23/2019 07/19/2019  WBC 4.0 - 10.5 K/uL 5.4 8.3 6.7  Hemoglobin 12.0 - 15.0 g/dL 13.6 14.7 15.0  Hematocrit 36.0 - 46.0 % 40.7 46.4(H) 46.3(H)  Platelets 150.0 - 400.0 K/uL 394.0 384 369.0    Lab Results  Component Value Date/Time   VD25OH 71 03/26/2011 09:37 AM   VD25OH 53 11/23/2009 11:26 PM    Clinical ASCVD: No  The ASCVD Risk score (GMikey BussingC Jr., et al., 2013) failed to calculate for the following reasons:   The 2013 ASCVD risk score is only valid for ages 4076o 7923  Other: (CHADS2VASc  if Afib, PHQ9 if depression, MMRC or CAT for COPD, ACT, DEXA)  Social History   Tobacco Use  Smoking Status Former   Packs/day: 1.00   Years: 39.00   Pack years: 39.00   Types: Cigarettes   Quit date: 08/11/1996   Years since quitting: 24.4  Smokeless Tobacco Never  Tobacco Comments       BP Readings from Last 3 Encounters:  01/09/21 (!) 155/91  01/01/21 (!) 149/90  09/24/20 118/82   Pulse Readings from Last 3 Encounters:  01/09/21 88  01/01/21 100  09/21/20 (!) 102   Wt Readings from Last 3 Encounters:  01/09/21 117 lb (53.1  kg)  09/24/20 122 lb (55.3 kg)  09/21/20 122 lb (55.3 kg)    Assessment: Review of patient past medical history, allergies, medications, health status, including review of consultants reports, laboratory and other test data, was performed as part of comprehensive evaluation and provision of chronic care management services.   SDOH:  (Social Determinants of Health) assessments and interventions performed:  SDOH Interventions    Flowsheet Row Most Recent Value  SDOH Interventions   Financial Strain Interventions Intervention Not Indicated  Physical Activity Interventions Intervention Not Indicated       CCM Care Plan  Allergies  Allergen Reactions   Levofloxacin Hives    Medications Reviewed Today     Reviewed by Cherre Robins, PharmD (Pharmacist) on 01/24/21 at 1522  Med List Status: <None>   Medication Order Taking? Sig Documenting Provider Last Dose Status Informant  amLODipine (NORVASC) 5 MG tablet [id number] Yes Take 1 tablet (5 mg total) by mouth daily. Colon Branch, MD Taking Active   clorazepate (TRANXENE) 3.75 MG tablet [id number] Yes Take 1 tablet (3.75 mg total) by mouth at bedtime as needed for sleep. Colon Branch, MD Taking Active   fluticasone [facility]) 50 MCG/ACT nasal spray [id number] Yes TWO SPRAYS EACH NOSTRIL DAILY Rigoberto Noel, MD Taking Active   fluticasone furoate-vilanterol (BREO ELLIPTA) 200-25 MCG/INH AEPB [id number] Yes Inhale 1 puff into the lungs daily. Colon Branch, MD Taking Active   naproxen (NAPROSYN) 500 MG tablet [id number] No Take 1 tablet (500 mg total) by mouth 2 (two) times daily as needed.  Patient not taking: No sig reported   Rosemarie Ax, MD Not Taking Active   simvastatin (ZOCOR) 10 MG tablet [id number] Yes Take 1 tablet (10 mg total) by mouth at bedtime. Colon Branch, MD Taking Active   VENTOLIN HFA 108 [phone]) MCG/ACT inhaler [id number] Yes Inhale 1-2 puffs into the lungs every 6 (six) hours as needed for wheezing or shortness of breath.  Colon Branch, MD Taking Active             Patient Active Problem List   Diagnosis Date Noted   Pharyngitis 01/01/2021   Hip impingement syndrome, left 08/20/2020   Laryngopharyngeal reflux (LPR) 11/26/2016   PCP NOTES >>>> 06/30/2015   COPD (chronic obstructive pulmonary disease) ([place]) 05/31/2015   Hypertension 11/16/2013   Annual physical exam 03/26/2011   CERVICAL RADICULOPATHY, LEFT 10/14/2010   COPD exacerbation ([place]) 09/05/2010   Allergic rhinitis 05/29/2010   ANEMIA, B12 DEFICIENCY 11/05/2007   Dyslipidemia 10/06/2007   DJD (degenerative joint disease) 10/06/2007   Osteoporosis 06/07/2007   INSOMNIA, CHRONIC, MILD 04/05/2007    Immunization History  Administered Date(s) Administered   Influenza Split 05/12/2011, 04/15/2012   Influenza Whole 06/07/2007, 04/28/2008, 05/25/2009, 06/28/2010   Influenza, High Dose Seasonal PF 06/12/2016, 04/18/2017, 03/25/2019   Influenza,inj,Quad  PF,6+ Mos 05/30/2013, 04/20/2014, 05/31/2015   Influenza-Unspecified 04/11/2017, 05/11/2018, 04/10/2020   PFIZER(Purple Top)SARS-COV-2 Vaccination 09/19/2019, 10/10/2019, 05/06/2020, 11/19/2020   Pneumococcal Conjugate-13 04/20/2014   Pneumococcal Polysaccharide-23 04/05/2007, 04/15/2012   Td 04/05/2007   Tdap 08/04/2016   Zoster Recombinat (Shingrix) 01/14/2018, 05/05/2018   Zoster, Live 11/05/2007    Conditions to be addressed/monitored: HTN, HLD, COPD, and osteoporosis; DJD; insomina;   Care Plan : General Pharmacy (Adult)  Updates made by Cherre Robins, PHARMD since 01/24/2021 12:00 AM     Problem: HTN, HLD, COPD, Insomnia   Priority: High  Onset Date: 10/23/2020     Long-Range Goal: Patient-Specific Goal   Start Date: 10/23/2020  Expected End Date: 04/25/2021  This Visit's Progress: Not on track  Recent Progress: On track  Priority: High  Note:   Current Barriers:  Taking amlodipine differently than prescribed due to concerns about low BP Unable to afford or tolerate  pharmacotherapy for osteoporosis  Pharmacist Clinical Goal(s):  Over the next 90 days, patient will verbalize ability to afford treatment regimen maintain control of blood pressure as evidenced by home monitoring  contact provider office for questions/concerns as evidenced notation of same in electronic health record through collaboration with PharmD and provider.   Interventions: 1:1 collaboration with Colon Branch, MD regarding development and update of comprehensive plan of care as evidenced by provider attestation and co-signature Inter-disciplinary care team collaboration (see longitudinal plan of care) Comprehensive medication review performed; medication list updated in electronic medical record  Hypertension (BP goal <140/90) Variable; last 2 office BP readings have been above goal but home BP readings at goal Current treatment: Amlodipine 58m daily PM (Patient taking half tablet = 2.572mtwice a day)  Medications previously tried: none noted  Current home readings: 130/78; 140/90; 125/74; 122/70; 138/78 Denies hypotensive/hypertensive symptoms but she is concerned that taking whole tablet of amlodipine at once could cause low BP Interventions:  Educated on BP goals and benefits of medications for prevention of heart attack, stroke and kidney damage; Recommended continue to check BP 2 to 3 times per week Recommended to continue current medication. Ok to take amlodipine 2.15m9mid as long as she is getting 15mg37mr day  Hyperlipidemia: (LDL goal < 100) Improving; Last LDL was 103 on 01/09/2021 which is down from 136 on 03/21/2020 Improvement likely due to patient taking whole tablet instead of half of simvastatin Current treatment: Simvastatin 10mg67mly  Medications previously tried: prescribed atorvastatin in past but patient did not take due to concern about potential side effects.  Interventions:  Reviewed Cholesterol goals;  If LDL continues to be >100 consider either increase to  simvastatin 20mg 16mhange to rosuvastatin 10mg, 94m consider patients age Recommended to continue current medication  COPD (Goal: control symptoms and prevent exacerbations) Controlled Managed by Dr Alva puElsworth Sohoologist Current treatment  Breo Ellipta 200-215mg 1 16m daily Ventolin HFA 1-2 puffs every 6 hours as needed Medications previously tried: Symbicort Last spirometry score: 4/20/20217 FVC 2.4 (90%) FEV1 1.1 (54%) FEV1/FVC (59%) Exacerbations requiring treatment in last 6 months: one Patient reports daily use of maintenance inhaler Frequency of rescue inhaler use: once per week or less Interventions:  Counseled on Benefits of consistent maintenance inhaler use Discussed when to use rescue inhaler Recommended to continue current medication Patient has reached coverage gap in past with Bre[place] though [place] [facility] [place]. Patient is aware of possible assistance should she reach coverage gap. [place] and will discuss with new PCP if she reaches gap.   Insomnia (Goal: Improved quality sleep) Controlled; patient has taken clorazepate for several years Current treatment  Clorazepate 3.79m daily at bedtime as needed (takes half-tablet)  Medications previously tried: none noted  Interventions:  Recommended to continue current medication  Medication Management:  [place]  She had several questions about what will happen with her medications Interventions:  Explained that [place] [facility] [place] She would like to stay with a small pharmacy that will deliver so looked up a few pharmacies in the area that she could contact - [place] [facility] which is close to her new home.   Patient Goals/Self-Care Activities Over the next 90 days,  patient will:  Take medications as prescribed check blood pressure daily, document, and provide at future appointments collaborate with provider on medication access solutions  Follow Up Plan: No follow up needed as patient will be transferring to a new PCP. She will contact me if she needs assistance with transferring prescriptions.      Medication Assistance:  not currently in coverage gap; has to spend $600 to qualify for assistance for Breo  Patient's preferred pharmacy is:  [facility] - [address] [place] [id number]: [phone]: [phone]  Follow Up:  Patient agrees to Care Plan and Follow-up.  Plan: No further follow up required: patient will be moving in August and will get a new PCP; She is to call me if any pharmacy needs   TCherre Robins PharmD Clinical Pharmacist [phone]

## 2021-01-29 DIAGNOSIS — L720 Epidermal cyst: Secondary | ICD-10-CM | POA: Diagnosis not present

## 2021-03-20 ENCOUNTER — Telehealth: Payer: Self-pay | Admitting: Pharmacist

## 2021-03-20 MED ORDER — CLORAZEPATE DIPOTASSIUM 3.75 MG PO TABS: 3.7500 mg | ORAL_TABLET | Freq: Every evening | ORAL | 2 refills | Status: AC | PRN

## 2021-03-20 NOTE — Telephone Encounter (Signed)
PDMP okay, Rx sent 

## 2021-03-20 NOTE — Telephone Encounter (Signed)
Requesting: clorazepate 3.'75mg'$  Contract: 09/21/2020 UDS: 03/21/2020 Last Visit:01/09/2021 Next Visit: None Last Refill: 12/06/2020 #30 and 2RF  Please Advise

## 2021-03-20 NOTE — Telephone Encounter (Signed)
Patient requesting refill for clorazepate Last fill date 01/24/2021 for 30 day supply Pharmacy: [facility]

## 2021-04-29 ENCOUNTER — Telehealth: Payer: Self-pay

## 2021-04-29 NOTE — Progress Notes (Signed)
    Chronic Care Management Pharmacy Assistant   Name: Mary Jordan  MRN: [id number] DOB: November 04, 1934   Reason for Encounter: Disease State General Assessment   Recent office visits:  None noted.   Recent consult visits:  01/29/21 Dermatology - [place] for epidermal cyst - No other notes available   Hospital visits:  None in previous 6 months  Medications: Outpatient Encounter Medications as of 04/29/2021  Medication Sig   amLODipine (NORVASC) 5 MG tablet Take 1 tablet (5 mg total) by mouth daily. (Patient taking differently: Take 2.5 mg by mouth in the morning and at bedtime.)   clorazepate (TRANXENE) 3.75 MG tablet Take 1 tablet (3.75 mg total) by mouth at bedtime as needed for sleep.   fluticasone (FLONASE) 50 MCG/ACT nasal spray TWO SPRAYS EACH NOSTRIL DAILY   fluticasone furoate-vilanterol (BREO ELLIPTA) 200-25 MCG/INH AEPB Inhale 1 puff into the lungs daily.   naproxen (NAPROSYN) 500 MG tablet Take 1 tablet (500 mg total) by mouth 2 (two) times daily as needed. (Patient not taking: No sig reported)   simvastatin (ZOCOR) 10 MG tablet Take 1 tablet (10 mg total) by mouth at bedtime.   VENTOLIN HFA 108 (90 Base) MCG/ACT inhaler Inhale 1-2 puffs into the lungs every 6 (six) hours as needed for wheezing or shortness of breath.   No facility-administered encounter medications on file as of 04/29/2021.   Have you had any problems recently with your health?   Have you had any problems with your pharmacy?   What issues or side effects are you having with your medications?   What would you like me to pass along to [place] for them to help you with?    What can we do to take care of you better?   Misc. Comment: Attempted to call patient on home phone and discovered that it was disconnected. Reached out on alternate phone number [phone] , which is patient's son, Mary Jordan. He gave me an alternate phone number to reach the patient [phone]. I  attempted to reach the patient and was greeted by a female voice who then hung up. I tried to reconnect in case of accidental disconnection and reached a voicemail prompt that stated the voicemail was not set up. Unable to reach patient at this time to ask if she has moved to [facility], per CPP request. Total time spent for CCM 18. Please add to note if applicable.   Star Rating Drugs: Simvastatin 10 mg last fill 03/20/21 90 DS    Andee Poles, CMA

## 2021-07-23 ENCOUNTER — Other Ambulatory Visit: Payer: Self-pay | Admitting: Pharmacist

## 2021-07-23 MED ORDER — AMLODIPINE BESYLATE 5 MG PO TABS: 5.0000 mg | ORAL_TABLET | Freq: Every day | ORAL | 0 refills | Status: AC

## 2021-07-23 MED ORDER — SIMVASTATIN 10 MG PO TABS: 10.0000 mg | ORAL_TABLET | Freq: Every day | ORAL | 0 refills | Status: AC

## 2021-07-23 NOTE — Telephone Encounter (Signed)
Patient has moved to [facility] in September 2022 but has not established with a physician yet. She his almost out of amlodipine and simvastatin.  She is requesting 1 to 3 months of refills until she can make appointment with new physician.  Forwarding to Dr Larose Kells for review and recommendation.  Also provided numbers for 2 physician practices in her area to call to make appointment.Marland Kitchen

## 2021-07-23 NOTE — Telephone Encounter (Signed)
Rxs sent

## 2021-07-23 NOTE — Telephone Encounter (Signed)
Okay to refill x3 months

## 2021-08-10 IMAGING — DX DG HIP (WITH OR WITHOUT PELVIS) 2-3V*L*
3 series · 3 of 3 positions shown · non-contrast
Comparison: None.

CLINICAL DATA: Left hip pain

EXAM:
DG HIP (WITH OR WITHOUT PELVIS) 2-3V LEFT

[pelvis ap]
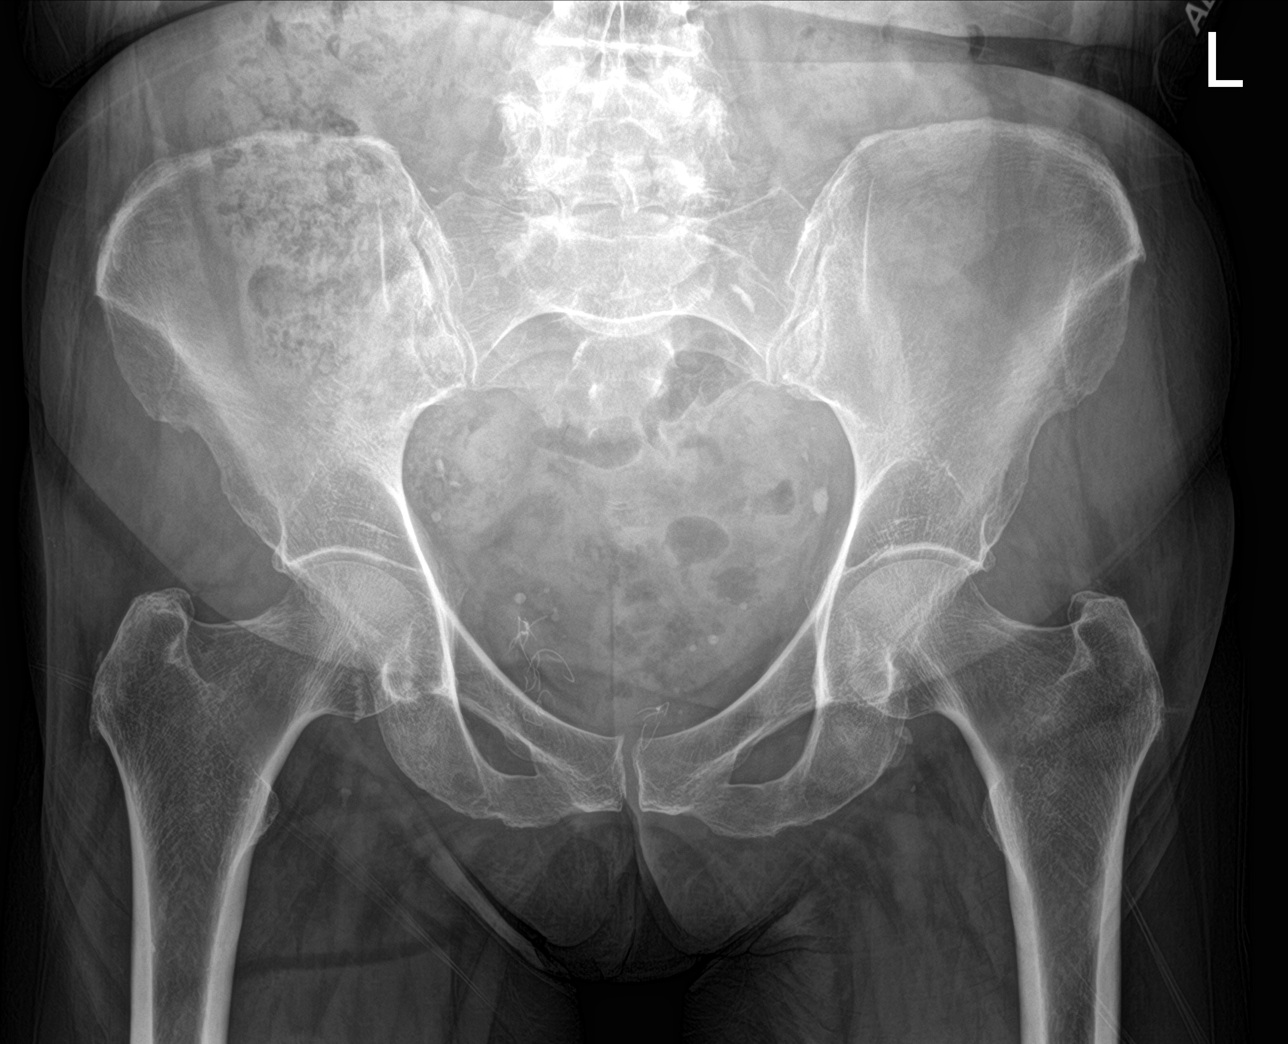

[hip ap]
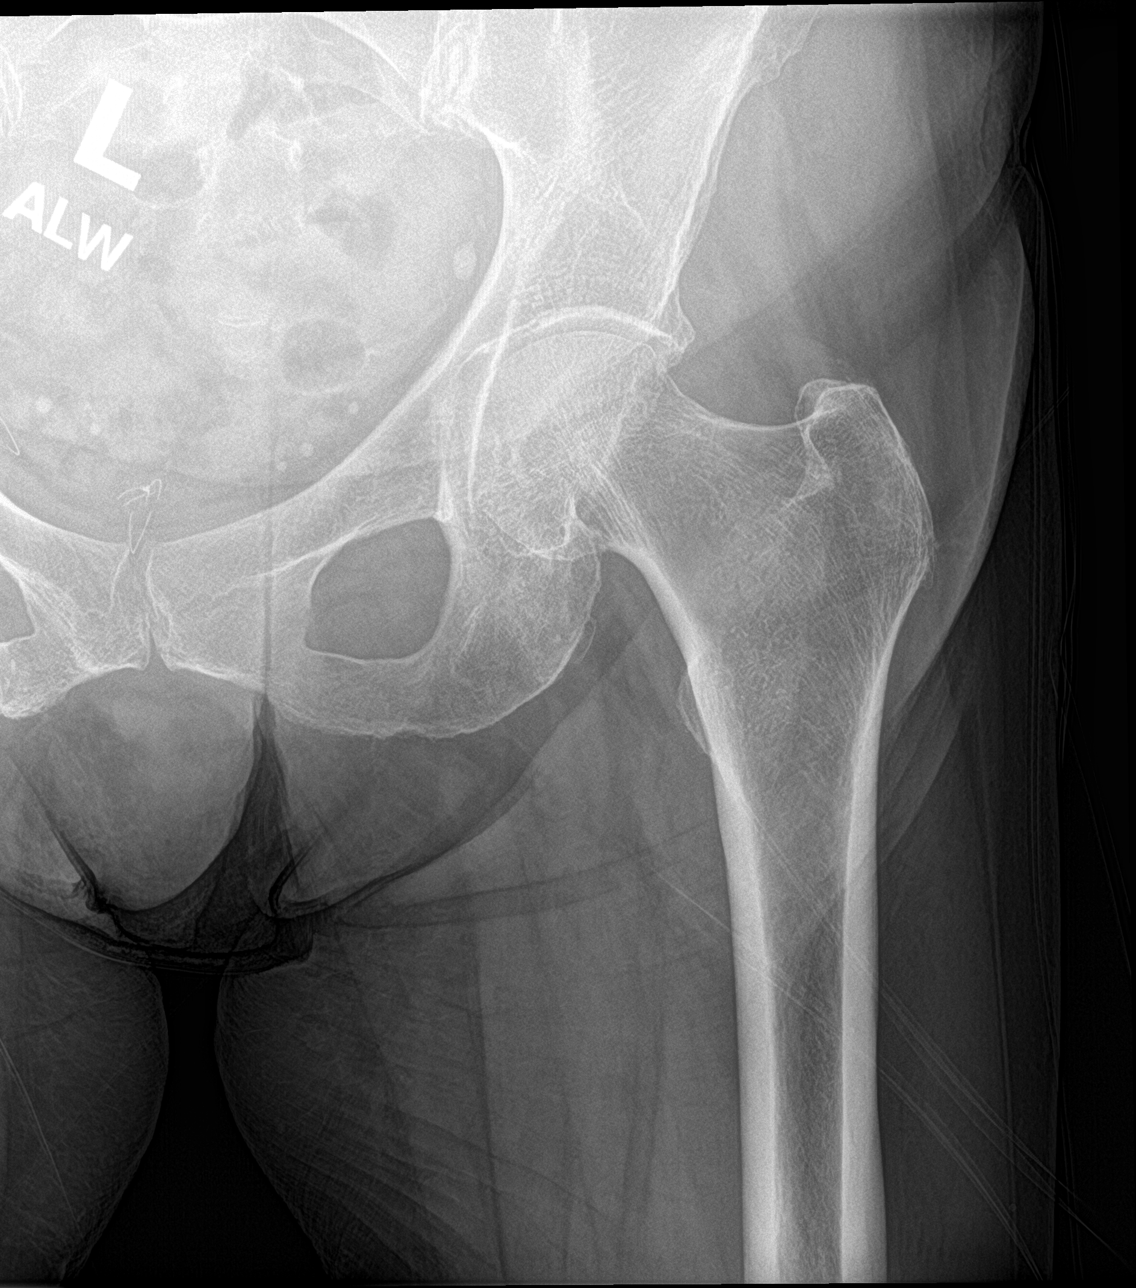

[hip lat]
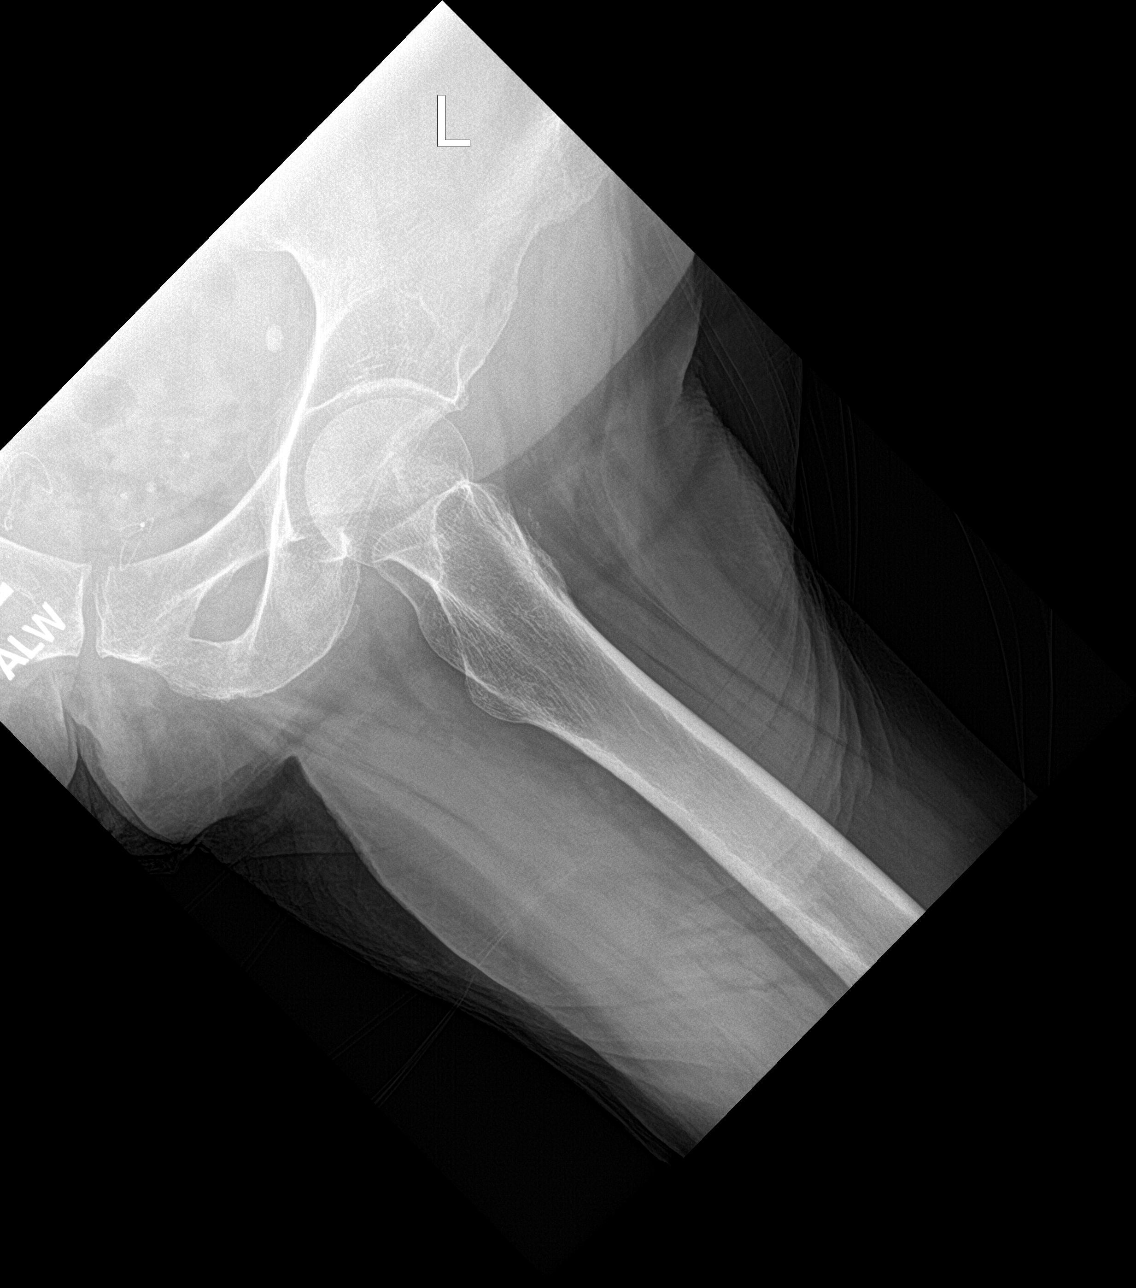

[3 of 3 positions shown; findings below may reference images not displayed]

FINDINGS: No acute fracture or dislocation. No aggressive osseous lesion.
Normal alignment. Generalized osteopenia. Lower lumbar spine
spondylosis.

Soft tissue are unremarkable. No radiopaque foreign body or soft
tissue emphysema.
IMPRESSION: No acute osseous injury of the left hip.

## 2021-09-30 ENCOUNTER — Other Ambulatory Visit: Payer: Self-pay | Admitting: Internal Medicine

## 2021-10-17 ENCOUNTER — Telehealth: Payer: Self-pay | Admitting: Pharmacist

## 2021-10-17 NOTE — Telephone Encounter (Signed)
Patient called to ask for refill for clorazepate (TRANXENE) 3.75 MG tablet.  ?She has moved to [facility] [place] and states she does not have appointment with her new provider until August 2023. However it looks like she saw a [employer] with that practice 09/02/2021 to establish care.  ?Recommended patient call their office for refill - provided number [phone] ? ?See below for last prescription information ?  ?clorazepate (TRANXENE) 3.75 MG tablet [[id number]]  ?  Order Details ?Dose: 3.75 mg Route: Oral Frequency: At bedtime PRN for sleep  ?Dispense Quantity: 30 tablet Refills: 2   ?     ?Sig: Take 1 tablet (3.75 mg total) by mouth at bedtime as needed for sleep.  ?     ?Start Date: 03/20/21 End Date: --  ?Written Date: 03/20/21 Expiration Date: 09/16/21  ? ?Refill history:  ? Dispensed Days Supply Quantity Provider Pharmacy  ?CLORAZEPATE 3.75 MG TABLET 06/21/2021 30 30 each Colon Branch, MD [id number]  ?CLORAZEPATE 3.75 MG TABLET 04/24/2021 30 30 each PColon Branch MD [id number] [place]  ?CLORAZEPATE DIPOTASSIUM 3.75 MG TAB 03/24/2021 30 30 each Paz, JoAlda BertholdMD [facility]  ? ?

## 2022-11-27 ENCOUNTER — Encounter: Payer: Self-pay | Admitting: *Deleted
